# Patient Record
Sex: Female | Born: 1970 | Race: Black or African American | Hispanic: No | Marital: Married | Smoking: Never smoker
Health system: Southern US, Community
[De-identification: ages and names within clinical notes are randomized; demographics above are authoritative.]

## PROBLEM LIST (undated history)

## (undated) DIAGNOSIS — G35 Multiple sclerosis: Secondary | ICD-10-CM

## (undated) DIAGNOSIS — S83106A Unspecified dislocation of unspecified knee, initial encounter: Secondary | ICD-10-CM

## (undated) DIAGNOSIS — S82891A Other fracture of right lower leg, initial encounter for closed fracture: Secondary | ICD-10-CM

## (undated) DIAGNOSIS — Z90710 Acquired absence of both cervix and uterus: Secondary | ICD-10-CM

## (undated) DIAGNOSIS — G43909 Migraine, unspecified, not intractable, without status migrainosus: Secondary | ICD-10-CM

## (undated) HISTORY — DX: Acquired absence of both cervix and uterus: Z90.710

## (undated) HISTORY — DX: Unspecified dislocation of unspecified knee, initial encounter: S83.106A

## (undated) HISTORY — PX: MYOMECTOMY: SHX85

## (undated) HISTORY — DX: Multiple sclerosis: G35

## (undated) HISTORY — PX: ABDOMINAL HYSTERECTOMY: SHX81

## (undated) HISTORY — DX: Other fracture of right lower leg, initial encounter for closed fracture: S82.891A

## (undated) HISTORY — DX: Migraine, unspecified, not intractable, without status migrainosus: G43.909

---

## 2011-08-21 ENCOUNTER — Emergency Department (INDEPENDENT_AMBULATORY_CARE_PROVIDER_SITE_OTHER)
Admission: EM | Admit: 2011-08-21 | Discharge: 2011-08-21 | Disposition: A | Discharge status: Home / Self Care | Payer: Self-pay | Source: Home / Self Care | Attending: Emergency Medicine | Admitting: Emergency Medicine

## 2011-08-21 ENCOUNTER — Encounter (HOSPITAL_COMMUNITY): Payer: Self-pay | Admitting: *Deleted

## 2011-08-21 DIAGNOSIS — S139XXA Sprain of joints and ligaments of unspecified parts of neck, initial encounter: Secondary | ICD-10-CM

## 2011-08-21 DIAGNOSIS — S161XXA Strain of muscle, fascia and tendon at neck level, initial encounter: Secondary | ICD-10-CM

## 2011-08-21 MED ORDER — MELOXICAM 7.5 MG PO TABS: 7.5000 mg | ORAL_TABLET | Freq: Every day | ORAL | Status: AC

## 2011-08-21 MED ORDER — CYCLOBENZAPRINE HCL 10 MG PO TABS: 10.0000 mg | ORAL_TABLET | Freq: Two times a day (BID) | ORAL | Status: AC | PRN

## 2011-08-21 NOTE — Discharge Instructions (Signed)
If pain persists beyond 2 weeks followup with the orthopedic Dr. for guided physical therapy. Start with muscle relaxer as discussed B. precautions for potential side effects as explained   Cervical Sprain A cervical sprain is an injury in the neck in which the ligaments are stretched or torn. The ligaments are the tissues that hold the bones of the neck (vertebrae) in place.Cervical sprains can range from very mild to very severe. Most cervical sprains get better in 1 to 3 weeks, but it depends on the cause and extent of the injury. Severe cervical sprains can cause the neck vertebrae to be unstable. This can lead to damage of the spinal cord and can result in serious nervous system problems. Your caregiver will determine whether your cervical sprain is mild or severe. CAUSES  Severe cervical sprains may be caused by:  Contact sport injuries (football, rugby, wrestling, hockey, auto racing, gymnastics, diving, martial arts, boxing).   Motor vehicle collisions.   Whiplash injuries. This means the neck is forcefully whipped backward and forward.   Falls.  Mild cervical sprains may be caused by:   Awkward positions, such as cradling a telephone between your ear and shoulder.   Sitting in a chair that does not offer proper support.   Working at a poorly Marketing executive station.   Activities that require looking up or down for long periods of time.  SYMPTOMS   Pain, soreness, stiffness, or a burning sensation in the front, back, or sides of the neck. This discomfort may develop immediately after injury or it may develop slowly and not begin for 24 hours or more after an injury.   Pain or tenderness directly in the middle of the back of the neck.   Shoulder or upper back pain.   Limited ability to move the neck.   Headache.   Dizziness.   Weakness, numbness, or tingling in the hands or arms.   Muscle spasms.   Difficulty swallowing or chewing.   Tenderness and swelling of  the neck.  DIAGNOSIS  Most of the time, your caregiver can diagnose this problem by taking your history and doing a physical exam. Your caregiver will ask about any known problems, such as arthritis in the neck or a previous neck injury. X-rays may be taken to find out if there are any other problems, such as problems with the bones of the neck. However, an X-ray often does not reveal the full extent of a cervical sprain. Other tests such as a computed tomography (CT) scan or magnetic resonance imaging (MRI) may be needed. TREATMENT  Treatment depends on the severity of the cervical sprain. Mild sprains can be treated with rest, keeping the neck in place (immobilization), and pain medicines. Severe cervical sprains need immediate immobilization and an appointment with an orthopedist or neurosurgeon. Several treatment options are available to help with pain, muscle spasms, and other symptoms. Your caregiver may prescribe:  Medicines, such as pain relievers, numbing medicines, or muscle relaxants.   Physical therapy. This can include stretching exercises, strengthening exercises, and posture training. Exercises and improved posture can help stabilize the neck, strengthen muscles, and help stop symptoms from returning.   A neck collar to be worn for short periods of time. Often, these collars are worn for comfort. However, certain collars may be worn to protect the neck and prevent further worsening of a serious cervical sprain.  HOME CARE INSTRUCTIONS   Put ice on the injured area.   Put ice in a plastic  bag.   Place a towel between your skin and the bag.   Leave the ice on for 15 to 20 minutes, 3 to 4 times a day.   Only take over-the-counter or prescription medicines for pain, discomfort, or fever as directed by your caregiver.   Keep all follow-up appointments as directed by your caregiver.   Keep all physical therapy appointments as directed by your caregiver.   If a neck collar is  prescribed, wear it as directed by your caregiver.   Do not drive while wearing a neck collar.   Make any needed adjustments to your work station to promote good posture.   Avoid positions and activities that make your symptoms worse.   Warm up and stretch before being active to help prevent problems.  SEEK MEDICAL CARE IF:   Your pain is not controlled with medicine.   You are unable to decrease your pain medicine over time as planned.   Your activity level is not improving as expected.  SEEK IMMEDIATE MEDICAL CARE IF:   You develop any bleeding, stomach upset, or signs of an allergic reaction to your medicine.   Your symptoms get worse.   You develop new, unexplained symptoms.   You have numbness, tingling, weakness, or paralysis in any part of your body.  MAKE SURE YOU:   Understand these instructions.   Will watch your condition.   Will get help right away if you are not doing well or get worse.  Document Released: 01/27/2007 Document Revised: 03/21/2011 Document Reviewed: 01/02/2011 [facility] Patient Information 2012 [place], [place].Ligament Sprain Ligaments are tough, fibrous tissues that hold bones together at the joints. A sprain can occur when a ligament is stretched. This injury may take several weeks to heal. HOME CARE INSTRUCTIONS   Rest the injured area for as long as directed by your caregiver. Then slowly start using the joint as directed by your caregiver and as the pain allows.   Keep the affected joint raised if possible to lessen swelling.   Apply ice for 15 to 20 minutes to the injured area every couple hours for the first half day, then 3 to 4 times per day for the first 48 hours. Put the ice in a plastic bag and place a towel between the bag of ice and your skin.   Wear any splinting, casting, or elastic bandage applications as instructed.   Only take over-the-counter or prescription medicines for pain, discomfort, or fever as directed by your  caregiver. Do not use aspirin immediately after the injury unless instructed by your caregiver. Aspirin can cause increased bleeding and bruising of the tissues.   If you were given crutches, continue to use them as instructed and do not resume weight bearing on the affected extremity until instructed.  SEEK MEDICAL CARE IF:   Your bruising, swelling, or pain increases.   You have cold and numb fingers or toes if your arm or leg was injured.  SEEK IMMEDIATE MEDICAL CARE IF:   Your toes are numb or blue if your leg was injured.   Your fingers are numb or blue if your arm was injured.   Your pain is not responding to medicines and continues to stay the same or gets worse.  MAKE SURE YOU:   Understand these instructions.   Will watch your condition.   Will get help right away if you are not doing well or get worse.  Document Released: 03/29/2000 Document Revised: 03/21/2011 Document Reviewed: 01/26/2008 [employer] Patient Information  [address] [place].
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
## 2012-01-02 ENCOUNTER — Encounter: Payer: Self-pay | Admitting: Obstetrics & Gynecology

## 2012-01-02 ENCOUNTER — Ambulatory Visit (INDEPENDENT_AMBULATORY_CARE_PROVIDER_SITE_OTHER): Payer: Self-pay | Admitting: Obstetrics & Gynecology

## 2012-01-02 VITALS — BP 135/87 | HR 66 | Temp 98.0°F | Ht 66.0 in | Wt 190.5 lb

## 2012-01-02 DIAGNOSIS — N949 Unspecified condition associated with female genital organs and menstrual cycle: Secondary | ICD-10-CM

## 2012-01-02 DIAGNOSIS — R102 Pelvic and perineal pain: Secondary | ICD-10-CM

## 2012-01-02 NOTE — Progress Notes (Signed)
Patient ID: Dana Duke, female   DOB: 04-18-1970, 41 y.o.   MRN: [id number]  CC: pelvic pain  HPI:      Patient presents c/o left sided pelvic pain that has been occuring over the past 3 months.  States that she occasionally experiences a sharp pain in her left pelvic region.  Pain is intermittent, episodes occuring variably and lasting about 10 minutes in duration.  Patient states that the pain does not occur with anything in particular but sometimes she does experience dyspareunia.  Patient has history of hysterectomy as well as significant history of abdominal surgery.  Patient also has had a few UTIs over the past few months.  Currently she is experiencing increase in urinary frequency but no dysuria, or foul-smelling urine.  Has had one episode of hematuria about 1 month ago.  Nothing since.  No history of hematuria mentioned.  Denies any recent fever, illness, or unexplained change in weight.  No change to bowel habitus.  PMH:    History reviewed. No pertinent past medical history.  MEDS:   Current outpatient prescriptions:HYDROcodone-acetaminophen (NORCO) 7.5-325 MG per tablet, Take 1 tablet by mouth every 6 (six) hours as needed., Disp: , Rfl: ;  meloxicam (MOBIC) 7.5 MG tablet, Take 1 tablet (7.5 mg total) by mouth daily., Disp: 14 tablet, Rfl: 0  Allergies:  No Known Allergies  ROS: See HPI; all other ROS are negative   Physical Exam  BP 135/87  Pulse 66  Temp 98 F (36.7 C)  Ht 5\' 6"  (1.676 m)  Wt 86.41 kg (190 lb 8 oz)  BMI 30.75 kg/m2  General - WD, WN AA female in NAD sitting comfortably on examination table  Heart - RRR; S1/S2 distinct without murmur; no S3/S4; no rubs, clicks or gallops  Lungs - CTAB; no use of accessory muscles noted  Abdomen - BS +; S/NT/ND Pelvic - no masses noted with bimanual examination.  Moderate tenderness elicited with examination.  Labs:  -- Urinalysis  Assessment/Plan:  -- Pelvic Pain        -- no anomalies found on examination.   Pain could be due to repeat UTI or secondary to adhesions given surgical history and scarring.  Will follow-up with [facility] for UTI.  If pain continues or worsens, further workup may be needed.  -- Patient to return to clinic if symptoms change or worsen.   Attestation of Attending Supervision of Physician Assistant Student: Evaluation and management procedures were performed by the PA Student under my supervision.  I have seen and examined the patient, reviewed the the student's note and chart, and I agree with management and plan.  Jaynie Collins, MD, FACOG Attending Obstetrician & Gynecologist Faculty Practice, [facility]

## 2012-01-02 NOTE — Patient Instructions (Signed)
Return to clinic for any scheduled appointments or for any gynecologic concerns as needed.   

## 2012-01-03 ENCOUNTER — Other Ambulatory Visit: Payer: Self-pay | Admitting: Medical

## 2012-01-03 DIAGNOSIS — N390 Urinary tract infection, site not specified: Secondary | ICD-10-CM

## 2012-01-03 LAB — POCT URINALYSIS DIP (DEVICE)
Glucose, UA: NEGATIVE mg/dL
Nitrite: NEGATIVE
Specific Gravity, Urine: 1.01 (ref 1.005–1.030)
Urobilinogen, UA: 0.2 mg/dL (ref 0.0–1.0)

## 2012-01-03 MED ORDER — SULFAMETHOXAZOLE-TRIMETHOPRIM 800-160 MG PO TABS: 1.0000 | ORAL_TABLET | Freq: Two times a day (BID) | ORAL | Status: DC

## 2012-01-31 ENCOUNTER — Other Ambulatory Visit (HOSPITAL_COMMUNITY): Payer: Self-pay | Admitting: Internal Medicine

## 2012-01-31 DIAGNOSIS — Z1231 Encounter for screening mammogram for malignant neoplasm of breast: Secondary | ICD-10-CM

## 2012-02-17 ENCOUNTER — Ambulatory Visit (HOSPITAL_COMMUNITY)
Admission: RE | Admit: 2012-02-17 | Discharge: 2012-02-17 | Disposition: A | Discharge status: Home / Self Care | Payer: Self-pay | Source: Ambulatory Visit | Attending: Internal Medicine | Admitting: Internal Medicine

## 2012-02-17 DIAGNOSIS — Z1231 Encounter for screening mammogram for malignant neoplasm of breast: Secondary | ICD-10-CM

## 2013-02-19 ENCOUNTER — Encounter: Payer: Self-pay | Admitting: Neurology

## 2013-02-22 ENCOUNTER — Encounter (INDEPENDENT_AMBULATORY_CARE_PROVIDER_SITE_OTHER): Payer: Self-pay

## 2013-02-22 ENCOUNTER — Ambulatory Visit (INDEPENDENT_AMBULATORY_CARE_PROVIDER_SITE_OTHER): Payer: BC Managed Care – PPO | Admitting: Neurology

## 2013-02-22 ENCOUNTER — Encounter: Payer: Self-pay | Admitting: Neurology

## 2013-02-22 VITALS — BP 122/81 | HR 81 | Ht 66.0 in | Wt 177.0 lb

## 2013-02-22 DIAGNOSIS — R269 Unspecified abnormalities of gait and mobility: Secondary | ICD-10-CM
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
## 2013-05-05 ENCOUNTER — Ambulatory Visit: Payer: BC Managed Care – PPO | Admitting: Neurology

## 2013-06-16 ENCOUNTER — Ambulatory Visit: Payer: BC Managed Care – PPO | Admitting: Neurology

## 2013-08-18 ENCOUNTER — Other Ambulatory Visit: Payer: 59

## 2013-08-19 ENCOUNTER — Telehealth: Payer: Self-pay | Admitting: Neurology

## 2013-08-19 ENCOUNTER — Ambulatory Visit (INDEPENDENT_AMBULATORY_CARE_PROVIDER_SITE_OTHER): Payer: 59

## 2013-08-19 DIAGNOSIS — R269 Unspecified abnormalities of gait and mobility: Secondary | ICD-10-CM

## 2013-08-19 NOTE — Telephone Encounter (Signed)
Patient at check out today states that she would like to know the results of her MRI today before she goes out of town next Wednesday (she will be gone for about a month or more). Please call and advise patient.

## 2013-08-20 NOTE — Telephone Encounter (Signed)
Scheduled for f/u appt, informed patient that results were not ready as of yet, she verbalized understanding and confirmed appt

## 2013-08-23 ENCOUNTER — Ambulatory Visit (INDEPENDENT_AMBULATORY_CARE_PROVIDER_SITE_OTHER): Payer: 59

## 2013-08-23 ENCOUNTER — Encounter: Payer: Self-pay | Admitting: Neurology

## 2013-08-23 ENCOUNTER — Ambulatory Visit (INDEPENDENT_AMBULATORY_CARE_PROVIDER_SITE_OTHER): Payer: 59 | Admitting: Neurology

## 2013-08-23 ENCOUNTER — Telehealth: Payer: Self-pay | Admitting: Neurology

## 2013-08-23 VITALS — BP 188/73 | HR 68 | Ht 66.0 in | Wt 189.0 lb

## 2013-08-23 DIAGNOSIS — G35 Multiple sclerosis: Secondary | ICD-10-CM

## 2013-08-23 NOTE — Progress Notes (Signed)
[facility]  PATIENT: Dana Duke DOB: 11/19/1970  HISTORICAL (initial visit was in Nov 2014) Dana Duke is a 43 years old right-handed African American female, referred by Dr. Suann Larryevesahwar, Janalyn RouseShaili for evaluation of gait difficulties,  She had past medical history of hyperlipidemia, over the past 3 years, since 2011 she noticed gradual onset still worsening gait difficulty, she tends to drag her foot across the floor, stiff, has difficulty going upstairs, because of knee pain, she suffered a history of right ankle fracture in 2010, seems to dragging her right foot across the floor more,  She has bilateral knee pain, hip pain, but she denies significant low back pain, no neck pain, she has urinary urgency, no bowel incontinence  UPDATE May 11th 2015:  She was delayed in getting her MRI since her last visit in November 2014, she continued to complains of slow worsening gait difficulty, urinary urgency, bilateral blurry vision  We have reviewed the MRI together which was done at [facility] in May 7th 2015, without contrast  Abnormal MRI cervical spine (without) demonstrating at least 4 spinal cord T2 hyperintensities at cervico-medullary junction, C2, C3-4 and C6. In combination with MRI brain findings, these most likely represent demyelinating plaques  MRI brain (without) demonstrating multiple (> 20) round and ovoid, periventricular, callosal and peri-callosal, subcortical, juxtacortical, pontine and cerebellar T2 hyperintensities. Some of these are perpendicular to the lateral ventricles on axial and sagittal views. Some of these are hypointense on T1 views. Findings suspicious for demyelinating disease  The findings, and progressive worsening neurological complaints, are most consistent with multiple sclerosis, Most likely relapsing remitting   REVIEW OF SYSTEMS: Full 14 system review of systems performed and notable only for joint pain, headache, numbness,  weakness, restless leg decreased energy,  ALLERGIES: No Known Allergies  HOME MEDICATIONS: Outpatient Prescriptions Prior to Visit  Medication Sig Dispense Refill  . Cholecalciferol (VITAMIN D PO) Take by mouth.      . DICLOFENAC PO Take by mouth.      Marland Kitchen. HYDROcodone-acetaminophen (NORCO) 7.5-325 MG per tablet Take 1 tablet by mouth every 6 (six) hours as needed.      . sulfamethoxazole-trimethoprim (BACTRIM DS,SEPTRA DS) 800-160 MG per tablet Take 1 tablet by mouth 2 (two) times daily.  10 tablet  0  . Vitamin D, Ergocalciferol, (DRISDOL) 50000 UNITS CAPS capsule        No facility-administered medications prior to visit.    PAST MEDICAL HISTORY: Past Medical History  Diagnosis Date  . Migraines   . Ankle fracture, right   . Knee joint dislocation     bilateral  . History of hysterectomy     PAST SURGICAL HISTORY: Past Surgical History  Procedure Laterality Date  . Abdominal hysterectomy    . Myomectomy    . Cesarean section      FAMILY HISTORY: Family History  Problem Relation Age of Onset  . Diabetes Mother   . Osteoarthritis    . Heart disease    . Rheumatic fever    . Hypertension      SOCIAL HISTORY:  History   Social History  . Marital Status: Married    Spouse Name: Loraine LericheMark    Number of Children: 1  . Years of Education: college   Occupational History  .      Does not work   Social History Main Topics  . Smoking status: Never Smoker   . Smokeless tobacco: Never Used  . Alcohol Use: 0.6 oz/week  1 Glasses of wine per week     Comment: On Holidays  . Drug Use: No  . Sexual Activity: Yes    Birth Control/ Protection: Surgical   Other Topics Concern  . Not on file   Social History Narrative   Patient is married. [name]).    Patient is unemployed.   Education- College education   Right handed.   Caffeine- None     PHYSICAL EXAM   Filed Vitals:   08/23/13 1135  BP: 188/73  Pulse: 68  Height: 5\' 6"  (1.676 m)  Weight: 189 lb (85.73  kg)   Body mass index is 30.52 kg/(m^2).   Generalized: In no acute distress  Neck: Supple, no carotid bruits   Cardiac: Regular rate rhythm  Pulmonary: Clear to auscultation bilaterally  Musculoskeletal: No deformity  Neurological examination  Mentation: Alert oriented to time, place, history taking, and causual conversation  Cranial nerve II-XII: Pupils were equal round reactive to light extraocular movements were full, visual field were full on confrontational test. facial sensation and strength were normal. hearing was intact to finger rubbing bilaterally. Uvula tongue midline.  head turning and shoulder shrug and were normal and symmetric.Tongue protrusion into cheek strength was normal.  Motor: Moderate bilateral lower extremity spasticity, left hip flexion 4 knee flexion 4 plus, .  Sensory: Intact to fine touch, pinprick, preserved vibratory sensation, and proprioception at toes.  Coordination: Normal finger to nose, heel-to-shin bilaterally there was no truncal ataxia  Gait: Rising up from seated position without assistance, normal stance, mild wide based stiff gait, cautious, mild difficulty with tiptoe, heel and tandem walking Romberg signs: Negative  Deep tendon reflexes: Brachioradialis 2/2, biceps 2/2, triceps 2/2, patellar 3/3, Achilles 2/2, plantar responses were flexor bilaterally.   DIAGNOSTIC DATA (LABS, IMAGING, TESTING) - I reviewed patient records, labs, notes, testing and imaging myself where available.  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 Visual evoked  potential 5, return to clinic in one mont   Levert Feinstein, M.D. Ph.D.  [facility] [address] [place], [place] [address] [phone]

## 2013-08-23 NOTE — Procedures (Signed)
    History:   Denna HaggardDesrene Erichsen is a 43 year old patient with a progressive history of a gait disturbance. There is a history of an abnormal MRI the brain, and the patient being evaluated for possible demyelinating disease.  Description: The visual evoked response test was performed today using 32 x 32 check sizes. The absolute latencies for the N1 and the P100 wave forms were within normal limits on the left, with prolongation of the P100 latency on the right. The right N1 waveform is of normal latency. The amplitudes for the P100 wave forms were within normal limits bilaterally. The visual acuity was 20/30 OD and 20/30 OS corrected.  Impression:  The visual evoked response test above was within normal limits on the left, with prolongation of the P100 latency on the right. This study suggests conduction slowing in the anterior visual pathways on the right, and is consistent with an ischemic or demyelinating optic neuropathy. Clinical correlation is required.

## 2013-08-23 NOTE — Telephone Encounter (Signed)
Patient wants to know if she should still be seeing her Rheumatologist now that she has been diagnosed. Pt states she is leaving the country Wed. 5/13 and would like an answer before than.

## 2013-08-25 LAB — CBC WITH DIFFERENTIAL
Basophils Absolute: 0 10*3/uL (ref 0.0–0.2)
Basos: 0 %
EOS: 1 %
Eosinophils Absolute: 0.1 10*3/uL (ref 0.0–0.4)
HCT: 38.4 % (ref 34.0–46.6)
Hemoglobin: 12.3 g/dL (ref 11.1–15.9)
IMMATURE GRANS (ABS): 0 10*3/uL (ref 0.0–0.1)
IMMATURE GRANULOCYTES: 0 %
Lymphocytes Absolute: 2.2 10*3/uL (ref 0.7–3.1)
Lymphs: 31 %
MCH: 27.8 pg (ref 26.6–33.0)
MCHC: 32 g/dL (ref 31.5–35.7)
MCV: 87 fL (ref 79–97)
MONOCYTES: 5 %
Monocytes Absolute: 0.3 10*3/uL (ref 0.1–0.9)
NEUTROS PCT: 63 %
Neutrophils Absolute: 4.3 10*3/uL (ref 1.4–7.0)
PLATELETS: 275 10*3/uL (ref 150–379)
RBC: 4.42 x10E6/uL (ref 3.77–5.28)
RDW: 14.9 % (ref 12.3–15.4)
WBC: 6.9 10*3/uL (ref 3.4–10.8)

## 2013-08-25 LAB — COMPREHENSIVE METABOLIC PANEL
A/G RATIO: 1.4 (ref 1.1–2.5)
ALBUMIN: 4.3 g/dL (ref 3.5–5.5)
ALT: 10 IU/L (ref 0–32)
AST: 14 IU/L (ref 0–40)
Alkaline Phosphatase: 43 IU/L (ref 39–117)
BILIRUBIN TOTAL: 0.2 mg/dL (ref 0.0–1.2)
BUN/Creatinine Ratio: 16 (ref 9–23)
BUN: 11 mg/dL (ref 6–24)
CO2: 20 mmol/L (ref 18–29)
CREATININE: 0.68 mg/dL (ref 0.57–1.00)
Calcium: 9.2 mg/dL (ref 8.7–10.2)
Chloride: 100 mmol/L (ref 97–108)
GFR, EST AFRICAN AMERICAN: 123 mL/min/{1.73_m2} (ref >59)
GFR, EST NON AFRICAN AMERICAN: 107 mL/min/{1.73_m2} (ref >59)
GLOBULIN, TOTAL: 3 g/dL (ref 1.5–4.5)
GLUCOSE: 95 mg/dL (ref 65–99)
Potassium: 4.2 mmol/L (ref 3.5–5.2)
Sodium: 137 mmol/L (ref 134–144)
TOTAL PROTEIN: 7.3 g/dL (ref 6.0–8.5)

## 2013-08-25 LAB — IFE AND PE, SERUM
ALBUMIN SERPL ELPH-MCNC: 3.8 g/dL (ref 3.2–5.6)
ALPHA 1: 0.2 g/dL (ref 0.1–0.4)
ALPHA2 GLOB SERPL ELPH-MCNC: 0.6 g/dL (ref 0.4–1.2)
Albumin/Glob SerPl: 1.1 (ref 0.7–2.0)
B-Globulin SerPl Elph-Mcnc: 1 g/dL (ref 0.6–1.3)
GAMMA GLOB SERPL ELPH-MCNC: 1.7 g/dL — AB (ref 0.5–1.6)
Globulin, Total: 3.5 g/dL (ref 2.0–4.5)
IGG (IMMUNOGLOBIN G), SERUM: 1719 mg/dL — AB (ref 700–1600)
IgA/Immunoglobulin A, Serum: 203 mg/dL (ref 91–414)
IgM (Immunoglobulin M), Srm: 265 mg/dL — ABNORMAL HIGH (ref 40–230)

## 2013-08-25 LAB — SEDIMENTATION RATE: Sed Rate: 16 mm/hr (ref 0–32)

## 2013-08-25 LAB — FOLATE: Folate: 18.7 ng/mL (ref >3.0)

## 2013-08-25 LAB — THYROID PANEL WITH TSH
FREE THYROXINE INDEX: 1.8 (ref 1.2–4.9)
T3 UPTAKE RATIO: 32 % (ref 24–39)
T4 TOTAL: 5.6 ug/dL (ref 4.5–12.0)
TSH: 1.04 u[IU]/mL (ref 0.450–4.500)

## 2013-08-25 LAB — CK: Total CK: 259 U/L — ABNORMAL HIGH (ref 24–173)

## 2013-08-25 LAB — NMO IGG AUTOANTIBODIES: NMO-IgG: <1.6 U/mL (ref 0.0–3.0)

## 2013-08-25 LAB — LYME, TOTAL AB TEST/REFLEX: Lyme IgG/IgM Ab: <0.91 {ISR} (ref 0.00–0.90)

## 2013-08-25 LAB — VITAMIN B12: Vitamin B-12: 314 pg/mL (ref 211–946)

## 2013-08-25 LAB — HIV ANTIBODY (ROUTINE TESTING W REFLEX): HIV-1/HIV-2 Ab: NONREACTIVE

## 2013-08-25 LAB — C-REACTIVE PROTEIN: CRP: 0.8 mg/L (ref 0.0–4.9)

## 2013-08-25 LAB — RPR: RPR: NONREACTIVE

## 2013-08-25 LAB — COPPER, SERUM: COPPER: 95 ug/dL (ref 72–166)

## 2013-08-25 LAB — VARICELLA ZOSTER ANTIBODY, IGG: Varicella zoster IgG: 534 index (ref >165)

## 2013-08-25 LAB — ANA W/REFLEX IF POSITIVE: ANA: NEGATIVE

## 2013-08-27 NOTE — Telephone Encounter (Signed)
Dana:  Please call patient it is ok to postphone Rheumatology appt, but she needs to come back with me to review MRI together, within 2-4 weeks.

## 2013-08-31 NOTE — Telephone Encounter (Signed)
Called and spoke to patients daughter . Patient's daughter stated her mother is out of the country until the end of June. Patient daughter will deliver message. About Rheumatology appt. Marland Kitchen

## 2013-09-02 ENCOUNTER — Telehealth: Payer: Self-pay | Admitting: Neurology

## 2013-09-02 NOTE — Telephone Encounter (Signed)
Dana: Please make sure, that she has a followup appointment with us at MRI, and CSF study in one to 2 weeks

## 2013-09-03 NOTE — Telephone Encounter (Signed)
Dr.Yan Patient is out of the country until being ing of July. Spoke to patients husband and he will have her call to schedule follow up in July with you.

## 2013-09-29 ENCOUNTER — Telehealth: Payer: Self-pay | Admitting: *Deleted

## 2013-09-30 NOTE — Telephone Encounter (Signed)
Pt's information was given to Annabelle Harman, [place], Dr. Zannie Cove assistant.

## 2013-10-04 ENCOUNTER — Telehealth: Payer: Self-pay

## 2013-10-04 NOTE — Telephone Encounter (Signed)
Patient has been out of the Country when Dr.Yan wanted her to Have Lumbar Puncture. Patient is back and ready to schedule. Sent order's and notes to [facility].

## 2013-10-04 NOTE — Telephone Encounter (Signed)
Patient has been out of the country and she is ready to schedule LP.

## 2013-10-06 ENCOUNTER — Ambulatory Visit
Admission: RE | Admit: 2013-10-06 | Discharge: 2013-10-06 | Disposition: A | Discharge status: Home / Self Care | Payer: 59 | Source: Ambulatory Visit | Attending: Neurology | Admitting: Neurology

## 2013-10-06 VITALS — BP 102/50 | HR 55

## 2013-10-06 DIAGNOSIS — R269 Unspecified abnormalities of gait and mobility: Secondary | ICD-10-CM

## 2013-10-06 DIAGNOSIS — G35 Multiple sclerosis: Secondary | ICD-10-CM

## 2013-10-06 LAB — GRAM STAIN
GRAM STAIN: NONE SEEN
Gram Stain: NONE SEEN

## 2013-10-06 NOTE — Discharge Instructions (Signed)

## 2013-10-06 NOTE — Progress Notes (Addendum)
Blood drawn from left AC. Site is unremarkable and pt tolerated procedure well. Discharge instructions explained to pt.

## 2013-10-08 ENCOUNTER — Telehealth: Payer: Self-pay | Admitting: Neurology

## 2013-10-08 LAB — CSF PANEL 1
GLUCOSE CSF: 78 mg/dL — AB (ref 43–76)
RBC Count, CSF: 0 cu mm
Total Protein, CSF: 36 mg/dL (ref 15–45)
Tube #: 4
VDRL Quant, CSF: NONREACTIVE
WBC, CSF: 2 cu mm (ref 0–5)

## 2013-10-08 NOTE — Telephone Encounter (Signed)
Dana Duke, please give her a follow up visit in my next available, make sure appt is after all the workups that was planned during last visit.

## 2013-10-10 LAB — MULTIPLE SCLEROSIS PANEL 2
ALBUMIN MSPROF: 4130 mg/dL (ref 3700–5410)
Albumin CSF: 17 mg/dL (ref <35)
Albumin Index: 4.1 (ref <9.0)
CNS-IgG Synthesis Rate: 11 mg/24hr — ABNORMAL HIGH (ref <3.3)
IgA CSF: 0.17 mg/dL (ref 0.15–0.60)
IgA Total: 191 mg/dL (ref 81–463)
IgG Total CSF: 6.7 mg/dL — ABNORMAL HIGH (ref 0.5–6.1)
IgG Total: 1722 mg/dL — ABNORMAL HIGH (ref 694–1618)
IgG-Index: 0.95 — ABNORMAL HIGH (ref <0.70)
IgG: 2.01 mg/dL — ABNORMAL HIGH (ref <0.10)
IgM MSPROF: <0.001 mg/dL (ref <0.010)
IgM Total: 261 mg/dL (ref 48–271)
IgM-CSF: 0.15 mg/dL — ABNORMAL HIGH (ref <0.10)

## 2013-10-11 NOTE — Telephone Encounter (Signed)
Because of patients insurance she has to get from her PCP for MRI's Dana Duke is handling this with patient.

## 2013-10-11 NOTE — Telephone Encounter (Signed)
Dr.Yan she has had all her test done. But her MRI and because the way her insurance is saying her PCP has to be the one to order MRI Luster LandsbergRenee is aware this is going on. Do you want me to schedule follow up any way even though she has not had MRI yet ? Please advise.

## 2013-10-11 NOTE — Telephone Encounter (Signed)
Abnormal CSF. Dana Duke, please make sure that she has a follow up appt with me, will go over results at her follow up visit.

## 2013-10-12 NOTE — Telephone Encounter (Signed)
Yes, let her come back for follow up with out MRI done. In my next available.

## 2013-10-13 NOTE — Telephone Encounter (Signed)
Called and spoke to patient. Patient has a follow up appt. With Dr.Yan Next week.

## 2013-10-18 ENCOUNTER — Encounter: Payer: Self-pay | Admitting: Neurology

## 2013-10-18 ENCOUNTER — Ambulatory Visit (INDEPENDENT_AMBULATORY_CARE_PROVIDER_SITE_OTHER): Payer: 59 | Admitting: *Deleted

## 2013-10-18 ENCOUNTER — Ambulatory Visit (INDEPENDENT_AMBULATORY_CARE_PROVIDER_SITE_OTHER): Payer: 59 | Admitting: Neurology

## 2013-10-18 VITALS — BP 124/76 | HR 69 | Ht 65.0 in | Wt 190.0 lb

## 2013-10-18 DIAGNOSIS — G35 Multiple sclerosis: Secondary | ICD-10-CM

## 2013-10-18 DIAGNOSIS — R269 Unspecified abnormalities of gait and mobility: Secondary | ICD-10-CM

## 2013-10-18 NOTE — Patient Instructions (Signed)
Dana Duke

## 2013-10-18 NOTE — Progress Notes (Signed)
Pt taken to lab area.  Instructed that Dr. Terrace Arabia wanting JCV lab, (did not get last time she was in May/2015).  Under aseptic technique 23g butterfly inserted and blood drawn to appropriate tube.  Needle withdrawn, pressure applied and bandage applied.   Pt ok, and proceeded to check out.

## 2013-10-18 NOTE — Progress Notes (Addendum)
[facility]  PATIENT: Dana Duke DOB: 12/13/1970  HISTORICAL (initial visit was in Nov 2014) Dana Duke is a 43 years old right-handed African American female, referred by Dr. Suann Larry, Janalyn Rouse for evaluation of gait difficulties,  She had past medical history of hyperlipidemia, over the past 3 years, since 2011 she noticed gradual onset still worsening gait difficulty, she tends to drag her foot across the floor, stiff, has difficulty going upstairs, because of knee pain, she suffered a history of right ankle fracture in 2010, seems to dragging her right foot across the floor more,  She has bilateral knee pain, hip pain, but she denies significant low back pain, no neck pain, she has urinary urgency, no bowel incontinence  UPDATE May 11th 2015:  She was delayed in getting her MRI, since her last visit in November 2014, she continued to complains of slow worsening gait difficulty, urinary urgency, bilateral blurry vision  We have reviewed the MRI together which was done at [facility] in May 7th 2015, without contrast  Abnormal MRI cervical spine (without) demonstrating at least 4 spinal cord T2 hyperintensities at cervico-medullary junction, C2, C3-4 and C6. In combination with MRI brain findings, these most likely represent demyelinating plaques  MRI brain (without) demonstrating multiple (> 20) round and ovoid, periventricular, callosal and peri-callosal, subcortical, juxtacortical, pontine and cerebellar T2 hyperintensities. Some of these are perpendicular to the lateral ventricles on axial and sagittal views. Some of these are hypointense on T1 views. Findings suspicious for demyelinating disease  The findings, and progressive worsening neurological complaints, are most consistent with multiple sclerosis, Most likely relapsing remitting   UPDATE July 6th 2015; She is with her daughter at today's clinical visit, she continues to have gait difficulty.  She did  not had MRI of brain with contrast yet, but it has been scheduled, we have reviewed previous MRI of the brain, and cervical spine without contrast again, findings are consistent with relapsing remitting multiple sclerosis,  CSF showed 5 oligoclonal banding, elevated IgG index   Extensive laboratory evaluations showed normal or negative CBC, CMP, Lyme titer, ANA, NMO, TSH, B12, folic acid, RPR. Positive varicellar zoster antibody  The visual evoked response test above was within normal limits on the left, with prolongation of the P100 latency on the right.    REVIEW OF SYSTEMS: Full 14 system review of systems performed and notable only for loss of vision.  ALLERGIES: No Known Allergies  HOME MEDICATIONS: Outpatient Prescriptions Prior to Visit  Medication Sig Dispense Refill  . Cholecalciferol (VITAMIN D PO) Take by mouth.      . DICLOFENAC PO Take by mouth.      Marland Kitchen HYDROcodone-acetaminophen (NORCO) 7.5-325 MG per tablet Take 10-325 tablets by mouth every 6 (six) hours as needed.       . sulfamethoxazole-trimethoprim (BACTRIM DS,SEPTRA DS) 800-160 MG per tablet Take 1 tablet by mouth 2 (two) times daily.  10 tablet  0  . Vitamin D, Ergocalciferol, (DRISDOL) 50000 UNITS CAPS capsule        No facility-administered medications prior to visit.    PAST MEDICAL HISTORY: Past Medical History  Diagnosis Date  . Migraines   . Ankle fracture, right   . Knee joint dislocation     bilateral  . History of hysterectomy     PAST SURGICAL HISTORY: Past Surgical History  Procedure Laterality Date  . Abdominal hysterectomy    . Myomectomy    . Cesarean section      FAMILY HISTORY: Family  History  Problem Relation Age of Onset  . Diabetes Mother   . Osteoarthritis    . Heart disease    . Rheumatic fever    . Hypertension      SOCIAL HISTORY:  History   Social History  . Marital Status: Married    Spouse Name: Loraine LericheMark    Number of Children: 1  . Years of Education: college    Occupational History  .      Does not work   Social History Main Topics  . Smoking status: Never Smoker   . Smokeless tobacco: Never Used  . Alcohol Use: 0.6 oz/week    1 Glasses of wine per week     Comment: On Holidays  . Drug Use: No  . Sexual Activity: Yes    Birth Control/ Protection: Surgical   Other Topics Concern  . Not on file   Social History Narrative   Patient is married. [name](Mark).    Patient is unemployed.   Education- College education   Right handed.   Caffeine- None     PHYSICAL EXAM   Filed Vitals:   10/18/13 0848  BP: 124/76  Pulse: 69  Height: 5\' 5"  (1.651 m)  Weight: 190 lb (86.183 kg)   Body mass index is 31.62 kg/(m^2).   Generalized: In no acute distress  Neck: Supple, no carotid bruits   Cardiac: Regular rate rhythm  Pulmonary: Clear to auscultation bilaterally  Musculoskeletal: No deformity  Neurological examination  Mentation: Alert oriented to time, place, history taking, and causual conversation  Cranial nerve II-XII: Pupils were equal round reactive to light extraocular movements were full, visual field were full on confrontational test. facial sensation and strength were normal. hearing was intact to finger rubbing bilaterally. Uvula tongue midline.  head turning and shoulder shrug and were normal and symmetric.Tongue protrusion into cheek strength was normal.  Motor: Moderate bilateral lower extremity spasticity, left hip flexion 4 knee flexion 4 plus, .  Sensory: Intact to fine touch, pinprick, preserved vibratory sensation, and proprioception at toes.  Coordination: Normal finger to nose, heel-to-shin bilaterally there was no truncal ataxia  Gait: Rising up from seated position without assistance, normal stance, mild wide based stiff gait, cautious, mild difficulty with tiptoe, heel and tandem walking Romberg signs: Negative  Deep tendon reflexes: Brachioradialis 2/2, biceps 2/2, triceps 2/2, patellar 3/3, Achilles 2/2,  plantar responses were flexor bilaterally.   DIAGNOSTIC DATA (LABS, IMAGING, TESTING) - I reviewed patient records, labs, notes, testing and imaging myself where available.  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 JC virus antibody  2 she will proceed with MRI of the brain and cervical cord with contrast  3.  I have provided treatment options, including Tysbari, Gilenya, Tecfidera.  4. She will return in 1-2 weeks for treatment decision   Levert FeinsteinYijun Sherrice Creekmore, M.D. Ph.D.  [facility] [address], [address] [place], [place] [id number] [phone]  JC virus antibody was 0.15 negative in 10/18/2013

## 2013-10-20 ENCOUNTER — Ambulatory Visit (INDEPENDENT_AMBULATORY_CARE_PROVIDER_SITE_OTHER): Payer: 59

## 2013-10-20 DIAGNOSIS — G35 Multiple sclerosis: Secondary | ICD-10-CM

## 2013-10-20 MED ORDER — GADOPENTETATE DIMEGLUMINE 469.01 MG/ML IV SOLN: 19.0000 mL | Freq: Once | INTRAVENOUS | Status: AC | PRN

## 2013-10-28 ENCOUNTER — Encounter: Payer: Self-pay | Admitting: Neurology

## 2013-10-28 ENCOUNTER — Ambulatory Visit (INDEPENDENT_AMBULATORY_CARE_PROVIDER_SITE_OTHER): Payer: 59 | Admitting: Neurology

## 2013-10-28 VITALS — BP 117/79 | HR 74 | Ht 65.0 in

## 2013-10-28 DIAGNOSIS — R269 Unspecified abnormalities of gait and mobility: Secondary | ICD-10-CM

## 2013-10-28 DIAGNOSIS — R519 Headache, unspecified: Secondary | ICD-10-CM | POA: Insufficient documentation

## 2013-10-28 DIAGNOSIS — R51 Headache: Secondary | ICD-10-CM

## 2013-10-28 DIAGNOSIS — G35 Multiple sclerosis: Secondary | ICD-10-CM

## 2013-10-28 MED ORDER — RIZATRIPTAN BENZOATE 5 MG PO TBDP: 5.0000 mg | ORAL_TABLET | ORAL | Status: DC | PRN

## 2013-10-28 MED ORDER — NORTRIPTYLINE HCL 10 MG PO CAPS: ORAL_CAPSULE | ORAL | Status: DC

## 2013-10-28 NOTE — Progress Notes (Signed)
[facility]  PATIENT: Dana Duke DOB: 07/24/1970  HISTORICAL (initial visit was in Nov 2014) Dana Duke is a 43 years old right-handed African American female, referred by Dr. Suann Larryevesahwar, Janalyn RouseShaili for evaluation of gait difficulties,  Since 2011 she noticed gradual onset still worsening gait difficulty, she tends to drag her foot across the floor, stiff, has difficulty going upstairs, because of knee pain, she suffered a history of right ankle fracture in 2010, seems to dragging her right foot across the floor more,  She has bilateral knee pain, hip pain, but she denies significant low back pain, no neck pain, she has urinary urgency, no bowel incontinence   MRI at [facility] in May 7th 2015, without contrast  Abnormal MRI cervical spine (without) demonstrating at least 4 spinal cord T2 hyperintensities at cervico-medullary junction, C2, C3-4 and C6. In combination with MRI brain findings, these most likely represent demyelinating plaques  MRI brain (without) demonstrating multiple (> 20) round and ovoid, periventricular, callosal and peri-callosal, subcortical, juxtacortical, pontine and cerebellar T2 hyperintensities. Some of these are perpendicular to the lateral ventricles on axial and sagittal views. Some of these are hypointense on T1 views. Findings suspicious for demyelinating disease  The findings, and progressive worsening neurological complaints, are most consistent with multiple sclerosis, Most likely relapsing remitting multiple sclerosis   CSF showed 5 oligoclonal banding, elevated IgG index   Extensive laboratory evaluations showed normal or negative CBC, CMP, Lyme titer, ANA, NMO, TSH, B12, folic acid, RPR. Positive varicellar zoster antibody  The visual evoked response test above was within normal limits on the left, with prolongation of the P100 latency on the right.  JC virus antibody was 0.15 negative in 10/18/2013  UPDATE July 16th 2015:  MRI of  brain, and cervical spine showed no contrast enhancement, there was no lesions noticed MRI thoracic spine,  We have reviewed treatment options along with patient and her husband, pros and cons were discussed, we decided to proceed with Plegridy.  She also complains of few years history of constant bilateral temporal, frontal area headaches, pressure, there was no significant  light, noise sensitivity, 7 out of 10 on daily basis, she denies nauseous   REVIEW OF SYSTEMS: Full 14 system review of systems performed and notable only for blurry vision, gait difficulties  ALLERGIES: No Known Allergies  HOME MEDICATIONS: Outpatient Prescriptions Prior to Visit  Medication Sig Dispense Refill  . ACETAMINOPHEN-BUTALBITAL 50-325 MG TABS Take by mouth as directed.      Marland Kitchen. HYDROcodone-acetaminophen (NORCO) 10-325 MG per tablet Take 1 tablet by mouth every 6 (six) hours as needed.       No facility-administered medications prior to visit.    PAST MEDICAL HISTORY: Past Medical History  Diagnosis Date  . Migraines   . Ankle fracture, right   . Knee joint dislocation     bilateral  . History of hysterectomy     PAST SURGICAL HISTORY: Past Surgical History  Procedure Laterality Date  . Abdominal hysterectomy    . Myomectomy    . Cesarean section      FAMILY HISTORY: Family History  Problem Relation Age of Onset  . Diabetes Mother   . Osteoarthritis    . Heart disease    . Rheumatic fever    . Hypertension      SOCIAL HISTORY:  History   Social History  . Marital Status: Married    Spouse Name: Loraine LericheMark    Number of Children: 1  . Years of Education:  college   Occupational History  .      Does not work   Social History Main Topics  . Smoking status: Never Smoker   . Smokeless tobacco: Never Used  . Alcohol Use: 0.6 oz/week    1 Glasses of wine per week     Comment: On Holidays  . Drug Use: No  . Sexual Activity: Yes    Birth Control/ Protection: Surgical   Other  Topics Concern  . Not on file   Social History Narrative   Patient is married. [name]).    Patient is unemployed.   Education- College education   Right handed.   Caffeine- None     PHYSICAL EXAM   Filed Vitals:   10/28/13 1025  BP: 117/79  Pulse: 74  Height: 5\' 5"  (1.651 m)   There is no weight on file to calculate BMI.   Generalized: In no acute distress  Neck: Supple, no carotid bruits   Cardiac: Regular rate rhythm  Pulmonary: Clear to auscultation bilaterally  Musculoskeletal: No deformity  Neurological examination  Mentation: Alert oriented to time, place, history taking, and causual conversation  Cranial nerve II-XII: Pupils were equal round reactive to light extraocular movements were full, visual field were full on confrontational test. facial sensation and strength were normal. hearing was intact to finger rubbing bilaterally. Uvula tongue midline.  head turning and shoulder shrug and were normal and symmetric.Tongue protrusion into cheek strength was normal.  Motor: Moderate bilateral lower extremity spasticity, left hip flexion 4 knee flexion 4 plus, .  Sensory: Intact to fine touch, pinprick, preserved vibratory sensation, and proprioception at toes.  Coordination: Normal finger to nose, heel-to-shin bilaterally there was no truncal ataxia  Gait: Rising up from seated position without assistance, normal stance, mild wide based stiff gait, cautious, mild difficulty with tiptoe, heel and tandem walking Romberg signs: Negative  Deep tendon reflexes: Brachioradialis 2/2, biceps 2/2, triceps 2/2, patellar 3/3, Achilles 2/2, plantar responses were flexor bilaterally.   DIAGNOSTIC DATA (LABS, IMAGING, TESTING) - I reviewed patient records, labs, notes, testing and imaging myself where available.  ASSESSMENT AND PLAN   43 years old right-handed [place] American female, with 3 years history of unsteady gait, hyperreflexia of bilateral lower extremity on  examination,  marked abnormal MRI of the brain, and cervical spine, most consistent with relapsing remitting multiple sclerosis, also reported a history of constant headaches.  1. I have provided her with treatment options, after discussion with patient's, and her family, we decided to proceed with Plegridy, potential side effect explained. 2. Nortriptyline 10mg  titrating to 20mg  qhs as preventive medications. 3. maxalt as needed. 4. RTC in 3 months.    Levert Feinstein, M.D. Ph.D.  [facility] [address], [address] [place], [place] [address] [phone]

## 2013-11-02 LAB — FUNGUS CULTURE W SMEAR: Smear Result: NONE SEEN

## 2013-11-08 ENCOUNTER — Telehealth: Payer: Self-pay | Admitting: *Deleted

## 2013-11-08 NOTE — Telephone Encounter (Signed)
I called the patient back to clarify message.  She said she had gotten a call from [employer] (Psychologist, counselling), then a call from [facility] (they provide the medication at no cost for the patient through [employer]) and lastly she got a call from [facility], which would be her contracted pharmacy through [employer].  I explained the differences between all three entities.  Patient verbalized understanding.  Says she was told she has been approved for the free drug program through the manufacturer.  She will call us back if anything further is needed.

## 2013-11-09 NOTE — Telephone Encounter (Signed)
I spoke with Marylene LandAngela from [employer].  She verified the patient has been approved for the free drug program effective 11/02/2013-11/03/2014.  States the patient's starter dose was already shipped and they have scheduled a nurse visit for training.  Nothing further is needed at this time.  States [facility] will be handling Rx, and [facility] will not be providing meds for the patient.

## 2013-11-10 ENCOUNTER — Telehealth: Payer: Self-pay | Admitting: Neurology

## 2013-11-10 NOTE — Telephone Encounter (Signed)
I called back.  Spoke with patient.  Advised her to tell [facility] she does not wish to get the prescription from them, and ask that they cancel the order.  She verbalized understanding and will call back if anything further is needed.

## 2013-11-10 NOTE — Telephone Encounter (Signed)
Patient stated [facility] keeps calling her about Rx,  I explained to her [facility] would be handling her Rx shipment per telephone conversation 7/28.  She's questioning what should she tell [facility] due to they call her everyday.  She can be reached anytime today and can leave message.

## 2014-01-24 ENCOUNTER — Telehealth: Payer: Self-pay | Admitting: Neurology

## 2014-01-24 DIAGNOSIS — G450 Vertebro-basilar artery syndrome: Secondary | ICD-10-CM

## 2014-01-24 NOTE — Telephone Encounter (Signed)
Patient called back with 2nd number to call @ [phone].

## 2014-01-24 NOTE — Telephone Encounter (Signed)
Please respond Dr. Terrace ArabiaYan out of office:

## 2014-01-24 NOTE — Telephone Encounter (Signed)
I called patient. The patient indicates that on October 6, she had an event early in the morning trying to go to the bathroom, the patient weakness in the legs, fell to the floor, could not move, had dysarthria. The symptoms lasted about a day, and then resolve. The patient had just injected the Plegrity, and she was running a low-grade fever. The episode was unusual for an MS attack, given the sudden onset of deficits and slurred speech, may consider repeating the MRI the brain to look for evidence of a stroke event.

## 2014-01-24 NOTE — Telephone Encounter (Signed)
Patient stated she's been administering injections of Plegridy in 6 different locations on body.  The 3rd shot was injected at back of arm, after a couple day injection site became red and painful to the touch.  After the 4 th injections went to use the restroom at 2 am and immediately feel, couldn't move and spouse helped get back in bed.  He asked her to move over and she couldn't move, but could talk and see.  The 5 th injection which was last Tuesday on the right thigh, as of today still red and painful to the touch.  Questioning if she should proceed with next dose which will be tomorrow as scheduled.  Please call and advise.

## 2014-01-27 ENCOUNTER — Ambulatory Visit (INDEPENDENT_AMBULATORY_CARE_PROVIDER_SITE_OTHER): Payer: 59

## 2014-01-27 DIAGNOSIS — G450 Vertebro-basilar artery syndrome: Secondary | ICD-10-CM

## 2014-01-28 ENCOUNTER — Telehealth: Payer: Self-pay | Admitting: Neurology

## 2014-01-28 MED ORDER — PREDNISONE 10 MG PO TABS: ORAL_TABLET | ORAL | Status: DC

## 2014-01-28 NOTE — Telephone Encounter (Signed)
I called patient. The patient has had MRI evaluation that does not show evidence of stroke or any new MS changes from the prior study done in May 2015. The slurred speech has improved, but she is still having some problems with walking, she has fallen on one occasion. I will start her on a prednisone Dosepak, she will be seen in office next week.   MRI brain 01/28/2014  Impression   Abnormal MRI scan of the brain showing multiple discrete and  ovoid periventricular, subcortical, brainstem and cerebellar white matter  hyperintensities consistent with multiple sclerosis. No significant change  compared with previous MRI scan dated 08/19/2013

## 2014-02-01 ENCOUNTER — Ambulatory Visit (INDEPENDENT_AMBULATORY_CARE_PROVIDER_SITE_OTHER): Payer: 59 | Admitting: Neurology

## 2014-02-01 ENCOUNTER — Encounter: Payer: Self-pay | Admitting: Neurology

## 2014-02-01 VITALS — BP 132/80 | HR 92 | Ht 65.0 in | Wt 192.0 lb

## 2014-02-01 DIAGNOSIS — G35 Multiple sclerosis: Secondary | ICD-10-CM

## 2014-02-01 DIAGNOSIS — R269 Unspecified abnormalities of gait and mobility: Secondary | ICD-10-CM

## 2014-02-01 MED ORDER — BACLOFEN 10 MG PO TABS: 10.0000 mg | ORAL_TABLET | Freq: Three times a day (TID) | ORAL | Status: DC

## 2014-02-01 NOTE — Progress Notes (Signed)
[facility]  PATIENT: Dana Duke DOB: 10/11/1970  HISTORICAL (initial visit was in Nov 2014) Dana Duke is a [address] 43 years old right-handed African American female, referred by Dr. Suann Larryevesahwar, Janalyn RouseShaili for evaluation of gait difficulties,  Since 2011 she noticed gradual onset still worsening gait difficulty, she tends to drag her foot across the floor, stiff, has difficulty going upstairs, because of knee pain, she suffered a history of right ankle fracture in 2010, seems to dragging her right foot across the floor more,  She has bilateral knee pain, hip pain, but she denies significant low back pain, no neck pain, she has urinary urgency, no bowel incontinence   MRI at [facility] in May 7th 2015, without contrast  Abnormal MRI cervical spine (without) demonstrating at least 4 spinal cord T2 hyperintensities at cervico-medullary junction, C2, C3-4 and C6. In combination with MRI brain findings, these most likely represent demyelinating plaques  MRI brain (without) demonstrating multiple (> 20) round and ovoid, periventricular, callosal and peri-callosal, subcortical, juxtacortical, pontine and cerebellar T2 hyperintensities. Some of these are perpendicular to the lateral ventricles on axial and sagittal views. Some of these are hypointense on T1 views. Findings suspicious for demyelinating disease  The findings, and progressive worsening neurological complaints, are most consistent with multiple sclerosis, Most likely relapsing remitting multiple sclerosis   CSF showed 5 oligoclonal banding, elevated IgG index   Extensive laboratory evaluations showed normal or negative CBC, CMP, Lyme titer, ANA, NMO, TSH, B12, folic acid, RPR. Positive varicellar zoster antibody  The visual evoked response test above was within normal limits on the left, with prolongation of the P100 latency on the right.  JC virus antibody was 0.15 negative in 10/18/2013  UPDATE July 16th 2015:  MRI of  brain, and cervical spine showed no contrast enhancement, there was no lesions noticed MRI thoracic spine,  We have reviewed treatment options along with patient and her husband, pros and cons were discussed, we decided to proceed with Plegridy.  She also complains of few years history of constant bilateral temporal, frontal area headaches, pressure, there was no significant  light, noise sensitivity, 7 out of 10 on daily basis, she denies nauseous  UPDATE Oct 20th 2015:  She started Plegridy since July 2015,had  total of 6 injections, most recent one was Jan 24 2014, began full dose injection since Sept 15 2015.   She has fallen few times, more difficulty walking, when she fell few weeks ago, Sep 15th 2015, she has difficulty getting up, could not move from waist down for 2-3 hours, also noticed slurring of her speech. She has mild fever then  She called Dr. Anne HahnWillis, had a repeat MRI of the brain, we have reviewed MRI together, continued evidence of periventricular white matter disease consistent with her diagnosis of multiple sclerosis, there was no change compared to previous gain may 20/15  She was started on prednisone by mouth tapering since October 16, tolerating the medication well, seems to have mild improvement in her gait difficulty  She also complainsof injection site injection reactions, lasting 2-4 weeks, she only has mild flu like illness   REVIEW OF SYSTEMS: Full 14 system review of systems performed and notable only for blurry vision, gait difficulties  ALLERGIES: No Known Allergies  HOME MEDICATIONS: Outpatient Prescriptions Prior to Visit  Medication Sig Dispense Refill  . ACETAMINOPHEN-BUTALBITAL 50-325 MG TABS Take by mouth as directed.      Marland Kitchen. HYDROcodone-acetaminophen (NORCO) 10-325 MG per tablet Take 1 tablet by mouth  every 6 (six) hours as needed.      . nortriptyline (PAMELOR) 10 MG capsule One po qhs xone week, then 2 tabs po qhs  60 capsule  11  . predniSONE  (DELTASONE) 10 MG tablet Begin taking 6 tablets daily, taper by one tablet every other day until off the medication.  42 tablet  0  . rizatriptan (MAXALT-MLT) 5 MG disintegrating tablet Take 1 tablet (5 mg total) by mouth as needed for migraine. May repeat in 2 hours if needed  15 tablet  11   No facility-administered medications prior to visit.    PAST MEDICAL HISTORY: Past Medical History  Diagnosis Date  . Migraines   . Ankle fracture, right   . Knee joint dislocation     bilateral  . History of hysterectomy     PAST SURGICAL HISTORY: Past Surgical History  Procedure Laterality Date  . Abdominal hysterectomy    . Myomectomy    . Cesarean section      FAMILY HISTORY: Family History  Problem Relation Age of Onset  . Diabetes Mother   . Osteoarthritis    . Heart disease    . Rheumatic fever    . Hypertension      SOCIAL HISTORY:  History   Social History  . Marital Status: Married    Spouse Name: [name]    Number of Children: 1  . Years of Education: college   Occupational History  .      Does not work   Social History Main Topics  . Smoking status: Never Smoker   . Smokeless tobacco: Never Used  . Alcohol Use: 0.6 oz/week    1 Glasses of wine per week     Comment: On Holidays  . Drug Use: No  . Sexual Activity: Yes    Birth Control/ Protection: Surgical   Other Topics Concern  . Not on file   Social History Narrative   Patient is married. [name]).    Patient is unemployed.   Education- College education   Right handed.   Caffeine- None     PHYSICAL EXAM   There were no vitals filed for this visit. There is no weight on file to calculate BMI.   Generalized: In no acute distress  Neck: Supple, no carotid bruits   Cardiac: Regular rate rhythm  Pulmonary: Clear to auscultation bilaterally  Musculoskeletal: No deformity  Neurological examination  Mentation: Alert oriented to time, place, history taking, and causual  conversation  Cranial nerve II-XII: Pupils were equal round reactive to light extraocular movements were full, visual field were full on confrontational test. facial sensation and strength were normal. hearing was intact to finger rubbing bilaterally. Uvula tongue midline.  head turning and shoulder shrug and were normal and symmetric.Tongue protrusion into cheek strength was normal.  Motor: Moderate bilateral lower extremity spasticity, left hip flexion 4 knee flexion 4 plus, .  Sensory: Intact to fine touch, pinprick, preserved vibratory sensation, and proprioception at toes.  Coordination: Normal finger to nose, heel-to-shin bilaterally there was no truncal ataxia  Gait: Rising up from seated position without assistance, normal stance, mild wide based stiff gait, cautious, mild difficulty with tiptoe, heel and tandem walking Romberg signs: Negative  Deep tendon reflexes: Brachioradialis 2/2, biceps 2/2, triceps 2/2, patellar 3/3, Achilles 2/2, plantar responses were flexor bilaterally.   DIAGNOSTIC DATA (LABS, IMAGING, TESTING) - I reviewed patient records, labs, notes, testing and imaging myself where available.  ASSESSMENT AND PLAN   43 years old  right-handed [place] American female, with 3 years history of unsteady gait, hyperreflexia of bilateral lower extremity on examination,  marked abnormal MRI of the brain, and cervical spine, most consistent with relapsing remitting multiple sclerosis, also reported a history of constant headaches. Consistent with migraine, which has much improved with nortriptyline 10 mg every night, and Maxalt as needed  Her reported recent gradual worsening of her gait difficulty, without clear-cut onset, most likely represents deconditioning, worsening gait difficulty from her multiple sclerosis.  After extensive discussion with patient, in her family, we decided continual Plegridy, cold compression alternating with hot compression for injection site  reaction  Will repeat MRI of the cervical spine with and without contrast in December, followup visit afterwards,   Levert Feinstein, M.D. Ph.D.  [facility] [address] [place], [place] [address] [phone]

## 2014-02-04 ENCOUNTER — Telehealth: Payer: Self-pay | Admitting: Neurology

## 2014-02-04 NOTE — Telephone Encounter (Signed)
Dana Duke, please check on her physical therapy refer status, and let her know

## 2014-02-04 NOTE — Telephone Encounter (Signed)
patient inquiring about her physical therapy referral please advise patient

## 2014-02-07 NOTE — Telephone Encounter (Signed)
Called patient and left her a message stating her [facility] has been [facility] to [facility] for [facility] Therapy. I also called [facility] asking them to call patient she is ready to schedule.

## 2014-02-14 ENCOUNTER — Encounter: Payer: Self-pay | Admitting: Neurology

## 2014-02-28 ENCOUNTER — Ambulatory Visit: Payer: 59 | Attending: Neurology | Admitting: Physical Therapy

## 2014-02-28 ENCOUNTER — Encounter: Payer: Self-pay | Admitting: Physical Therapy

## 2014-02-28 DIAGNOSIS — Z5189 Encounter for other specified aftercare: Secondary | ICD-10-CM | POA: Diagnosis not present

## 2014-02-28 DIAGNOSIS — R296 Repeated falls: Secondary | ICD-10-CM

## 2014-02-28 DIAGNOSIS — M6281 Muscle weakness (generalized): Secondary | ICD-10-CM | POA: Insufficient documentation

## 2014-02-28 DIAGNOSIS — R269 Unspecified abnormalities of gait and mobility: Secondary | ICD-10-CM | POA: Insufficient documentation

## 2014-02-28 DIAGNOSIS — G35 Multiple sclerosis: Secondary | ICD-10-CM

## 2014-02-28 DIAGNOSIS — R531 Weakness: Secondary | ICD-10-CM

## 2014-02-28 NOTE — Therapy (Signed)
Physical Therapy Evaluation  Patient Details  Name: Dana Duke MRN: [id number] Date of Birth: [birth year]  Encounter Date: 02/28/2014      PT End of Session - 02/28/14 1054    Visit Number 1   Number of Visits 16   Date for PT Re-Evaluation 04/29/14   PT Start Time 1015   PT Stop Time 1054   PT Time Calculation (min) 39 min   Equipment Utilized During Treatment Gait belt   Activity Tolerance Patient tolerated treatment well;Patient limited by fatigue   Behavior During Therapy WFL for tasks assessed/performed      Past Medical History  Diagnosis Date  . Migraines   . Ankle fracture, right   . Knee joint dislocation     bilateral  . History of hysterectomy     Past Surgical History  Procedure Laterality Date  . Abdominal hysterectomy    . Myomectomy    . Cesarean section      There were no vitals taken for this visit.  Visit Diagnosis:  Multiple sclerosis - Plan: PT PLAN OF CARE CERT/RE-CERT  Abnormality of gait - Plan: PT PLAN OF CARE CERT/RE-CERT  Weakness generalized - Plan: PT PLAN OF CARE CERT/RE-CERT  Falls frequently - Plan: PT PLAN OF CARE CERT/RE-CERT      Subjective Assessment - 02/28/14 1017    Symptoms Pt is a 43 y/o female who presents to OPPT for difficulty walking and balance deficits.  Pt. reports tingling in hands and muscle spasms in feet.  Pt. diagnosed with relapsing remitting MS in May 2015.   Limitations Standing;Walking;House hold activities   How long can you stand comfortably? 5 min   How long can you walk comfortably? 5 min   Patient Stated Goals improve mobility; walk, standing and peform household chores   Currently in Pain? Yes   Pain Score 10-Worst pain ever  "I just bear it all day."   Pain Location Knee  and hip; L>R   Pain Orientation Right;Left   Pain Descriptors / Indicators Burning;Throbbing   Pain Type Chronic pain   Pain Onset More than a month ago   Pain Frequency Constant   Aggravating Factors  unknown   Pain  Relieving Factors "I don't know."   Multiple Pain Sites No          [facility] PT Assessment - 02/28/14 1024    Assessment   Medical Diagnosis MS   Onset Date 08/23/13   Next MD Visit 04/03/14   Prior Therapy n/a   Precautions   Precautions Fall   Restrictions   Weight Bearing Restrictions No   Balance Screen   Has the patient fallen in the past 6 months Yes   How many times? 10   Has the patient had a decrease in activity level because of a fear of falling?  Yes   Is the patient reluctant to leave their home because of a fear of falling?  Yes   Home Environment   Living Enviornment Private residence   Living Arrangements Spouse/significant other;Children  daughter in college   Available Help at Discharge Family;Available PRN/intermittently   Type of Home House   Home Access Stairs to enter   Entrance Stairs-Number of Steps 2   Entrance Stairs-Rails None  holds onto wall   Home Layout Two level;Able to live on main level with bedroom/bathroom;Full bath on main level   Home Equipment Cane - single point   Prior Function   Level of Independence Needs assistance with ADLs;Independent  with gait;Independent with transfers   Level of Independence - Bath Supervision/set-up   Vocation Unemployed   Observation/Other Assessments   Focus on Therapeutic Outcomes (FOTO)  40  predicts 19 point improvement   Strength   Right Hip Flexion 3/5   Left Hip Flexion 3-/5   Right Knee Flexion 3/5   Right Knee Extension 3+/5   Left Knee Flexion 3/5   Left Knee Extension 3/5   Right Ankle Dorsiflexion 3/5   Left Ankle Dorsiflexion 3/5   Ambulation/Gait   Ambulation/Gait Yes   Ambulation/Gait Assistance 4: Min assist   Ambulation Distance (Feet) 150 Feet   Assistive device None   Gait Pattern Decreased dorsiflexion - right;Decreased dorsiflexion - left;Lateral hip instability;Poor foot clearance - left;Poor foot clearance - right;Narrow base of support;Scissoring;Left genu recurvatum   Gait  velocity 3.08 ft/sec   Standardized Balance Assessment   Standardized Balance Assessment Berg Balance Test;Timed Up and Go Test   Berg Balance Test   Sit to Stand Able to stand  independently using hands   Standing Unsupported Able to stand 30 seconds unsupported   Sitting with Back Unsupported but Feet Supported on Floor or Stool Able to sit safely and securely 2 minutes   Stand to Sit Controls descent by using hands   Transfers Able to transfer with verbal cueing and /or supervision   Standing Unsupported with Eyes Closed Able to stand 10 seconds with supervision   Standing Ubsupported with Feet Together Able to place feet together independently but unable to hold for 30 seconds   From Standing, Reach Forward with Outstretched Arm Reaches forward but needs supervision   From Standing Position, Pick up Object from Floor Unable to pick up and needs supervision   From Standing Position, Turn to Look Behind Over each Shoulder Needs supervision when turning   Turn 360 Degrees Needs close supervision or verbal cueing   Standing Unsupported, Alternately Place Feet on Step/Stool Needs assistance to keep from falling or unable to try   Standing Unsupported, One Foot in Front Able to take small step independently and hold 30 seconds   Standing on One Leg Tries to lift leg/unable to hold 3 seconds but remains standing independently  total 26/56   Timed Up and Go Test   Normal TUG (seconds) 13.93            PT Education - 02/28/14 1055    Education provided No          PT Short Term Goals - 02/28/14 1057    PT SHORT TERM GOAL #1   Title independent with HEP (03/28/14)   Time 4   Period Weeks   Status New   PT SHORT TERM GOAL #2   Title improve BERG balance score to > 30/56 for improved balance (03/28/14)   Time 4   Period Weeks   Status New   PT SHORT TERM GOAL #3   Title ambulate > 250' with LRAD modified independent for improved functional mobility (03/28/14)   Time 4    Period Weeks   Status New   PT SHORT TERM GOAL #4   Title assess for need for lower extremity orthotic (R, L, bil) and determine appropriate device if indicated (03/28/14)   Time 4   Period Weeks   Status New          PT Long Term Goals - 02/28/14 1058    PT LONG TERM GOAL #1   Title verbalize understanding of fall prevention strategies and  energy conservation (04/25/14)   Time 8   Period Weeks   Status New   PT LONG TERM GOAL #2   Title improve BERG balance score to > 36/56 for decreased fall risk and improved function (04/25/14)   Time 8   Period Weeks   Status New   PT LONG TERM GOAL #3   Title improve timed up and go to < 13 sec for improved mobility (04/25/14)   Time 8   Period Weeks   Status New   PT LONG TERM GOAL #4   Title ambulate > 500' on paved outdoor/various indoor surfaces with LRAD modified independent for improved functional mobility. (04/25/14)   Time 8   Period Weeks   Status New          Plan - 02/28/14 1055    Clinical Impression Statement Pt. presents to OPPT with balance and gait deficits affecting mobility and ADLs.  Will benefit from PT to maximize functional mobility and decrease fall risk.   Pt will benefit from skilled therapeutic intervention in order to improve on the following deficits Abnormal gait;Decreased activity tolerance;Decreased balance;Decreased knowledge of use of DME;Decreased strength;Impaired perceived functional ability;Pain;Decreased knowledge of precautions;Decreased endurance;Difficulty walking   Rehab Potential Good   PT Frequency 2x / week   PT Duration 8 weeks   PT Treatment/Interventions ADLs/Self Care Home Management;Gait training;Neuromuscular re-education;Functional mobility training;Patient/family education;Therapeutic activities;Electrical Stimulation;Therapeutic exercise;Manual techniques;Energy conservation;Balance training;Moist Heat;Cryotherapy   PT Next Visit Plan assess for appropriate AD, trial AFOs; balance and  strengthening HEP   Recommended Other Services OT-referral requested   Consulted and Agree with Plan of Care Patient        Problem List Patient Active Problem List   Diagnosis Date Noted  . Headache 10/28/2013  . Multiple sclerosis 08/23/2013  . Gait difficulty 02/22/2013                                     Clarita Crane, PT, DPT 02/28/2014 12:14 PM

## 2014-03-01 ENCOUNTER — Telehealth: Payer: Self-pay | Admitting: Physical Therapy

## 2014-03-01 DIAGNOSIS — R269 Unspecified abnormalities of gait and mobility: Secondary | ICD-10-CM

## 2014-03-01 DIAGNOSIS — G35 Multiple sclerosis: Secondary | ICD-10-CM

## 2014-03-07 ENCOUNTER — Ambulatory Visit: Payer: 59 | Admitting: Physical Therapy

## 2014-03-07 NOTE — Telephone Encounter (Signed)
-----   Message from Clarita Crane, PT sent at 03/01/2014  3:28 PM EST ----- Regarding: OT referral request Feel pt would benefit from an OT referral to address UE deficits and difficulty with ADLs.  If you agree will you please send referral in EPIC?  Thanks- Clarita Crane, PT, DPT 03/01/2014 3:30 PM [facility] [phone]

## 2014-03-07 NOTE — Telephone Encounter (Signed)
OT order placed per recommendation

## 2014-03-08 ENCOUNTER — Encounter: Payer: Self-pay | Admitting: Physical Therapy

## 2014-03-08 ENCOUNTER — Ambulatory Visit: Payer: 59 | Admitting: Physical Therapy

## 2014-03-08 DIAGNOSIS — R269 Unspecified abnormalities of gait and mobility: Secondary | ICD-10-CM

## 2014-03-08 DIAGNOSIS — R531 Weakness: Secondary | ICD-10-CM

## 2014-03-08 DIAGNOSIS — Z5189 Encounter for other specified aftercare: Secondary | ICD-10-CM | POA: Diagnosis not present

## 2014-03-08 DIAGNOSIS — R296 Repeated falls: Secondary | ICD-10-CM

## 2014-03-08 NOTE — Patient Instructions (Signed)
Toe / Heel Raise   Hold onto stable object: Gently rock back on heels and raise toes. Then rock forward on toes and raise heels. Repeat sequence _10___ times per session. Do __1-2__ sessions per week.  Copyright  VHI. All rights reserved.  Knee Flexion: Resisted (Sitting)   Sit with band around ankles and prop/support one foot on a stool or stack of books. Pull unsupported leg back and then slowly return. Repeat __10__ times per leg.  Do _1-2___ sessions per day.  http://orth.exer.us/695   Copyright  VHI. All rights reserved.  Strengthening: Hip Abductor - Resisted   Seated at edge of chair/bed/sofa with band looped around both legs above knees, push thighs apart. Hold for 5 seconds. Repeat __10__ times per set. Do _1-2___ sessions per day.  http://orth.exer.us/689   Copyright  VHI. All rights reserved.  FLEXION: Sitting (Active)   Sit, both feet flat with band around legs above knees. Lift right knee toward ceiling. Lower. Repeat with other leg. Complete _1__ sets of _10__ repetitions each leg. Perform __1-2_ sessions per day.   Functional Quadriceps: Sit to Stand   Sit on edge of chair, feet flat on floor. Stand upright, extending knees fully. Repeat __10__ times per set. Do __1-2__ sessions per day.  http://orth.exer.us/735   Copyright  VHI. All rights reserved.  Knee Extension: Resisted (Sitting)   Seated with band looped around ankles. Keep other leg bent to increase resistance and straighten other leg out. Hold for 5 seconds. Repeat _10___ times each leg. Do ___1-2_ sessions per day.  http://orth.exer.us/691   Copyright  VHI. All rights reserved.

## 2014-03-09 ENCOUNTER — Ambulatory Visit: Payer: 59 | Admitting: Physical Therapy

## 2014-03-09 ENCOUNTER — Encounter: Payer: Self-pay | Admitting: Physical Therapy

## 2014-03-09 DIAGNOSIS — R531 Weakness: Secondary | ICD-10-CM

## 2014-03-09 DIAGNOSIS — R269 Unspecified abnormalities of gait and mobility: Secondary | ICD-10-CM

## 2014-03-09 DIAGNOSIS — R296 Repeated falls: Secondary | ICD-10-CM

## 2014-03-09 DIAGNOSIS — Z5189 Encounter for other specified aftercare: Secondary | ICD-10-CM | POA: Diagnosis not present

## 2014-03-09 NOTE — Therapy (Addendum)
Physical Therapy Treatment  Patient Details  Name: Dana Duke MRN: [id number] Date of Birth: 15-Sep-1970  Encounter Date: 03/08/2014      PT End of Session - 03/08/14 1145    Visit Number 2   Number of Visits 16   Date for PT Re-Evaluation 04/29/14   PT Start Time 1102   PT Stop Time 1145   PT Time Calculation (min) 43 min   Equipment Utilized During Treatment Gait belt   Activity Tolerance Patient limited by fatigue;Patient tolerated treatment well   Behavior During Therapy [facility] for tasks assessed/performed      Past Medical History  Diagnosis Date  . Migraines   . Ankle fracture, right   . Knee joint dislocation     bilateral  . History of hysterectomy     Past Surgical History  Procedure Laterality Date  . Abdominal hysterectomy    . Myomectomy    . Cesarean section      There were no vitals taken for this visit.  Visit Diagnosis:  Gait difficulty  Abnormality of gait  Weakness generalized  Falls frequently      Subjective Assessment - 03/08/14 1104    Symptoms No new complaints. Reports no falls. No changes since eval.   Currently in Pain? Yes   Pain Score 7    Pain Location Knee   Pain Orientation Left   Pain Descriptors / Indicators Sore;Burning   Pain Type Chronic pain   Pain Onset More than a month ago   Pain Frequency Constant   Aggravating Factors  unknown   Pain Relieving Factors rest sometimes            [facility] Adult PT Treatment/Exercise - 03/08/14 1106    Transfers   Transfers Sit to Stand;Stand to Sit   Sit to Stand 4: Min guard   Stand to Sit 4: Min guard   Ambulation/Gait   Ambulation/Gait Yes   Ambulation/Gait Assistance 4: Min guard;4: Min assist   Ambulation/Gait Assistance Details cues on posture and step length/step placement. up to min assist for balance due to bil foot drop and LE weakness.                Ambulation Distance (Feet) 115 Feet  x3 laps total with rest x2 to change out AD's   Assistive device Small  based quad cane;Large base quad cane  tripod cane   Gait Pattern Step-through pattern;Decreased stride length;Left genu recurvatum;Decreased dorsiflexion - right;Decreased dorsiflexion - left;Narrow base of support;Poor foot clearance - left;Poor foot clearance - right;Lateral hip instability;Scissoring;Decreased trunk rotation   Gait velocity decreased     There-ex: Seated heel/toe raises x10 reps; with yellow theraband: hamstring curls, long arc quads, clamshell and alternating marches x10 each to bil legs. Issued as written HEP today with yellow theraband.       PT Short Term Goals - 03/08/14 1145    PT SHORT TERM GOAL #1   Title independent with HEP (03/28/14)   Status On-going   PT SHORT TERM GOAL #2   Title improve BERG balance score to > 30/56 for improved balance (03/28/14)   Status On-going   PT SHORT TERM GOAL #3   Title ambulate > 250' with LRAD modified independent for improved functional mobility (03/28/14)   Status On-going   PT SHORT TERM GOAL #4   Title assess for need for lower extremity orthotic (R, L, bil) and determine appropriate device if indicated (03/28/14)   Status On-going  PT Long Term Goals - 03/08/14 0959    PT LONG TERM GOAL #1   Title verbalize understanding of fall prevention strategies and energy conservation (04/25/14)   Status On-going   PT LONG TERM GOAL #2   Title improve BERG balance score to > 36/56 for decreased fall risk and improved function (04/25/14)   Status On-going   PT LONG TERM GOAL #3   Title improve timed up and go to < 13 sec for improved mobility (04/25/14)   Status On-going   PT LONG TERM GOAL #4   Title ambulate > 500' on paved outdoor/various indoor surfaces with LRAD modified independent for improved functional mobility. (04/25/14)   Status On-going          Plan - 03/08/14 1145    Clinical Impression Statement Focued on issuing HEP for LE strengthening today. Worked on gait with various assistive devices as  well. Unable to trial braces for bil foot drop/knee recurvatum due to style of shoes pt wore today. Pt reported feeling okay after exercises and gait today.                Pt will benefit from skilled therapeutic intervention in order to improve on the following deficits Abnormal gait;Decreased activity tolerance;Decreased balance;Decreased knowledge of use of DME;Decreased strength;Impaired perceived functional ability;Pain;Decreased knowledge of precautions;Decreased endurance;Difficulty walking   Rehab Potential Good   PT Frequency 2x / week   PT Duration 8 weeks   PT Treatment/Interventions ADLs/Self Care Home Management;Gait training;Neuromuscular re-education;Functional mobility training;Patient/family education;Therapeutic activities;Electrical Stimulation;Therapeutic exercise;Manual techniques;Energy conservation;Balance training;Moist Heat;Cryotherapy   PT Next Visit Plan Continue to work on gait with most appropriate AD and brace for foot drop/knee recurvatum; continue with strengthening and balance, issue additional HEP as appropriate.   Consulted and Agree with Plan of Care Patient;Family member/caregiver   Family Member Consulted pt's spouse        Problem List Patient Active Problem List   Diagnosis Date Noted  . Headache 10/28/2013  . Multiple sclerosis 08/23/2013  . Gait difficulty 02/22/2013      Sallyanne KusterBury, Keviana Guida 03/09/2014, 10:01 AM   Sallyanne KusterKathy Demarius Archila, PTA, [facility] [address], [address] [place], [place] [address] [phone] 03/09/2014, 10:03 AM

## 2014-03-09 NOTE — Therapy (Signed)
Physical Therapy Treatment  Patient Details  Name: Dana Duke MRN: [id number] Date of Birth: 27-Sep-1970  Encounter Date: 03/09/2014      PT End of Session - 03/09/14 1302    Visit Number 3   Number of Visits 16   Date for PT Re-Evaluation 04/29/14   PT Start Time 0805   PT Stop Time 0845   PT Time Calculation (min) 40 min   Equipment Utilized During Treatment Gait belt   Activity Tolerance Patient limited by fatigue;Patient limited by pain   Behavior During Therapy [facility] for tasks assessed/performed      Past Medical History  Diagnosis Date  . Migraines   . Ankle fracture, right   . Knee joint dislocation     bilateral  . History of hysterectomy     Past Surgical History  Procedure Laterality Date  . Abdominal hysterectomy    . Myomectomy    . Cesarean section      There were no vitals taken for this visit.  Visit Diagnosis:  Gait difficulty  Abnormality of gait  Weakness generalized  Falls frequently      Subjective Assessment - 03/09/14 1254    Symptoms Really sore today, did do HEP again last night after performing it in therapy yesterday.    Currently in Pain? Yes   Pain Score 9    Pain Location Knee   Pain Orientation Right;Left  left > right   Pain Descriptors / Indicators Sore;Aching;Tender   Pain Type Chronic pain   Pain Onset More than a month ago   Pain Frequency Constant   Aggravating Factors  unknown   Pain Relieving Factors rest at times   Effect of Pain on Daily Activities limits walking and standing ability     Orthotic/AD trials:  1 lap with ott[place]ock to bil legs with small based quad cane (sbqc). Needed up to min-mod assist for balance and still with bil foot drop and knee hyperextension. Attempted bil toe off braces and pt reported they were "digging in" at her ankle into the bones after about 5 steps, so removed them. 1 lap with blue rocker to bil legs with sbqc, min to mod assist at times for balance. Continues foot drag and  left knee hyperextension. 1 lap with blue rocker's and RW, min assist with improved balance. Continued knee hyperextension with improved bil foot clearance. 1 lap with blue rocker's, RW and added left heel wedge. Pt demo'd decreased force and degrees of hyperextension of left knee, however recurvatum did continue despite addition of wedge. 1 lap with blue rockers, left heel wedge and rollator. Min guard assist for balance, good foot clearance and continued left knee recurvatum.      [facility] Adult PT Treatment/Exercise - 03/09/14 1258    Ambulation/Gait   Ambulation/Gait Yes   Ambulation/Gait Assistance 4: Min guard;4: Min assist   Ambulation/Gait Assistance Details trialed multiple orthotics and AD's today with gait. Pt needed cues with each device on posture, increased BOS, step placement and to decrease step lenght at times for improved step control.                Ambulation Distance (Feet) 115 Feet  x5 laps   Assistive device Small based quad cane;Rolling walker;Rollator  RW with 2 laps and rollator with last lap   Gait Pattern Step-through pattern;Decreased stride length;Ataxic;Scissoring;Poor foot clearance - left;Poor foot clearance - right;Lateral hip instability;Decreased trunk rotation;Narrow base of support;Left genu recurvatum   Gait velocity decreased  PT Short Term Goals - 03/09/14 1308    PT SHORT TERM GOAL #1   Title independent with HEP (03/28/14)   Status On-going   PT SHORT TERM GOAL #2   Title improve BERG balance score to > 30/56 for improved balance (03/28/14)   Status On-going   PT SHORT TERM GOAL #3   Title ambulate > 250' with LRAD modified independent for improved functional mobility (03/28/14)   Status On-going   PT SHORT TERM GOAL #4   Title assess for need for lower extremity orthotic (R, L, bil) and determine appropriate device if indicated (03/28/14)   Status On-going          PT Long Term Goals - 03/09/14 1308    PT LONG TERM GOAL #1    Title verbalize understanding of fall prevention strategies and energy conservation (04/25/14)   Status On-going   PT LONG TERM GOAL #2   Title improve BERG balance score to > 36/56 for decreased fall risk and improved function (04/25/14)   Status On-going   PT LONG TERM GOAL #3   Title improve timed up and go to < 13 sec for improved mobility (04/25/14)   Status On-going   PT LONG TERM GOAL #4   Title ambulate > 500' on paved outdoor/various indoor surfaces with LRAD modified independent for improved functional mobility. (04/25/14)   Status On-going          Plan - 03/09/14 1303    Clinical Impression Statement Looked at multiple bracing options for bil legs today to improve gait quality and safety, as well as a couple of AD option's. Pt felt most comfortable with blue rocker brace on bil legs and using rollator for gait. Still with left knee hyperextension with blue rocker with heel wedge so may need knee component for control. Pt shown a picture of this as we do not have examples of one in clinic. Reported no increase in pain at end of session.                 Pt will benefit from skilled therapeutic intervention in order to improve on the following deficits Abnormal gait;Decreased activity tolerance;Decreased balance;Decreased knowledge of use of DME;Decreased strength;Impaired perceived functional ability;Pain;Decreased knowledge of precautions;Decreased endurance;Difficulty walking   Rehab Potential Good   PT Frequency 2x / week   PT Duration 8 weeks   PT Treatment/Interventions ADLs/Self Care Home Management;Gait training;Neuromuscular re-education;Functional mobility training;Patient/family education;Therapeutic activities;Electrical Stimulation;Therapeutic exercise;Manual techniques;Energy conservation;Balance training;Moist Heat;Cryotherapy   PT Next Visit Plan Continue to work on gait with most appropriate AD and brace for foot drop/knee recurvatum; continue with strengthening and  balance, issue additional HEP as appropriate. Request MD order for braces and walker.   PT Home Exercise Plan decrease to only 1x a day as directed on HEP and not on days she has therapy or lots of activity to prevent overdoing it/increasing pain.   Consulted and Agree with Plan of Care Patient;Family member/caregiver   Family Member Consulted spouse        Problem List Patient Active Problem List   Diagnosis Date Noted  . Headache 10/28/2013  . Multiple sclerosis 08/23/2013  . Gait difficulty 02/22/2013        Sallyanne KusterBury, Keaden Gunnoe 03/09/2014, 1:10 PM  Sallyanne KusterKathy Trivia Heffelfinger, PTA, [facility] [address], [address] [place], [place] [address] [phone] 03/09/2014, 1:22 PM

## 2014-03-14 ENCOUNTER — Encounter: Payer: Self-pay | Admitting: Occupational Therapy

## 2014-03-14 ENCOUNTER — Ambulatory Visit: Payer: 59 | Admitting: Occupational Therapy

## 2014-03-14 ENCOUNTER — Ambulatory Visit: Payer: 59

## 2014-03-14 DIAGNOSIS — G35 Multiple sclerosis: Secondary | ICD-10-CM

## 2014-03-14 DIAGNOSIS — R296 Repeated falls: Secondary | ICD-10-CM

## 2014-03-14 DIAGNOSIS — R279 Unspecified lack of coordination: Secondary | ICD-10-CM

## 2014-03-14 DIAGNOSIS — R531 Weakness: Secondary | ICD-10-CM

## 2014-03-14 DIAGNOSIS — R278 Other lack of coordination: Secondary | ICD-10-CM

## 2014-03-14 DIAGNOSIS — Z5189 Encounter for other specified aftercare: Secondary | ICD-10-CM | POA: Diagnosis not present

## 2014-03-14 DIAGNOSIS — R269 Unspecified abnormalities of gait and mobility: Secondary | ICD-10-CM

## 2014-03-14 NOTE — Therapy (Signed)
Occupational Therapy Evaluation  Patient Details  Name: Dana Duke MRN: [id number] Date of Birth: 1970-11-02  Encounter Date: 03/14/2014      OT End of Session - 03/14/14 1038    Visit Number 1   Number of Visits 16   Date for OT Re-Evaluation 05/02/14   OT Start Time 0845   OT Stop Time 0930   OT Time Calculation (min) 45 min   Behavior During Therapy [facility] for tasks assessed/performed      Past Medical History  Diagnosis Date  . Migraines   . Ankle fracture, right   . Knee joint dislocation     bilateral  . History of hysterectomy     Past Surgical History  Procedure Laterality Date  . Abdominal hysterectomy    . Myomectomy    . Cesarean section      There were no vitals taken for this visit.  Visit Diagnosis:  Multiple sclerosis - Plan: Ot plan of care cert/re-cert  Decreased coordination - Plan: Ot plan of care cert/re-cert      Subjective Assessment - 03/14/14 0851    Symptoms Pt is a 43 y/o RHD female, She has dx relapsing MS, dx in May 2015. Pt reports decreased balance, impaired gait, constant headaches, pain (hip, knees greater on left side), decreased coordination and tingling bilateral hands, drops items and this affects ADL's.   Currently in Pain? Yes   Pain Score 9    Pain Location Other (Comment)  Hip and knee pain   Pain Orientation Right;Left   Pain Descriptors / Indicators Aching   Pain Type Chronic pain  Pt takes alieve   Pain Onset More than a month ago   Pain Frequency Constant   Aggravating Factors  Nothing   Pain Relieving Factors Nothing   Effect of Pain on Daily Activities Limits walking, standing and overall functional mobility   Multiple Pain Sites Yes  Hip and knees          [facility] OT Assessment - 03/14/14 0845    Assessment   Diagnosis --  MS   Onset Date --  May 2015   Prior Therapy --  Pt reports that she began PT 02/28/14, today OT eval   Precautions   Precautions Fall   Required Braces or Orthoses --  No,    Balance Screen   Has the patient fallen in the past 6 months Yes   How many times? --  Pt reports that she has falen in last 3 weeks, 1 time   Has the patient had a decrease in activity level because of a fear of falling?  Yes   Is the patient reluctant to leave their home because of a fear of falling?  Yes   Home  Environment   Family/patient expects to be discharged to: Private residence   Living Arrangements Spouse/significant other   Available Help at Discharge Family   Type of Home House   Home Access Stairs   Home Layout Two level  Stays on first floor, does not go upstairs   Bathroom Forensic psychologist Accessibility --  Has SPC but doesn't use often b/c it doesn't help   Pension scheme manager   Lives With Spouse;Daughter  46 y/o   Prior Function   Level of Independence Needs assistance with ADLs  Set-up assistance for bathing and ADL's   Level of Independence - Bath Supervision/set-up  Vocation --  Is not curently working, pt reports that she stopped before    Comments --  Performed Administrative 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 May 2015    ADL   Eating/Feeding Minimal assistance   Grooming Supervision/safety  supervision-min A depending on symptoms   Upper Body Bathing Minimal assistance   Lower Body Bathing Minimal assistance   Upper Body Dressing Minimal assistance  Difficulty buttoning buttons, donning adult diapers, zippers   Lower Body Dressing Minimal assistance  Increased difficulty LB ADL's vs UB ADL's   Toilet Tranfer Minimal assistance  Pt reports HHA, or reaching for objects due to balance   Tub/Shower Transfer Minimal assistance  decreased balance, using shower chair and hand held assist i   IADL   Light Housekeeping All laundry must be done by others  Able to perform light tasks depending on symptoms   Prior Level of Function Community Mobility --  Mod I    Prior Level of Function Meal Prep --  Husband assists PRN for meal prep 2* decreaesd coordination   Prior Level of Function Financial Management --  Husband shares financial management    Financial Management Has difficulty remembering to take medication  Sets alarm to assist with remembering meds   Mobility   Mobility Status History of falls  Decreased balance, refer to PT assessment   Mobility Status Comments --  Has SPC, does not always use as "doesn't help"   Written Expression   Dominant Hand Right   Handwriting --  Doesn't do a lot handwriting, can sign name   Vision - History   Baseline Vision Wears glasses all the time   Visual History Other (comment)  Migraines affect vision (blurry, difficulty seeing)   Patient Visual Report Eye fatigue/eye pain/headache   Vision Assessment   Comment Pt reports h/o eye pain 2* migraines   Activity Tolerance   Activity Tolerance Tolerates < 10 min activity with changes in vital signs   Activity Tolerance Comments --  Impaired activity tolerance   Cognition   Overall Cognitive Status --  Pt reports difficulty STM, sets alarms to remember meds etc.   Sensation   Light Touch Impaired by gross assessment  See notes below in additional comments   Hot/Cold Appears Intact   Additional Comments --  Pt intact to LT, but reports this fluctuates.   Coordination   Gross Motor Movements are Fluid and Coordinated No  Impaired   Fine Motor Movements are Fluid and Coordinated No   Coordination and Movement Description --  Impaired   9 Hole Peg Test (p) --  R=1 min 2 sec L= 56.97 sec   Box and Blocks --  R= 24 L= 20   Coordination --  Impaired bilateral UE's, fatigues easily   Hand Function   Left Hand Gross Grasp Impaired  Impaired grip/pinch as pt fatigues easily; WFL initially.   Comments --  Gross grasp 3/5 bilat UE's initially, fatigues easily     Verbally reviewed OT POC and goals, pt wishes to focus on ADL retraining and  coordination to assist with increasing independence for self care and home making tasks. Pt. presents to OPOT with balance and gait deficits affecting mobility and ADLs, slef care tasks. She will benefit from OT to assist in maximizing ADL independence, a/e instruction, pt/family education, functional mobility and decrease fall risk.Pt verbally agreed with this.      OT Education - 03/14/14 1037    Education provided Yes   Education Details Role  of OT, POC   Person(s) Educated Patient;Parent(s)   Methods Explanation   Comprehension Verbalized understanding          OT Short Term Goals - 03/14/14 1040    OT SHORT TERM GOAL #1   Title Pt will be I home program for coordination bilateral UE's   Time 4   Period Weeks   Status New   OT SHORT TERM GOAL #2   Title Pt will I'ly state 2-3 energy conservation techniques that she can implement during ADL's   Time 4   Period Weeks   Status New   OT SHORT TERM GOAL #3   Title Pt will be Min A LB dressing using a/e PRN    Time 4   Period Weeks   Status New          OT Long Term Goals - 03/14/14 1042    OT LONG TERM GOAL #1   Title Pt will be Mod I upated HEP   Time 8   Period Weeks   Status New   OT LONG TERM GOAL #2   Title Pt will be Mod I LB dressing using a/e PRN seated and with sit to stand.   Time 8   Period Weeks   Status New   OT LONG TERM GOAL #3   Title Pt will demonstrate Mod I basic ADL's and homemaking tasks using a/e PRN   Time 8   Period Weeks   Status New   OT LONG TERM GOAL #4   Title Pt will be Mod I simple snack/meal prep while implementing energy conservation techniques   Time 8   Period Weeks   Status New          Plan - 03/14/14 1039    Clinical Impression Statement Verbally reviewed OT POC and goals, pt wishes to focus on ADL retraining and coordination to assist with increasing independence for self care and home making tasks. Pt. presents to OPOT with balance and gait deficits affecting  mobility and ADLs, slef care tasks. She will benefit from OT to assist in maximizing ADL independence, a/e instruction, pt/family education, functional mobility and decrease fall risk.Pt verbally agreed with this.   Rehab Potential Good   OT Frequency 2x / week   OT Duration 8 weeks   OT Treatment/Interventions Self-care/ADL training;Therapeutic exercise;Neuromuscular education;Energy conservation;DME and/or AE instruction;Therapeutic activities;Patient/family education;Balance training   Consulted and Agree with Plan of Care Patient;Family member/caregiver   Family Member Consulted Patient and her mother        Problem List Patient Active Problem List   Diagnosis Date Noted  . Headache 10/28/2013  . Multiple sclerosis 08/23/2013  . Gait difficulty 02/22/2013                                                Alm Bustard, OT  03/14/2014, 10:54 AM

## 2014-03-14 NOTE — Therapy (Signed)
Physical Therapy Treatment  Patient Details  Name: Dana Duke MRN: [id number] Date of Birth: 30-Jan-1971  Encounter Date: 03/14/2014      PT End of Session - 03/14/14 1025    Visit Number 4   Number of Visits 16   Date for PT Re-Evaluation 04/29/14   PT Start Time 0813   PT Stop Time 0845   PT Time Calculation (min) 32 min   Equipment Utilized During Treatment Gait belt   Activity Tolerance Patient limited by fatigue;Patient limited by pain   Behavior During Therapy [facility] for tasks assessed/performed      Past Medical History  Diagnosis Date  . Migraines   . Ankle fracture, right   . Knee joint dislocation     bilateral  . History of hysterectomy     Past Surgical History  Procedure Laterality Date  . Abdominal hysterectomy    . Myomectomy    . Cesarean section      There were no vitals taken for this visit.  Visit Diagnosis:  Gait difficulty  Abnormality of gait  Weakness generalized  Falls frequently  Multiple sclerosis      Subjective Assessment - 03/14/14 0815    Symptoms Pt arrived 12 minutes late. Pt reported she feels about the same and denied any changes or falls.   Currently in Pain? Yes   Pain Score 7   7/10 at rest and 9/10 during ambulation   Pain Location Knee  knee and hip   Pain Orientation Right;Left   Pain Descriptors / Indicators Aching   Pain Type Chronic pain   Pain Onset More than a month ago   Pain Frequency Constant   Aggravating Factors  nothing   Pain Relieving Factors nothing   Effect of Pain on Daily Activities limits walking and standing ability            [facility] Adult PT Treatment/Exercise - 03/14/14 0813    Transfers   Transfers Sit to Stand;Stand to Sit   Sit to Stand 5: Supervision   Sit to Stand Details (indicate cue type and reason) VC's for sequencing with rollator.   Stand to Sit 5: Supervision   Stand to Sit Details VC's for sequencing and to improve eccentric control.   Ambulation/Gait    Ambulation/Gait Yes   Ambulation/Gait Assistance 4: Min guard;5: Supervision   Ambulation/Gait Assistance Details Pt ambulated with B blue rocker AFO and then [place] on L LE and blue rock AFO donned on R LE. Pt noted to experience excessive B genu recurvatum, with decr. L genu recurvatum with [place] donned but decr. dorsiflex with [place]. B blue rocker improve foot clearance. VC's for upright posture and to decr. narrow BOS.   Ambulation Distance (Feet) --  64' x2, 115'x2   Assistive device Rollator   Exercises   Exercises Knee/Hip   Knee/Hip Exercises: Standing   Knee Flexion Strengthening;1 set;Other (comment)  standing 1 set of 8 reps B LE with UE on counter.   Knee Flexion Limitations Performed with supervision. VCs, demonstration, and tactile cues for technique and to decr. hip flex.   Wall Squat 10 reps;Other (comment)  mini wall squats   Wall Squat Limitations VCs and demonstration for technique. Pt experienced incr. R knee pain during squat, decreased knee flex and pain decr.          PT Education - 03/14/14 1025    Education provided No          PT Short Term Goals - 03/14/14  1028    PT SHORT TERM GOAL #1   Title independent with HEP (03/28/14)   Status On-going   PT SHORT TERM GOAL #2   Title improve BERG balance score to > 30/56 for improved balance (03/28/14)   Status On-going   PT SHORT TERM GOAL #3   Title ambulate > 250' with LRAD modified independent for improved functional mobility (03/28/14)   Status On-going   PT SHORT TERM GOAL #4   Title assess for need for lower extremity orthotic (R, L, bil) and determine appropriate device if indicated (03/28/14)   Status On-going          PT Long Term Goals - 03/14/14 1028    PT LONG TERM GOAL #1   Title verbalize understanding of fall prevention strategies and energy conservation (04/25/14)   Status On-going   PT LONG TERM GOAL #2   Title improve BERG balance score to > 36/56 for decreased fall risk and  improved function (04/25/14)   Status On-going   PT LONG TERM GOAL #3   Title improve timed up and go to < 13 sec for improved mobility (04/25/14)   Status On-going   PT LONG TERM GOAL #4   Title ambulate > 500' on paved outdoor/various indoor surfaces with LRAD modified independent for improved functional mobility. (04/25/14)   Status On-going          Plan - 03/14/14 1025    Clinical Impression Statement Pt continues to experience B genu recurvatum during ambulation with B blue rocker AFO donned, but recurvatum decr. with L [place] AFO and pt reported decr. L knee pain from 9/10 to 6-7/10 during ambulation. PT will fax orthotist and MD to set up AFO fitting and request for rollator. Pt would continue to benefit from skilled therapy to improve safety during functional mobility.   Pt will benefit from skilled therapeutic intervention in order to improve on the following deficits Abnormal gait;Decreased activity tolerance;Decreased balance;Decreased knowledge of use of DME;Decreased strength;Impaired perceived functional ability;Pain;Decreased knowledge of precautions;Decreased endurance;Difficulty walking   PT Frequency 2x / week   PT Duration 8 weeks   PT Treatment/Interventions ADLs/Self Care Home Management;Gait training;Neuromuscular re-education;Functional mobility training;Patient/family education;Therapeutic activities;Electrical Stimulation;Therapeutic exercise;Manual techniques;Energy conservation;Balance training;Moist Heat;Cryotherapy   PT Next Visit Plan Gait training and balance training.   Consulted and Agree with Plan of Care Patient        Problem List Patient Active Problem List   Diagnosis Date Noted  . Headache 10/28/2013  . Multiple sclerosis 08/23/2013  . Gait difficulty 02/22/2013                                              Koray Soter L 03/14/2014, 10:31 AM     Zerita BoersJennifer Liylah Najarro, PT,DPT 03/14/2014 10:31  AM Phone: [phone] Fax: [phone]

## 2014-03-14 NOTE — Patient Instructions (Signed)
Verbally reviewed OT POC and goals, pt wishes to focus on ADL retraining and coordination to assist with increasing independence for self care and home making tasks. Pt. presents to [facility] with balance and gait deficits affecting mobility and ADLs, slef care tasks. She will benefit from OT to assist in maximizing ADL independence, a/e instruction, pt/family education, functional mobility and decrease fall risk.Pt verbally agreed with this.

## 2014-03-16 ENCOUNTER — Encounter: Payer: Self-pay | Admitting: Occupational Therapy

## 2014-03-16 ENCOUNTER — Ambulatory Visit: Payer: 59 | Attending: Neurology | Admitting: Occupational Therapy

## 2014-03-16 ENCOUNTER — Ambulatory Visit (INDEPENDENT_AMBULATORY_CARE_PROVIDER_SITE_OTHER): Payer: 59

## 2014-03-16 DIAGNOSIS — M6281 Muscle weakness (generalized): Secondary | ICD-10-CM | POA: Insufficient documentation

## 2014-03-16 DIAGNOSIS — R269 Unspecified abnormalities of gait and mobility: Secondary | ICD-10-CM | POA: Diagnosis not present

## 2014-03-16 DIAGNOSIS — G35 Multiple sclerosis: Secondary | ICD-10-CM

## 2014-03-16 DIAGNOSIS — Z5189 Encounter for other specified aftercare: Secondary | ICD-10-CM | POA: Insufficient documentation

## 2014-03-16 DIAGNOSIS — R279 Unspecified lack of coordination: Secondary | ICD-10-CM

## 2014-03-16 DIAGNOSIS — R278 Other lack of coordination: Secondary | ICD-10-CM

## 2014-03-16 MED ORDER — GADOPENTETATE DIMEGLUMINE 469.01 MG/ML IV SOLN: 19.0000 mL | Freq: Once | INTRAVENOUS | Status: AC | PRN

## 2014-03-16 NOTE — Therapy (Signed)
[facility] [address] [place], [place], [address] Phone: [phone]   Fax:  [phone]  Occupational Therapy Treatment  Patient Details  Name: Goldye Lehenbauer MRN: [id number] Date of Birth: 07-02-1970  Encounter Date: 03/16/2014      OT End of Session - 03/16/14 0931    Visit Number 2   Number of Visits 16   Date for OT Re-Evaluation 05/02/14   OT Start Time 0847   OT Stop Time 0929   OT Time Calculation (min) 42 min   Activity Tolerance Patient limited by fatigue   Behavior During Therapy [facility] for tasks assessed/performed      Past Medical History  Diagnosis Date  . Migraines   . Ankle fracture, right   . Knee joint dislocation     bilateral  . History of hysterectomy     Past Surgical History  Procedure Laterality Date  . Abdominal hysterectomy    . Myomectomy    . Cesarean section      There were no vitals taken for this visit.  Visit Diagnosis:  MS (multiple sclerosis)  Decreased coordination      Subjective Assessment - 03/16/14 0856    Pain Location Teeth  Hip and knee pain            OT Treatments/Exercises (OP) - 03/16/14 0001    ADLs   Grooming Discussed sitting for grooming to conserve energy   LB Dressing --  Don/doff pants, shoes using A/E seated in chair   ADL Comments Recommend sitting for ADL's and energy conservation techniques          OT Education - 03/16/14 0929    Education provided Yes   Education Details Energy conservation techniques, LH A/E for LB ADL's   Person(s) Educated Patient;Parent(s)   Methods Explanation;Demonstration;Verbal cues;Handout   Comprehension Verbalized understanding     Discussed energy conservation techniques with pt and long handled A/E for LB dressing to assist in conserving energy. Performed LB dressing using LH A/E w/ vc's to not bend as much at waist but to let A/E do the work for her. Also recommended sitting in chair with arm rests for  increased support during ADL's. Handouts issued for Energy conservation techniques. Reviewed verbally with pt in clinic and she verbalized understanding.        Plan - 03/16/14 0932    Clinical Impression Statement Pt was educated in energy conservation techniques today and ADL 's for LB dressing w/ LH A/E. She will benefit from further a/e instruction and pt/family education to assist in maximizing independence with ADL's and coordination.   Plan ADL's and a/e instruction, coordination techniques.   Consulted and Agree with Plan of Care Patient;Family member/caregiver   Family Member Consulted Patient and her mother                               Problem List Patient Active Problem List   Diagnosis Date Noted  . Headache 10/28/2013  . Multiple sclerosis 08/23/2013  . Gait difficulty 02/22/2013    Alm Bustard, OT  03/16/2014, 9:36 AM

## 2014-03-16 NOTE — Patient Instructions (Signed)
Discussed energy conservation techniques with pt and long handled A/E for LB dressing to assist in conserving energy. Performed LB dressing using LH A/E w/ vc's to not bend as much at waist but to let A/E do the work for her. Also recommended sitting in chair with arm rests for increased support during ADL's. Handouts issued for Energy conservation techniques. Reviewed verbally with pt in clinic and she verbalized understanding.

## 2014-03-17 ENCOUNTER — Ambulatory Visit: Payer: 59

## 2014-03-17 DIAGNOSIS — R269 Unspecified abnormalities of gait and mobility: Secondary | ICD-10-CM

## 2014-03-17 DIAGNOSIS — R296 Repeated falls: Secondary | ICD-10-CM

## 2014-03-17 DIAGNOSIS — Z5189 Encounter for other specified aftercare: Secondary | ICD-10-CM | POA: Diagnosis not present

## 2014-03-17 DIAGNOSIS — R531 Weakness: Secondary | ICD-10-CM

## 2014-03-17 NOTE — Patient Instructions (Signed)
Perform all balance exercises in corner with a chair in front of you for safety.  Feet Apart, Head Motion - Eyes Open   With eyes open, feet apart, move head slowly: up and down and side to side for 30 seconds. Repeat __3__ times per session. Do __1-2__ sessions per day.  Copyright  VHI. All rights reserved.  Feet Apart, Arm Motion - Eyes Closed   With eyes closed and feet shoulder width apart, keep arms at your side and hold for 30 seconds. Repeat __3__ times per session. Do __1-2__ sessions per day.  Copyright  VHI. All rights reserved.  Feet Together, Varied Arm Positions - Eyes Open   With eyes open, feet together, arms at your side, look straight ahead at a stationary object. Hold _30___ seconds. Repeat _3___ times per session. Do _1-2___ sessions per day.  Copyright  VHI. All rights reserved.  Feet Partial Heel-Toe, Varied Arm Positions - Eyes Open   With eyes open, right foot partially in front of the other, arms at your side, look straight ahead at a stationary object. Hold _10-30___ seconds. Repeat with left foot partially in front of the other. Repeat _3___ times per session. Do _1-2___ sessions per day.  Copyright  VHI. All rights reserved.

## 2014-03-17 NOTE — Therapy (Signed)
[facility] [address] [place], [place], [address] Phone: [phone]   Fax:  [phone]  Physical Therapy Treatment  Patient Details  Name: Katerina Zurn MRN: [id number] Date of Birth: 1970/09/26  Encounter Date: 03/17/2014      PT End of Session - 03/17/14 1507    Visit Number 5   Number of Visits 16   Date for PT Re-Evaluation 04/29/14   PT Start Time 0805   PT Stop Time 0845   PT Time Calculation (min) 40 min   Equipment Utilized During Treatment Gait belt   Activity Tolerance Patient limited by fatigue;Patient limited by pain   Behavior During Therapy [facility] for tasks assessed/performed      Past Medical History  Diagnosis Date  . Migraines   . Ankle fracture, right   . Knee joint dislocation     bilateral  . History of hysterectomy     Past Surgical History  Procedure Laterality Date  . Abdominal hysterectomy    . Myomectomy    . Cesarean section      There were no vitals taken for this visit.  Visit Diagnosis:  Gait difficulty  Abnormality of gait  Weakness generalized  Falls frequently      Subjective Assessment - 03/17/14 0809    Symptoms Pt arrived 5 minutes late. Pt denied any changes or falls since last visit.   Limitations Standing;Walking;House hold activities   How long can you stand comfortably? 5 min   How long can you walk comfortably? 5 min   Patient Stated Goals improve mobility; walk, standing and peform household chores   Currently in Pain? Yes   Pain Score 7    Pain Location Leg   Pain Orientation Right;Left   Pain Descriptors / Indicators Aching;Burning   Pain Type Chronic pain   Pain Onset More than a month ago   Pain Frequency Constant   Aggravating Factors  Weight bearing for more than 5 minutes   Pain Relieving Factors none   Effect of Pain on Daily Activities Limits walking, standing and mobililty   Multiple Pain Sites Yes   Pain Score 8   Pain Type Acute pain  pt  reported medication decr. headache pain   Pain Location Head   Pain Descriptors / Indicators Aching   Pain Frequency Rarely            [facility] Adult PT Treatment/Exercise - 03/17/14 0805    Ambulation/Gait   Ambulation/Gait Yes   Ambulation/Gait Assistance 4: Min guard;5: Supervision   Ambulation/Gait Assistance Details Pt ambulated without AFOs donned, while performing head turns. VC's to decr. scissor gait during turns and to improve B heel strike. Pt required rest breaks after ambulation due to fatigue.   Ambulation Distance (Feet) --  460'x2, 117'   Assistive device Rollator   Gait Pattern Step-through pattern;Decreased stride length;Ataxic;Scissoring;Poor foot clearance - left;Poor foot clearance - right;Lateral hip instability;Decreased trunk rotation;Narrow base of support;Left genu recurvatum   Gait velocity decreased     Neuro re-ed:  Performed in a corner with a chair in front of patient and CGA for safety. All performed 3 sets with 10-30 second holds. One seated rest break required due to fatigue. -Feet apart with and without eyes closed, feet apart with head turns, feet together arms at side, modified tandem (B LE). VC's for technique and to improve weight shifting ant/post when pt experienced incr. Postural sway.      PT Education - 03/17/14 1506  Education provided Yes   Education Details Balance HEP   Person(s) Educated Patient;Spouse   Methods Explanation   Comprehension Verbalized understanding;Returned demonstration          PT Short Term Goals - 03/17/14 1512    PT SHORT TERM GOAL #1   Title independent with HEP (03/28/14)   Status On-going   PT SHORT TERM GOAL #2   Title improve BERG balance score to > 30/56 for improved balance (03/28/14)   Status On-going   PT SHORT TERM GOAL #3   Title ambulate > 250' with LRAD modified independent for improved functional mobility (03/28/14)   Status On-going   PT SHORT TERM GOAL #4   Title assess for need for  lower extremity orthotic (R, L, bil) and determine appropriate device if indicated (03/28/14)   Status On-going          PT Long Term Goals - 03/17/14 1513    PT LONG TERM GOAL #1   Title verbalize understanding of fall prevention strategies and energy conservation (04/25/14)   Status On-going   PT LONG TERM GOAL #2   Title improve BERG balance score to > 36/56 for decreased fall risk and improved function (04/25/14)   Status On-going   PT LONG TERM GOAL #3   Title improve timed up and go to < 13 sec for improved mobility (04/25/14)   Status On-going   PT LONG TERM GOAL #4   Title ambulate > 500' on paved outdoor/various indoor surfaces with LRAD modified independent for improved functional mobility. (04/25/14)   Status On-going          Plan - 03/17/14 1507    Clinical Impression Statement Pt demonstrated progress as she was able to tolerate balance HEP and ambulated longer distances. Pt continues to be limited by fatigue. Pt would continue to benefit from skilled therapy to improve safety during functional mobility.   Pt will benefit from skilled therapeutic intervention in order to improve on the following deficits Abnormal gait;Decreased activity tolerance;Decreased balance;Decreased knowledge of use of DME;Decreased strength;Impaired perceived functional ability;Pain;Decreased knowledge of precautions;Decreased endurance;Difficulty walking   Rehab Potential Good   PT Frequency 2x / week   PT Duration 8 weeks   PT Treatment/Interventions ADLs/Self Care Home Management;Gait training;Neuromuscular re-education;Functional mobility training;Patient/family education;Therapeutic activities;Electrical Stimulation;Therapeutic exercise;Manual techniques;Energy conservation;Balance training;Moist Heat;Cryotherapy   PT Next Visit Plan Dynamic gait training, weight shifting balance activiites.   PT Home Exercise Plan decrease to only 1x a day as directed on HEP and not on days she has therapy or  lots of activity to prevent overdoing it/increasing pain.   Consulted and Agree with Plan of Care Patient   Family Member Consulted spouse                               Problem List Patient Active Problem List   Diagnosis Date Noted  . Headache 10/28/2013  . Multiple sclerosis 08/23/2013  . Gait difficulty 02/22/2013    Maiya Kates L 03/17/2014, 3:15 PM     Zerita BoersJennifer Leahann Lempke, PT,DPT 03/17/2014 3:15 PM Phone: [phone] Fax: [phone]

## 2014-03-21 ENCOUNTER — Ambulatory Visit: Payer: 59 | Admitting: Occupational Therapy

## 2014-03-21 ENCOUNTER — Ambulatory Visit: Payer: 59

## 2014-03-21 ENCOUNTER — Encounter: Payer: Self-pay | Admitting: Occupational Therapy

## 2014-03-21 DIAGNOSIS — R531 Weakness: Secondary | ICD-10-CM

## 2014-03-21 DIAGNOSIS — R269 Unspecified abnormalities of gait and mobility: Secondary | ICD-10-CM

## 2014-03-21 DIAGNOSIS — Z5189 Encounter for other specified aftercare: Secondary | ICD-10-CM | POA: Diagnosis not present

## 2014-03-21 DIAGNOSIS — R279 Unspecified lack of coordination: Secondary | ICD-10-CM

## 2014-03-21 DIAGNOSIS — R296 Repeated falls: Secondary | ICD-10-CM

## 2014-03-21 DIAGNOSIS — R278 Other lack of coordination: Secondary | ICD-10-CM

## 2014-03-21 NOTE — Therapy (Signed)
[facility] [address] [place], [place], [address] Phone: [phone]   Fax:  [phone]  Physical Therapy Treatment  Patient Details  Name: Dana HaggardDesrene Paulos MRN: [id number] Date of Birth: 10/22/1970  Encounter Date: 03/21/2014      PT End of Session - 03/21/14 1243    Visit Number 6   Number of Visits 16   Date for PT Re-Evaluation 04/29/14   PT Start Time 1146   PT Stop Time 1228   PT Time Calculation (min) 42 min   Equipment Utilized During Treatment Gait belt   Activity Tolerance Patient limited by fatigue;Patient tolerated treatment well   Behavior [facility] for tasks assessed/performed      Past Medical History  Diagnosis Date  . Migraines   . Ankle fracture, right   . Knee joint dislocation     bilateral  . History of hysterectomy     Past Surgical History  Procedure Laterality Date  . Abdominal hysterectomy    . Myomectomy    . Cesarean section      There were no vitals taken for this visit.  Visit Diagnosis:  Gait difficulty  Abnormality of gait  Weakness generalized  Falls frequently      Subjective Assessment - 03/21/14 1150    Symptoms Pt denied falls or changes since last visit. Pt reported she was more tired than usual after shopping this weekend, she had to sit due to impaired balance. Pt reported she has been performing HEP as prescribed, and not on the days when she has therapy to conserve energy.   Limitations Standing;Walking;House hold activities   How long can you stand comfortably? 5 min   How long can you walk comfortably? 5 min   Patient Stated Goals improve mobility; walk, standing and peform household chores   Currently in Pain? Yes   Pain Score --  LLE: 8/10, RLE: 6/10   Pain Location Leg   Pain Orientation Right;Left   Pain Descriptors / Indicators Aching;Burning   Pain Type Chronic pain   Pain Onset More than a month ago   Aggravating Factors  weight bearing for  more than 5 minutes.   Pain Relieving Factors none.            [facility] Adult PT Treatment/Exercise - 03/21/14 1200    Ambulation/Gait   Ambulation/Gait Yes   Ambulation/Gait Assistance 4: Min guard   Ambulation/Gait Assistance Details Pt ambulated without AFOs donned, while performing head turns and 180 degree turns. VC's to improve stride length and upright posture. Pt noted to not experience scissoring gait pattern during turns today. Pt required one, two minute seaeted rest break, and one-one minute seated rest break due to fatigue.   Ambulation Distance (Feet) --  460', 230', 10455' all with rollator; 50',75' without rollator   Assistive device Rollator   Gait Pattern Step-through pattern;Decreased stride length;Ataxic;Poor foot clearance - left;Poor foot clearance - right;Lateral hip instability;Decreased trunk rotation;Narrow base of support;Left genu recurvatum   Gait velocity decreased   Balance   Balance Assessed Yes   Static Standing Balance   Static Standing - Comment/# of Minutes --   Dynamic Standing Balance   Dynamic Standing - Balance Support No upper extremity supported;Right upper extremity supported   Dynamic Standing - Level of Assistance 4: Min assist;Other (comment)  min guard   Dynamic Standing - Balance Activities Alternating  foot traps  cone taps and 2" step taps   Dynamic Standing - Comments B LE toe/heel  taps: on dots 2x10, 2" step 2x10 all without UE support. In parallel bars with R UE support:cone taps with B LE 2x10. Pt required min A during 7 LOB episodes, otherwise min guard for safety. VC's to improve lateral weight shifting and L hip flexion. Pt required seated rest breaks after each set due to fatigue.          PT Education - 03/21/14 1242    Education provided Yes   Education Details PT discussed the importance of performing HEP once per day and not on PT days to conserve energy. PT reviewed HEP with pt and provided pt with new yellow theraband, as  pt reported her band was losing elasticity.   Person(s) Educated Patient   Methods Explanation   Comprehension Verbalized understanding          PT Short Term Goals - 03/21/14 1246    PT SHORT TERM GOAL #1   Title independent with HEP (03/28/14)   Status On-going   PT SHORT TERM GOAL #2   Title improve BERG balance score to > 30/56 for improved balance (03/28/14)   Status On-going   PT SHORT TERM GOAL #3   Title ambulate > 250' with LRAD modified independent for improved functional mobility (03/28/14)   Status On-going   PT SHORT TERM GOAL #4   Title assess for need for lower extremity orthotic (R, L, bil) and determine appropriate device if indicated (03/28/14)   Status On-going          PT Long Term Goals - 03/21/14 1247    PT LONG TERM GOAL #1   Title verbalize understanding of fall prevention strategies and energy conservation (04/25/14)   Status On-going   PT LONG TERM GOAL #2   Title improve BERG balance score to > 36/56 for decreased fall risk and improved function (04/25/14)   Status On-going   PT LONG TERM GOAL #3   Title improve timed up and go to < 13 sec for improved mobility (04/25/14)   Status On-going   PT LONG TERM GOAL #4   Title ambulate > 500' on paved outdoor/various indoor surfaces with LRAD modified independent for improved functional mobility. (04/25/14)   Status On-going          Plan - 03/21/14 1244    Clinical Impression Statement Pt demonstrated progress, as pt required less VC's to improve weight shifting and stride length during last 200' of ambulation. Pt continues to be limited by fatigue and impaired balance. Pt would continue to benefit from PT to improve safety during functional mobilitly.   Pt will benefit from skilled therapeutic intervention in order to improve on the following deficits Abnormal gait;Decreased activity tolerance;Decreased balance;Decreased knowledge of use of DME;Decreased strength;Impaired perceived functional  ability;Pain;Decreased knowledge of precautions;Decreased endurance;Difficulty walking   PT Frequency 2x / week   PT Duration 8 weeks   PT Treatment/Interventions ADLs/Self Care Home Management;Gait training;Neuromuscular re-education;Functional mobility training;Patient/family education;Therapeutic activities;Electrical Stimulation;Therapeutic exercise;Manual techniques;Energy conservation;Balance training;Moist Heat;Cryotherapy   PT Next Visit Plan Dynamic gait training, weight shifting balance activites-try step ups.   PT Home Exercise Plan Reiterated the importance of performing HEP only once per day to conserve energy.   Consulted and Agree with Plan of Care Patient                               Problem List Patient Active Problem List   Diagnosis Date Noted  . Headache 10/28/2013  .  Multiple sclerosis 08/23/2013  . Gait difficulty 02/22/2013    Lequan Dobratz L 03/21/2014, 12:50 PM     Zerita Boers, PT,DPT 03/21/2014 12:50 PM Phone: [phone] Fax: [phone]

## 2014-03-21 NOTE — Patient Instructions (Signed)
Reinforced with patient, with mother present, the importance of energy conservation in all activities of daily living. Reviewed the importance of balancing work / activity and rest. Discussed the benefits of being able to sustain and build on current activity level - by preserving level of energy expenditure, and preventing exhaustion. Also reviewed the importance of slowing down speed of movement to increase safety / decrease falls and potential for falls.  Patient is beginnig to make accommodations to her activities to include increased periods of seated rest, increased periods of seated activity, and even occasionally accepting assistance from family.   Discussed how important these skills were for sustaining activity level with progressive disease process.

## 2014-03-21 NOTE — Therapy (Signed)
[facility] [address] [place], [place], [address] Phone: [phone]   Fax:  [phone]  Occupational Therapy Treatment  Patient Details  Name: Dana Duke MRN: [id number] Date of Birth: 05-Feb-1971  Encounter Date: 03/21/2014      OT End of Session - 03/21/14 1216    Visit Number 3   Number of Visits 16   Date for OT Re-Evaluation 05/02/14   OT Start Time 1100   OT Stop Time 1145   OT Time Calculation (min) 45 min   Activity Tolerance Patient tolerated treatment well  First session, PT to follow OT today   Behavior During Therapy [facility] for tasks assessed/performed      Past Medical History  Diagnosis Date  . Migraines   . Ankle fracture, right   . Knee joint dislocation     bilateral  . History of hysterectomy     Past Surgical History  Procedure Laterality Date  . Abdominal hysterectomy    . Myomectomy    . Cesarean section      There were no vitals taken for this visit.  Visit Diagnosis:  Weakness generalized  Decreased coordination      Subjective Assessment - 03/21/14 1158    Symptoms Patiet presents to OT session today with her mother present.  Patient continues to report fatigue and poor balance / coordination which are impeding daily performance of ADL/IADL.    Repetition Increases Symptoms   Patient Stated Goals Patient eager for increased independence with lower body dressing   Currently in Pain? Yes   Pain Location Leg   Pain Orientation Right;Left   Pain Descriptors / Indicators Aching   Pain Type Chronic pain   Pain Onset More than a month ago   Pain Frequency Constant            OT Treatments/Exercises (OP) - 03/21/14 0001    ADLs   LB Dressing Practiced donning brief, socks, shoes from seated position.  Encouraged use of reacher to avoid multiple forward bends toward floor.  Encouraged alternating rest and activity even within task of dressing herself.     Bathing Reviewed safest  process to enter tub as sitting and putting legs in versus standing and stepping over tub.  Encouraged increased periods of time seated for shower.  Patient has tub transfer bench    Functional Mobility Increased forward and backward as wella s side to side sway with walking.  Patient with tendency to stabilize on furniture, walls, counters for increased support.     Cooking Able to obtain a cup full of water and carry across a room with min assist for balance   Exercises   Exercises --  static standing balance exercises    Fine Motor Coordination   Fine Motor Coordination Small Pegboard  Patient with poor proximal stability,poor fine motor left          OT Education - 03/21/14 1215    Education provided Yes   Education Details Educated patient on energy conservation as related to dressing and bathing activities   Person(s) Educated Patient;Parent(s)   Methods Explanation;Demonstration;Verbal cues   Comprehension Verbalized understanding          OT Short Term Goals - 03/21/14 1221    OT SHORT TERM GOAL #1   Title Pt will be I home program for coordination bilateral UE's   Baseline 04/04/14   OT SHORT TERM GOAL #2   Title Pt will I'ly state 2-3 energy conservation techniques that  she can implement during ADL's   Baseline 04/04/14   OT SHORT TERM GOAL #3   Title Pt will be Min A LB dressing using a/e PRN    Baseline 04/04/14          OT Long Term Goals - 03/21/14 1222    OT LONG TERM GOAL #1   Title Pt will be Mod I upated HEP   Baseline 05/02/14   OT LONG TERM GOAL #2   Title Pt will be Mod I LB dressing using a/e PRN seated and with sit to stand.   Baseline 05/02/14   OT LONG TERM GOAL #3   Title Pt will demonstrate Mod I basic ADL's and homemaking tasks using a/e PRN   Baseline 05/02/14   OT LONG TERM GOAL #4   Title Pt will be Mod I simple snack/meal prep while implementing energy conservation techniques   Baseline 05/02/14          Plan - 03/21/14 1217     Clinical Impression Statement Patient will benefit from continued skilled OT services to enhance her independence with ADL/IADL, improve her knowledge and use of energy conservation, and improve her overall strength, balance, and coordination.   Rehab Potential Good   Clinical Impairments Affecting Rehab Potential Limited endurance for back to back sessions   OT Frequency 2x / week   OT Duration 8 weeks   OT Treatment/Interventions Self-care/ADL training;Therapeutic exercise;Neuromuscular education;Energy conservation;DME and/or AE instruction;Therapeutic activities;Patient/family education;Balance training;Functional Development worker, communityMobility Training;Therapeutic exercises   Plan Patient unable to use reacher from hardware store due to its weight.  Allowed patient to borrow lightweight reacher from clinic - able to effectively use when seated in chair.  Needs to work on postural control, core strengthening, static to dynamic stand balance, and energy conservation   Consulted and Agree with Plan of Care Patient;Family member/caregiver   Family Member Consulted Patient and her mother                               Problem List Patient Active Problem List   Diagnosis Date Noted  . Headache 10/28/2013  . Multiple sclerosis 08/23/2013  . Gait difficulty 02/22/2013    Collier SalinaGellert, Kristin M 03/21/2014, 12:31 PM

## 2014-03-22 ENCOUNTER — Telehealth: Payer: Self-pay | Admitting: Neurology

## 2014-03-22 NOTE — Telephone Encounter (Signed)
Dana Duke, she should have a follow up visit.

## 2014-03-22 NOTE — Progress Notes (Signed)
Quick Note:  Release result to my chart, will discuss detail at follow up visit ______

## 2014-03-23 NOTE — Progress Notes (Signed)
Quick Note:  Spoke to patient relayed Dr.Yan will go over results at her follow up. Patient understood. ______

## 2014-03-23 NOTE — Progress Notes (Signed)
Quick Note:  Spoke to patient she will will keep her appt. For Dr.Yan to go over MRI results. ______

## 2014-03-23 NOTE — Telephone Encounter (Signed)
Spoke to patient and she wants to keep her follow for 21 st with Dr.Yan.

## 2014-03-24 ENCOUNTER — Encounter: Payer: Self-pay | Admitting: Physical Therapy

## 2014-03-24 ENCOUNTER — Encounter: Payer: Self-pay | Admitting: Occupational Therapy

## 2014-03-24 ENCOUNTER — Ambulatory Visit: Payer: 59 | Admitting: Physical Therapy

## 2014-03-24 ENCOUNTER — Ambulatory Visit: Payer: 59 | Admitting: Occupational Therapy

## 2014-03-24 DIAGNOSIS — R279 Unspecified lack of coordination: Secondary | ICD-10-CM

## 2014-03-24 DIAGNOSIS — R269 Unspecified abnormalities of gait and mobility: Secondary | ICD-10-CM

## 2014-03-24 DIAGNOSIS — G35 Multiple sclerosis: Secondary | ICD-10-CM

## 2014-03-24 DIAGNOSIS — R531 Weakness: Secondary | ICD-10-CM

## 2014-03-24 DIAGNOSIS — R278 Other lack of coordination: Secondary | ICD-10-CM

## 2014-03-24 DIAGNOSIS — Z7409 Other reduced mobility: Secondary | ICD-10-CM

## 2014-03-24 DIAGNOSIS — Z5189 Encounter for other specified aftercare: Secondary | ICD-10-CM | POA: Diagnosis not present

## 2014-03-24 DIAGNOSIS — R296 Repeated falls: Secondary | ICD-10-CM

## 2014-03-24 NOTE — Therapy (Signed)
[facility] [address] [place], [place], [address] Phone: [phone]   Fax:  [phone]  Physical Therapy Treatment  Patient Details  Name: Leonna Caird MRN: [id number] Date of Birth: 02-13-1971  Encounter Date: 03/24/2014      PT End of Session - 03/24/14 1244    Visit Number 7   Number of Visits 16   Date for PT Re-Evaluation 04/29/14   PT Start Time 0930   PT Stop Time 1013   PT Time Calculation (min) 43 min   Equipment Utilized During Treatment Gait belt   Activity Tolerance Patient limited by fatigue   Behavior During Therapy [facility] for tasks assessed/performed      Past Medical History  Diagnosis Date  . Migraines   . Ankle fracture, right   . Knee joint dislocation     bilateral  . History of hysterectomy     Past Surgical History  Procedure Laterality Date  . Abdominal hysterectomy    . Myomectomy    . Cesarean section      There were no vitals taken for this visit.  Visit Diagnosis:  Weakness generalized  Decreased coordination  Gait difficulty  Abnormality of gait  Falls frequently  MS (multiple sclerosis)      Subjective Assessment - 03/24/14 0933    Symptoms Pt reports feeling fatiqued about 24 hours after her injections and last for 2-3 days.  Gets injections every 14 days.  Denies falls but does stumble.   Currently in Pain? Yes   Pain Score 8    Pain Location Leg   Pain Orientation Left   Pain Descriptors / Indicators Burning   Pain Type Chronic pain   Pain Onset More than a month ago   Aggravating Factors  weight bearing   Multiple Pain Sites Yes   Pain Score 4   Pain Type Chronic pain   Pain Location Leg   Pain Orientation Right   Pain Frequency Constant            [facility] Adult PT Treatment/Exercise - 03/24/14 1231    Transfers   Transfers Sit to Stand;Stand to Sit   Sit to Stand 5: Supervision;From elevated surface;Without upper extremity assist;Other/comment   worked on sit-stand from various surface heights   Sit to Stand Details (indicate cue type and reason) vc's for sequence and to control balance once standing   Stand to Sit 5: Supervision;Without upper extremity assist;Other (comment)  to various surface heights   Stand to Sit Details repeated multiple times to control descent   Ambulation/Gait   Ambulation/Gait Yes   Ambulation/Gait Assistance 4: Min guard   Ambulation/Gait Assistance Details cues on posture, step length and heel strike   Ambulation Distance (Feet) 220 Feet   Assistive device Rollator   Gait Pattern Step-through pattern;Decreased stride length;Ataxic;Poor foot clearance - left;Poor foot clearance - right;Lateral hip instability;Decreased trunk rotation;Narrow base of support;Left genu recurvatum   Gait velocity decreased   Knee/Hip Exercises: Supine   Bridges AROM;Both;2 sets;10 reps   Other Supine Knee Exercises bridging x 10 with bil marching and min assist   Other Supine Knee Exercises bil hip abd with red theraband x 10          PT Education - 03/24/14 1243    Education provided Yes   Education Details Controlling descent with stand to sit   Person(s) Educated Patient   Methods Explanation;Demonstration   Comprehension Verbalized understanding;Returned demonstration  Plan - 03/24/14 1244    Clinical Impression Statement Pt continues to demonstrate progress and appears motivated to improve strength and mobility.  Limited by fatigue during session and needs frequent short rest breaks durng session.   Rehab Potential Good   PT Frequency 2x / week   PT Duration 8 weeks   PT Treatment/Interventions ADLs/Self Care Home Management;Gait training;Neuromuscular re-education;Functional mobility training;Patient/family education;Therapeutic activities;Electrical Stimulation;Therapeutic exercise;Manual techniques;Energy conservation;Balance training;Moist Heat;Cryotherapy   PT Next Visit Plan Advanced  Prosthetics to come on 12/14 visit to assess for AFO's.   Consulted and Agree with Plan of Care Patient           Balance Exercises - 03/24/14 1239    Balance Exercises: Seated   Other Seated Exercises Seated on blue therapy ball for bouncing, rocking forward/backward, rocking side/side, hip flexion and LAQ.  Needed min assist to balance.      Problem List Patient Active Problem List   Diagnosis Date Noted  . Headache 10/28/2013  . Multiple sclerosis 08/23/2013  . Gait difficulty 02/22/2013    Newell CoralRobertson, Traevon Meiring Terry 03/24/2014, 12:54 PM     Newell Coralenise Terry Armelia Penton, [place] [facility] 03/24/2014 12:54 PM Phone: [phone] Fax: [phone]

## 2014-03-24 NOTE — Patient Instructions (Signed)
Pt instructed on LH shoe horn and button hook.  Loaned equipment until next visit to allow pt to determine if she wishes to purchase.  Pt to return equipment next visit.

## 2014-03-24 NOTE — Therapy (Signed)
[facility] [address] [place], [place], [address] Phone: [phone]   Fax:  [phone]  Occupational Therapy Treatment  Patient Details  Name: Dana HaggardDesrene Haberland MRN: [id number] Date of Birth: 07/15/1970  Encounter Date: 03/24/2014      OT End of Session - 03/24/14 0946    Visit Number 4   Number of Visits 16   Date for OT Re-Evaluation 05/02/14   OT Start Time 0848   OT Stop Time 0931   OT Time Calculation (min) 43 min      Past Medical History  Diagnosis Date  . Migraines   . Ankle fracture, right   . Knee joint dislocation     bilateral  . History of hysterectomy     Past Surgical History  Procedure Laterality Date  . Abdominal hysterectomy    . Myomectomy    . Cesarean section      There were no vitals taken for this visit.  Visit Diagnosis:  Weakness generalized  Decreased coordination  Impaired functional mobility, balance, and endurance      Subjective Assessment - 03/24/14 0931    Symptoms "I get tired very easily"   Pertinent History see epic snapshot   Currently in Pain? Yes  LE pain - see PT note            OT Treatments/Exercises (OP) - 03/24/14 0001    ADLs   LB Dressing Practiced use of LH shoe horn and button hook. Loaned equipment to pt until next visit so that she can try it and determine if she wants it.  Pt stated strategies shown to her in last visit were very helpful - [facility] that she [facility] last visit.   Neurological Re-education Exercises   Other Exercises 1 Neuro re ed to address dynamic standing balance with transitional movements both left and right with varying height surfaces to work on endurance, dynamic standing balance without UE support, transitional movements and habituation to decrease dizziness during functional tasks with head turns.          OT Education - 03/24/14 0946    Education provided Yes   Education Details ADL AE   Person(s)  Educated Patient   Methods Explanation;Demonstration;Verbal cues   Comprehension Verbalized understanding;Returned demonstration          OT Short Term Goals - 03/24/14 0951    OT SHORT TERM GOAL #1   Title Pt will be I home program for coordination bilateral UE's - 04/04/2014   Baseline 04/04/14   Status On-going   OT SHORT TERM GOAL #2   Title Pt will I'ly state 2-3 energy conservation techniques that she can implement during ADL's   Baseline 04/04/14   Status On-going   OT SHORT TERM GOAL #3   Title Pt will be Min A LB dressing using a/e PRN    Baseline 04/04/14   Status On-going          OT Long Term Goals - 03/24/14 0951    OT LONG TERM GOAL #1   Title Pt will be Mod I upated HEP - 05/02/2014   Baseline 05/02/14   Status On-going   OT LONG TERM GOAL #2   Title Pt will be Mod I LB dressing using a/e PRN seated and with sit to stand.   Baseline 05/02/14   Status On-going   OT LONG TERM GOAL #3   Title Pt will demonstrate Mod I basic ADL's and homemaking tasks using a/e  PRN   Baseline 05/02/14   Status On-going   OT LONG TERM GOAL #4   Title Pt will be Mod I simple snack/meal prep while implementing energy conservation techniques   Baseline 05/02/14   Status On-going          Plan - 03/24/14 0947    Clinical Impression Statement Pt very motivated to address balance and endurance during session today. Pt states equipment recommendations have been helpfult   Rehab Potential Good   Clinical Impairments Affecting Rehab Potential Limited endurance for back to back sessions   OT Frequency 2x / week   OT Duration 8 weeks   OT Treatment/Interventions Self-care/ADL training;Therapeutic exercise;Neuromuscular education;Energy conservation;DME and/or AE instruction;Therapeutic activities;Patient/family education;Balance training;Functional Development worker, community;Therapeutic exercises   Plan check AE loaned to pt (LH shoe horn and button hook); address balance, endurance,  dizziness.  Keep fatigue at or below 7.                               Problem List Patient Active Problem List   Diagnosis Date Noted  . Headache 10/28/2013  . Multiple sclerosis 08/23/2013  . Gait difficulty 02/22/2013   Mackie Pai, MS, OTR/L 03/24/2014 9:54 AM Phone: [phone] Fax: [phone]   Norton Pastel 03/24/2014, 9:53 AM

## 2014-03-25 ENCOUNTER — Ambulatory Visit: Payer: 59 | Admitting: Neurology

## 2014-03-28 ENCOUNTER — Ambulatory Visit: Payer: 59 | Admitting: Occupational Therapy

## 2014-03-28 ENCOUNTER — Telehealth: Payer: Self-pay

## 2014-03-28 ENCOUNTER — Ambulatory Visit: Payer: 59

## 2014-03-28 ENCOUNTER — Encounter: Payer: Self-pay | Admitting: Occupational Therapy

## 2014-03-28 DIAGNOSIS — Z5189 Encounter for other specified aftercare: Secondary | ICD-10-CM | POA: Diagnosis not present

## 2014-03-28 DIAGNOSIS — R278 Other lack of coordination: Secondary | ICD-10-CM

## 2014-03-28 DIAGNOSIS — R531 Weakness: Secondary | ICD-10-CM

## 2014-03-28 DIAGNOSIS — R279 Unspecified lack of coordination: Secondary | ICD-10-CM

## 2014-03-28 DIAGNOSIS — Z7409 Other reduced mobility: Secondary | ICD-10-CM

## 2014-03-28 DIAGNOSIS — R269 Unspecified abnormalities of gait and mobility: Secondary | ICD-10-CM

## 2014-03-28 DIAGNOSIS — R296 Repeated falls: Secondary | ICD-10-CM

## 2014-03-28 NOTE — Patient Instructions (Addendum)
  1. Grip Strengthening (Resistive Putty)   Squeeze putty using thumb and all fingers. Repeat _10-15___ times. Do __1-2_ sessions per day.   2. Roll putty into tube on table and pinch between each finger and thumb x 10 reps each. (can do ring and small finger together)     Copyright  VHI. All rights reserved.  Reviewed ADL's and A/E use today in clinic. Pt appears to benefit from [facility] as well as button hook for increased independence with ADL/dressing. Pt was instructed in HEP for gentle grip/coordination activity w/ yellow putty x10 reps each for grip and pinch bilateral hands. Pt was also instructed in home program for WB/proximal stabilization ex's bilateral UE's while seated edge of mat, chair push ups or sitting on edge of bed/couch at home to assist with increased proximal control. Pt performed al in clinic today and returned demonstration in clinic.

## 2014-03-28 NOTE — Therapy (Signed)
[facility] [address] [place], [place], [address] Phone: [phone]   Fax:  [phone]  Occupational Therapy Treatment  Patient Details  Name: Dana Duke MRN: [id number] Date of Birth: 1971/01/28  Encounter Date: 03/28/2014      OT End of Session - 03/28/14 1209    Visit Number 5   Number of Visits 16   Date for OT Re-Evaluation 05/02/14   OT Start Time 1105   OT Stop Time 1145   OT Time Calculation (min) 40 min   Activity Tolerance Patient tolerated treatment well  First session for OT, PT to follow today.   Behavior During Therapy [facility] for tasks assessed/performed      Past Medical History  Diagnosis Date  . Migraines   . Ankle fracture, right   . Knee joint dislocation     bilateral  . History of hysterectomy     Past Surgical History  Procedure Laterality Date  . Abdominal hysterectomy    . Myomectomy    . Cesarean section      There were no vitals taken for this visit.  Visit Diagnosis:  Generalized weakness  Decreased coordination  Impaired functional mobility, balance, and endurance      Subjective Assessment - 03/28/14 1108    Symptoms "I fell last Thursday" at home, first fall in about last 2 months.    Pertinent History see epic snapshot   Repetition Increases Symptoms   Patient Stated Goals Patient eager for increased independence with lower body dressing   Currently in Pain? Yes   Pain Score 7    Pain Location Hip   Pain Orientation Left   Pain Descriptors / Indicators Burning;Constant   Pain Type Chronic pain   Pain Radiating Towards Hip to knees   Pain Onset More than a month ago   Pain Frequency Constant   Aggravating Factors  Weight bearing   Pain Relieving Factors None   Effect of Pain on Daily Activities Limits walking, standing and mobility   Multiple Pain Sites Yes            OT Treatments/Exercises (OP) - 03/28/14 0001    ADLs   Overall ADLs Discussed use of LH  A/E for LB ADL's today  "It's better" Re: LB ADL's but fatigue and coord limit   LB Dressing Pt reports that use of LH shoehorn and button hook were beneficial and increased independence with ADL's  Pt reports overall increased Independence with A/E at home   Writing Pt performed handwriting tasks w/ and w/o built up handles. All writing was legible and she felt as though she would not benefit from increased grip surface area for writing.  Pt daughter states that writing is slow & not changed/baseli   Exercises   Exercises Shoulder  Seated on mat, WB/proximal stabilization ex's B UE's   Shoulder Exercises: ROM/Strengthening   Other ROM/Strengthening Exercises Chair push ups/WB ex's for proximal stabilization bilateral UE's   Other ROM/Strengthening Exercises Gentle grip and pinch strengthening w/ yellow putty (performed in clinic x10 reps each) handout issued as well  Yellow putty for home use 1-2 x/ day per tolerance   Fine Motor Coordination   Fine Motor Coordination Small Pegboard  Copying pattern focus on prox stability and coord R & L   Small Pegboard Pt with increased difficulty copying pattern w/ Left vs right,     Reviewed ADL's and A/E use today in clinic. Pt appears to benefit from [facility] A/E as well  as button hook for increased independence with ADL/dressing.  Pt was instructed in HEP for gentle grip/coordination activity w/ yellow putty x10 reps each for grip and pinch bilateral hands.  Pt was also instructed in home program for WB/proximal stabilization ex's bilateral UE's while seated edge of mat, chair push ups or sitting on edge of bed/couch at home to assist with increased proximal control. Pt performed al in clinic today and returned demonstration in clinic.       OT Education - 03/28/14 1204    Education provided Yes   Education Details Reviewed ADL's, A/E and WB through UE's/proximal stabilization ex's, putty for grip/pinch   Person(s) Educated Patient;Child(ren)    Methods Explanation;Demonstration;Verbal cues;Handout   Comprehension Verbalized understanding;Returned demonstration          OT Short Term Goals - 03/28/14 1214    OT SHORT TERM GOAL #1   Title Pt will be I home program for coordination bilateral UE's - 04/04/2014   Baseline 04/04/14   Time 4   Period Weeks   Status On-going   OT SHORT TERM GOAL #2   Title Pt will I'ly state 2-3 energy conservation techniques that she can implement during ADL's   Baseline 04/04/14   Time 4   Period Weeks   Status On-going   OT SHORT TERM GOAL #3   Title Pt will be Min A LB dressing using a/e PRN    Baseline 04/04/14   Time 4   Period Weeks   Status On-going          OT Long Term Goals - 03/28/14 1216    OT LONG TERM GOAL #1   Title Pt will be Mod I upated HEP - 05/02/2014   Baseline 05/02/14   Time 8   Period Weeks   Status On-going   OT LONG TERM GOAL #2   Title Pt will be Mod I LB dressing using a/e PRN seated and with sit to stand.   Baseline 05/02/14   Time 8   Period Weeks   Status On-going   OT LONG TERM GOAL #3   Title Pt will demonstrate Mod I basic ADL's and homemaking tasks using a/e PRN   Baseline 05/02/14   Time 8   Period Weeks   Status On-going   OT LONG TERM GOAL #4   Title Pt will be Mod I simple snack/meal prep while implementing energy conservation techniques   Baseline 05/02/14   Time 8   Period Weeks   Status On-going          Plan - 03/28/14 1215    Plan Review WB & proximal stabilization ex's bilateral UE's, address functional balance, endurance, begin assessing STG's.                               Problem List Patient Active Problem List   Diagnosis Date Noted  . Headache 10/28/2013  . Multiple sclerosis 08/23/2013  . Gait difficulty 02/22/2013    Alm BustardBarnhill, Prabhjot Piscitello Beth Dixon, OT 03/28/2014, 12:18 PM

## 2014-03-28 NOTE — Telephone Encounter (Signed)
Rx is done, Annabelle Harman, please fax over.

## 2014-03-28 NOTE — Therapy (Signed)
[facility] [address] [place], [place], [address] Phone: [phone]   Fax:  [phone]  Physical Therapy Treatment  Patient Details  Name: Dana Duke MRN: [id number] Date of Birth: [birth year]  Encounter Date: 03/28/2014      PT End of Session - 03/28/14 1256    Visit Number 8   Number of Visits 16   Date for PT Re-Evaluation 04/29/14   PT Start Time 1145   PT Stop Time 1226   PT Time Calculation (min) 41 min   Equipment Utilized During Treatment Gait belt   Activity Tolerance Patient tolerated treatment well   Behavior During Therapy [facility] for tasks assessed/performed      Past Medical History  Diagnosis Date  . Migraines   . Ankle fracture, right   . Knee joint dislocation     bilateral  . History of hysterectomy     Past Surgical History  Procedure Laterality Date  . Abdominal hysterectomy    . Myomectomy    . Cesarean section      There were no vitals taken for this visit.  Visit Diagnosis:  Gait difficulty  Abnormality of gait  Weakness generalized  Falls frequently      Subjective Assessment - 03/28/14 1148    Symptoms Pt reported she fell last Thursday (03/24/14), while ambulating. Pt denied hitting head and stated "I was able to get up right after the fall".    Currently in Pain? Yes   Pain Score 7    Pain Location Hip  hip/knee   Pain Orientation Left   Pain Descriptors / Indicators Burning   Pain Type Chronic pain   Pain Onset More than a month ago   Pain Frequency Constant   Aggravating Factors  weight bearing   Pain Relieving Factors none            [facility] Adult PT Treatment/Exercise - 03/28/14 1155    Ambulation/Gait   Ambulation/Gait Yes   Ambulation/Gait Assistance 4: Min guard   Ambulation/Gait Assistance Details Thayer Ohm, orthotist from [facility] present during session to trial AFOs. Pt ambulated over even terrain with B blue rocker AFOs and then L blue rocker (with 1/2"  heel wedge) and R [place]ock AFOs with rollator. VC's to decr. L genu recurvatum after ambulating >125'.  Pt required seated rest breaks after each bout of ambulation due to fatigue. Pt noted to ambulate with improve foot clearance with L blue rocker AFO (and 1/2" heel wedge) and R posterior [place]ock AFO.    Ambulation Distance (Feet) --  35' and 230' with AFOs, 300' without AFOs   Assistive device Rollator   Gait Pattern Step-through pattern;Decreased stride length;Ataxic;Poor foot clearance - left;Poor foot clearance - right;Lateral hip instability;Decreased trunk rotation;Narrow base of support;Left genu recurvatum   Gait velocity decreased          PT Education - 03/28/14 1256    Education provided Yes   Education Details Reiterated the importance of energy conservation technique, in order to reduce risk for falls and fatigue.   Person(s) Educated Patient;Child(ren)  pt's daughter.   Methods Explanation   Comprehension Verbalized understanding          PT Short Term Goals - 03/28/14 1259    PT SHORT TERM GOAL #1   Title independent with HEP (03/28/14)   Status On-going   PT SHORT TERM GOAL #2   Title improve BERG balance score to > 30/56 for improved balance (03/28/14)   Status On-going  PT SHORT TERM GOAL #3   Title ambulate > 250' with LRAD modified independent for improved functional mobility (03/28/14)   Status On-going   PT SHORT TERM GOAL #4   Title assess for need for lower extremity orthotic (R, L, bil) and determine appropriate device if indicated (03/28/14)   Baseline 03/28/14.   Status Achieved          PT Long Term Goals - 03/28/14 1259    PT LONG TERM GOAL #1   Title verbalize understanding of fall prevention strategies and energy conservation (04/25/14)   Status On-going   PT LONG TERM GOAL #2   Title improve BERG balance score to > 36/56 for decreased fall risk and improved function (04/25/14)   Status On-going   PT LONG TERM GOAL #3   Title improve  timed up and go to < 13 sec for improved mobility (04/25/14)   Status On-going   PT LONG TERM GOAL #4   Title ambulate > 500' on paved outdoor/various indoor surfaces with LRAD modified independent for improved functional mobility. (04/25/14)   Status On-going          Plan - 03/28/14 1257    Clinical Impression Statement Pt demonstrated progress as she experienced improved foot clearance with L blue rocker AFO and R post. Ottobock AFO donned. Pt continues to experience L genu recurvatum during mid stance while ambulating longer distances, even with 1/2" heel wedge and blue rocker donned. Therefore, pt would benfit from  L blue rocker with knee component (KAFO). PT sent note to MD requesting prescription for AFO, KAFO, and rollator. Pt would continue to benefit from skilled PT to improve safety during funcitonal mboilty.   Pt will benefit from skilled therapeutic intervention in order to improve on the following deficits Abnormal gait;Decreased activity tolerance;Decreased balance;Decreased knowledge of use of DME;Decreased strength;Impaired perceived functional ability;Pain;Decreased knowledge of precautions;Decreased endurance;Difficulty walking   Rehab Potential Good   PT Frequency 2x / week   PT Duration 8 weeks   PT Treatment/Interventions ADLs/Self Care Home Management;Gait training;Neuromuscular re-education;Functional mobility training;Patient/family education;Therapeutic activities;Electrical Stimulation;Therapeutic exercise;Manual techniques;Energy conservation;Balance training;Moist Heat;Cryotherapy   PT Next Visit Plan Assess STGs.   Consulted and Agree with Plan of Care Patient                               Problem List Patient Active Problem List   Diagnosis Date Noted  . Headache 10/28/2013  . Multiple sclerosis 08/23/2013  . Gait difficulty 02/22/2013    Miller,Jennifer L 03/28/2014, 1:00 PM     Zerita BoersJennifer Miller, PT,DPT 03/28/2014 1:00  PM Phone: [phone] Fax: [phone]

## 2014-03-28 NOTE — Telephone Encounter (Signed)
Hello Dr. Terrace ArabiaYan~  I am currently seeing your patient, Dana Duke, for MS (difficulty with walking). We set up an appointment with an orthotist to trial several AFOs and rollator. In order to improve B foot clearance and decrease L genu recurvatum, I feel that Dana Duke would benefit from a L KAFO (blue rocker AFO with knee control component), and a R posterior leaf spring AFO. She would also benefit from a rollator to improve safety during ambulation and to conserve energy. Please sent us a prescription for the L KAFO, R AFO, and rollator if you agree.  Thank you,  Dana Duke, PT, DPT

## 2014-03-29 NOTE — Telephone Encounter (Signed)
I will walk RX over to [facility].

## 2014-03-30 ENCOUNTER — Ambulatory Visit: Payer: 59 | Admitting: Occupational Therapy

## 2014-03-30 ENCOUNTER — Encounter: Payer: Self-pay | Admitting: Occupational Therapy

## 2014-03-30 ENCOUNTER — Ambulatory Visit: Payer: 59

## 2014-03-30 DIAGNOSIS — R296 Repeated falls: Secondary | ICD-10-CM

## 2014-03-30 DIAGNOSIS — R269 Unspecified abnormalities of gait and mobility: Secondary | ICD-10-CM

## 2014-03-30 DIAGNOSIS — R531 Weakness: Secondary | ICD-10-CM

## 2014-03-30 DIAGNOSIS — Z7409 Other reduced mobility: Secondary | ICD-10-CM

## 2014-03-30 DIAGNOSIS — R278 Other lack of coordination: Secondary | ICD-10-CM

## 2014-03-30 DIAGNOSIS — Z5189 Encounter for other specified aftercare: Secondary | ICD-10-CM | POA: Diagnosis not present

## 2014-03-30 DIAGNOSIS — G35 Multiple sclerosis: Secondary | ICD-10-CM

## 2014-03-30 DIAGNOSIS — R279 Unspecified lack of coordination: Secondary | ICD-10-CM

## 2014-03-30 NOTE — Patient Instructions (Addendum)
  Coordination Activities  Perform the following activities for 15 minutes 1-2 times per day with both hand(s).    Rotate ball in fingertips (clockwise and counter-clockwise). Toss ball in air and catch with the same hand. Flip cards 1 at a time as fast as you can. Deal cards with your thumb (Hold deck in hand and push card off top with thumb). Rotate card in hand (clockwise and counter-clockwise). Pick up coins one at a time until you get 5-10 in your hand, then move coins from palm to fingertips to stack one at a time. Practice writing and/or typing.

## 2014-03-30 NOTE — Therapy (Addendum)
[facility] [address] [place], [place], [address] Phone: [phone]   Fax:  [phone]  Physical Therapy Treatment  Patient Details  Name: Dana Duke MRN: [id number] Date of Birth: October 09, 1970  Encounter Date: 03/30/2014      PT End of Session - 03/30/14 1030    Visit Number 9   Number of Visits 16   Date for PT Re-Evaluation 04/29/14   PT Start Time 0932   PT Stop Time 1014   PT Time Calculation (min) 42 min   Equipment Utilized During Treatment Gait belt   Activity Tolerance Patient tolerated treatment well   Behavior During Therapy [facility] for tasks assessed/performed      Past Medical History  Diagnosis Date  . Migraines   . Ankle fracture, right   . Knee joint dislocation     bilateral  . History of hysterectomy     Past Surgical History  Procedure Laterality Date  . Abdominal hysterectomy    . Myomectomy    . Cesarean section      There were no vitals taken for this visit.  Visit Diagnosis:  Gait difficulty  Abnormality of gait  Weakness generalized  Falls frequently      Subjective Assessment - 03/30/14 0935    Symptoms Pt reported she feels about the same and denied falling since last visit.    Limitations Standing;Walking;House hold activities   Patient Stated Goals improve mobility; walk, standing and peform household chores   Currently in Pain? Yes   Pain Score 7    Pain Location Knee   Pain Orientation Left   Pain Descriptors / Indicators Aching   Pain Type Chronic pain   Pain Radiating Towards Hip to knees   Pain Onset More than a month ago   Pain Frequency Constant   Aggravating Factors  weight bearing   Pain Relieving Factors none   Effect of Pain on Daily Activities limits walking, standing and mobility.            [facility] Adult PT Treatment/Exercise - 03/30/14 0936    Ambulation/Gait   Ambulation/Gait Yes   Ambulation/Gait Assistance 6: Modified independent  (Device/Increase time)   Ambulation/Gait Assistance Details Pt ambulated over even terrain without AFOs donned, while performing turns and head turns. No LOB episodes, pt demonstrated improved heel strike, stride length and posture. Pt required seated rest break after ambulation due to fatigue.   Ambulation Distance (Feet) 350 Feet   Assistive device Rollator   Gait Pattern Decreased dorsiflexion - right;Decreased dorsiflexion - left;Lateral hip instability;Left genu recurvatum   Balance   Balance Assessed Yes   Static Standing Balance   Static Standing - Balance Support No upper extremity supported   Static Standing - Level of Assistance 5: Stand by assistance   Static Standing - Comment/# of Minutes B LE tandem stance 2x30 second holds/LE. VC's to improve upright posture and technique.   Standardized Balance Assessment   Standardized Balance Assessment Berg Balance Test   Berg Balance Test   Sit to Stand Able to stand without using hands and stabilize independently   Standing Unsupported Able to stand safely 2 minutes   Sitting with Back Unsupported but Feet Supported on Floor or Stool Able to sit safely and securely 2 minutes   Stand to Sit Sits safely with minimal use of hands   Transfers Able to transfer safely, minor use of hands   Standing Unsupported with Eyes Closed Able to stand 10 seconds safely  Standing Ubsupported with Feet Together Able to place feet together independently and stand for 1 minute with supervision   From Standing, Reach Forward with Outstretched Arm Can reach confidently >25 cm (10")  11"   From Standing Position, Pick up Object from [place] to pick up shoe, needs supervision   From Standing Position, Turn to Look Behind Over each Shoulder Looks behind one side only/other side shows less weight shift   Turn 360 Degrees Able to turn 360 degrees safely but slowly   Standing Unsupported, Alternately Place Feet on Step/Stool Able to complete 4 steps without aid  or supervision   Standing Unsupported, One Foot in Front Able to plae foot ahead of the other independently and hold 30 seconds  pt able to hold tandem stance for 20 seconds   Standing on One Leg Tries to lift leg/unable to hold 3 seconds but remains standing independently  pt required frequent seated rest breaks 2/2 fatigue.   Total Score 45          PT Education - 03/30/14 1029    Education provided Yes   Education Details Reviewed HEP and progressed balance HEP. PT gave pt the MD prescription for rollator and educated pt on purchasing rollator. PT tech gave pt information from our DME catalog if pt wishes to purchase rollator from our catalog.   Person(s) Educated Patient   Methods Explanation;Demonstration;Handout   Comprehension Verbalized understanding;Returned demonstration          PT Short Term Goals - 03/30/14 1034    PT SHORT TERM GOAL #1   Title independent with HEP (03/28/14)   Status Achieved   PT SHORT TERM GOAL #2   Title improve BERG balance score to > 30/56 for improved balance (03/28/14)   Baseline 45/56 on 03/30/14.   Status Achieved   PT SHORT TERM GOAL #3   Title ambulate > 250' with LRAD modified independent for improved functional mobility (03/28/14)   Status Achieved   PT SHORT TERM GOAL #4   Title assess for need for lower extremity orthotic (R, L, bil) and determine appropriate device if indicated (03/28/14)   Baseline 03/28/14.   Status Achieved          PT Long Term Goals - 03/30/14 1035    PT LONG TERM GOAL #1   Title verbalize understanding of fall prevention strategies and energy conservation (04/25/14)   Status On-going   PT LONG TERM GOAL #2   Title improve BERG balance score to >/= 49/56 for decreased fall risk and improved function (04/25/14)   Baseline Revised on 03/31/15 from >36 to >/=49, as pt scored 45/56 on 03/31/15.   Status Revised   PT LONG TERM GOAL #3   Title improve timed up and go to < 13 sec for improved mobility  (04/25/14)   Status On-going   PT LONG TERM GOAL #4   Title ambulate > 500' on paved outdoor/various indoor surfaces with LRAD modified independent for improved functional mobility. (04/25/14)   Status On-going          Plan - 03/30/14 1030    Clinical Impression Statement Pt demonstrated progress as she met 4/4 STGs and BERG LTG (#2). Pt is showing great progress; PT and pt discussed reducing frequency from 2x/week to 1x/week for the last 4 weeks. Pt agreeable. Pt would continue to benefit from skilled therapy to improve safety during functional mobility.   Pt will benefit from skilled therapeutic intervention in order to improve on the  following deficits Abnormal gait;Decreased activity tolerance;Decreased balance;Decreased knowledge of use of DME;Decreased strength;Impaired perceived functional ability;Pain;Decreased knowledge of precautions;Decreased endurance;Difficulty walking   Rehab Potential Good   PT Frequency Other (comment)  reduced to 1x/week for the last 4 weeks.   PT Duration Other (comment)  reduced to 1x/week for the last 4 weeks.   PT Treatment/Interventions ADLs/Self Care Home Management;Gait training;Neuromuscular re-education;Functional mobility training;Patient/family education;Therapeutic activities;Electrical Stimulation;Therapeutic exercise;Manual techniques;Energy conservation;Balance training;Moist Heat;Cryotherapy                               Problem List Patient Active Problem List   Diagnosis Date Noted  . Headache 10/28/2013  . Multiple sclerosis 08/23/2013  . Gait difficulty 02/22/2013    Pedram Goodchild L 03/30/2014, 10:42 AM     Geoffry Paradise, PT,DPT 03/30/2014 10:42 AM Phone: [phone] Fax: [phone]

## 2014-03-30 NOTE — Therapy (Signed)
[facility] [address] [place], [place], [address] Phone: [phone]   Fax:  [phone]  Occupational Therapy Treatment  Patient Details  Name: Dana HaggardDesrene Schwinn MRN: [id number] Date of Birth: 01/08/1971  Encounter Date: 03/30/2014      OT End of Session - 03/30/14 0943    Visit Number 6   Number of Visits 16   Date for OT Re-Evaluation 05/02/14   OT Start Time 0850   OT Stop Time 0930   OT Time Calculation (min) 40 min   Activity Tolerance Patient limited by fatigue      Past Medical History  Diagnosis Date  . Migraines   . Ankle fracture, right   . Knee joint dislocation     bilateral  . History of hysterectomy     Past Surgical History  Procedure Laterality Date  . Abdominal hysterectomy    . Myomectomy    . Cesarean section      There were no vitals taken for this visit.  Visit Diagnosis:  Generalized weakness  Decreased coordination  Impaired functional mobility, balance, and endurance  MS (multiple sclerosis)      Subjective Assessment - 03/30/14 0854    Symptoms "My endurance is poor and I don't remember things well"   Pertinent History see epic snapshot   Repetition Increases Symptoms   Currently in Pain? Yes   Pain Score 7    Pain Location Knee   Pain Orientation Left   Pain Descriptors / Indicators --  O.T. not addressing            OT Treatments/Exercises (OP) - 03/30/14 0940    ADLs   ADL Comments Reviewed A/E needs for dressing. Pt provided handouts for button hook and LH shoe horn to increase ease/efficiency w/ dressing. Pt also issued energy conservation techniques handout and reviewed. Daughter also present for all education. Pt/daugther had further questions re: advanced directives and therapist briefly reviewed w/ them   Fine Motor Coordination   Fine Motor Coordination In hand manipuation training   In Hand Manipulation Training Pt issued coordination activities (HEP) and pt  return demo of each.           OT Education - 03/30/14 [phone]    Education provided Yes   Education Details coordination HEP, Energy conservation techniques   Person(s) Educated Patient;Child(ren)   Methods Explanation;Demonstration;Handout   Comprehension Verbalized understanding;Returned demonstration          OT Short Term Goals - 03/30/14 0944    OT SHORT TERM GOAL #1   Title Pt will be I home program for coordination bilateral UE's - 04/04/2014   Baseline 04/04/14   Time 4   Period Weeks   Status On-going   OT SHORT TERM GOAL #2   Title Pt will I'ly state 2-3 energy conservation techniques that she can implement during ADL's   Baseline 04/04/14   Time 4   Period Weeks   Status On-going   OT SHORT TERM GOAL #3   Title Pt will be Min A LB dressing using a/e PRN    Baseline 04/04/14   Time 4   Period Weeks   Status On-going          OT Long Term Goals - 03/30/14 0944    OT LONG TERM GOAL #1   Title Pt will be Mod I upated HEP - 05/02/2014   Baseline 05/02/14   Time 8   Period Weeks   Status On-going   OT  LONG TERM GOAL #2   Title Pt will be Mod I LB dressing using a/e PRN seated and with sit to stand.   Baseline 05/02/14   Time 8   Period Weeks   Status On-going   OT LONG TERM GOAL #3   Title Pt will demonstrate Mod I basic ADL's and homemaking tasks using a/e PRN   Baseline 05/02/14   Time 8   Period Weeks   Status On-going   OT LONG TERM GOAL #4   Title Pt will be Mod I simple snack/meal prep while implementing energy conservation techniques   Baseline 05/02/14   Time 8   Period Weeks   Status On-going          Plan - 03/30/14 0944    Clinical Impression Statement Pt is progressing towards all STG's.    Plan Review WB & proximal stabilization ex's, review coordination HEP, *assess STG's!!                               Problem List Patient Active Problem List   Diagnosis Date Noted  . Headache 10/28/2013  . Multiple  sclerosis 08/23/2013  . Gait difficulty 02/22/2013    Kelli Churn 03/30/2014, 9:47 AM  Jene Every, OTR/L 03/30/2014 9:49 AM Phone [phone] FAX [phone]

## 2014-03-30 NOTE — Patient Instructions (Signed)
Perform in the corner with a chair in front of you for safety. Stop the modified heel-toe exercise and perform the exercise listed below.  Feet Heel-Toe "Tandem", Varied Arm Positions - Eyes Open   With eyes open, right foot directly in front of the other, arms at your side, look straight ahead at a stationary object. Hold _30___ seconds. Repeat with left leg in front of the other. Repeat __3__ times per session. Do __1__ sessions per day.  Copyright  VHI. All rights reserved.

## 2014-04-04 ENCOUNTER — Ambulatory Visit (INDEPENDENT_AMBULATORY_CARE_PROVIDER_SITE_OTHER): Payer: Self-pay | Admitting: Neurology

## 2014-04-04 ENCOUNTER — Ambulatory Visit: Payer: 59 | Admitting: Occupational Therapy

## 2014-04-04 ENCOUNTER — Encounter: Payer: Self-pay | Admitting: Occupational Therapy

## 2014-04-04 ENCOUNTER — Ambulatory Visit: Payer: 59 | Admitting: Neurology

## 2014-04-04 DIAGNOSIS — Z5189 Encounter for other specified aftercare: Secondary | ICD-10-CM | POA: Diagnosis not present

## 2014-04-04 DIAGNOSIS — G35 Multiple sclerosis: Secondary | ICD-10-CM

## 2014-04-04 DIAGNOSIS — R531 Weakness: Secondary | ICD-10-CM

## 2014-04-04 DIAGNOSIS — R278 Other lack of coordination: Secondary | ICD-10-CM

## 2014-04-04 DIAGNOSIS — R279 Unspecified lack of coordination: Secondary | ICD-10-CM

## 2014-04-04 DIAGNOSIS — Z7409 Other reduced mobility: Secondary | ICD-10-CM

## 2014-04-04 NOTE — Progress Notes (Signed)
[facility]  PATIENT: Dana Duke DOB: 1970-06-05  HISTORICAL (initial visit was in Nov 2014) Dana Duke is a 43 years old right-handed African American female, referred by Dr. Suann Larry, Janalyn Rouse for evaluation of gait difficulties,  Since 2011 she noticed gradual onset still worsening gait difficulty, she tends to drag her foot across the floor, stiff, has difficulty going upstairs, because of knee pain, she suffered a history of right ankle fracture in 2010, seems to dragging her right foot across the floor more,  She has bilateral knee pain, hip pain, but she denies significant low back pain, no neck pain, she has urinary urgency, no bowel incontinence   MRI at [facility] in May 7th 2015, without contrast  Abnormal MRI cervical spine (without) demonstrating at least 4 spinal cord T2 hyperintensities at cervico-medullary junction, C2, C3-4 and C6. In combination with MRI brain findings, these most likely represent demyelinating plaques  MRI brain (without) demonstrating multiple (> 20) round and ovoid, periventricular, callosal and peri-callosal, subcortical, juxtacortical, pontine and cerebellar T2 hyperintensities. Some of these are perpendicular to the lateral ventricles on axial and sagittal views. Some of these are hypointense on T1 views. Findings suspicious for demyelinating disease  The findings, and progressive worsening neurological complaints, are most consistent with multiple sclerosis, Most likely relapsing remitting multiple sclerosis   CSF showed 5 oligoclonal banding, elevated IgG index   Extensive laboratory evaluations showed normal or negative CBC, CMP, Lyme titer, ANA, NMO, TSH, B12, folic acid, RPR. Positive varicellar zoster antibody  The visual evoked response test above was within normal limits on the left, with prolongation of the P100 latency on the right.  JC virus antibody was 0.15 negative in 10/18/2013  UPDATE July 16th 2015:  MRI of  brain, and cervical spine showed no contrast enhancement, there was no lesions noticed in MRI thoracic spine,  We have reviewed treatment options along with patient and her husband, pros and cons were discussed, we decided to proceed with Plegridy.  She also complains of few years history of constant bilateral temporal, frontal area headaches, pressure, there was no significant  light, noise sensitivity, 7 out of 10 on daily basis, she denies nauseous  UPDATE Oct 20th 2015:  She started Plegridy since July 2015,had  total of 6 injections, most recent one was Jan 24 2014, began full dose injection since Sept 15 2015.   She has fallen few times, more difficulty walking, when she fell few weeks ago, Sep 15th 2015, she has difficulty getting up, could not move from waist down for 2-3 hours, also noticed slurring of her speech. She has mild fever then  She called Dr. Anne Hahn, had a repeat MRI of the brain, we have reviewed MRI together, continued evidence of periventricular white matter disease consistent with her diagnosis of multiple sclerosis, there was no change compared to previous image in  may 2015  She was started on prednisone by mouth tapering since October 16, tolerating the medication well, seems to have mild improvement in her gait difficulty  She also complainsof injection site injection reactions, lasting 2-4 weeks, she only has mild flu like illness   REVIEW OF SYSTEMS: Full 14 system review of systems performed and notable only for blurry vision, gait difficulties  ALLERGIES: Allergies  Allergen Reactions  . Sulfa Antibiotics Nausea And Vomiting    HOME MEDICATIONS: Outpatient Prescriptions Prior to Visit  Medication Sig Dispense Refill  . baclofen (ED BACLOFEN) 10 MG tablet Take 1 tablet (10 mg total) by  mouth 3 (three) times daily. 90 each 11  . nortriptyline (PAMELOR) 10 MG capsule One po qhs xone week, then 2 tabs po qhs 60 capsule 11  . rizatriptan (MAXALT-MLT) 5 MG  disintegrating tablet Take 1 tablet (5 mg total) by mouth as needed for migraine. May repeat in 2 hours if needed 15 tablet 11     PAST MEDICAL HISTORY: Past Medical History  Diagnosis Date  . Migraines   . Ankle fracture, right   . Knee joint dislocation     bilateral  . History of hysterectomy     PAST SURGICAL HISTORY: Past Surgical History  Procedure Laterality Date  . Abdominal hysterectomy    . Myomectomy    . Cesarean section      FAMILY HISTORY: Family History  Problem Relation Age of Onset  . Diabetes Mother   . Osteoarthritis    . Heart disease    . Rheumatic fever    . Hypertension      SOCIAL HISTORY:  History   Social History  . Marital Status: Married    Spouse Name: [name]    Number of Children: 1  . Years of Education: college   Occupational History  .      Does not work   Social History Main Topics  . Smoking status: Never Smoker   . Smokeless tobacco: Never Used  . Alcohol Use: 0.6 oz/week    1 Glasses of wine per week     Comment: On Holidays  . Drug Use: No  . Sexual Activity: Yes    Birth Control/ Protection: Surgical   Other Topics Concern  . Not on file   Social History Narrative   Patient is married. [name]).    Patient is unemployed.   Education- College education   Right handed.   Caffeine- None     PHYSICAL EXAM   There were no vitals filed for this visit. There is no weight on file to calculate BMI.   Generalized: In no acute distress  Neck: Supple, no carotid bruits   Cardiac: Regular rate rhythm  Pulmonary: Clear to auscultation bilaterally  Musculoskeletal: No deformity  Neurological examination  Mentation: Alert oriented to time, place, history taking, and causual conversation  Cranial nerve II-XII: Pupils were equal round reactive to light extraocular movements were full, visual field were full on confrontational test. facial sensation and strength were normal. hearing was intact to finger rubbing  bilaterally. Uvula tongue midline.  head turning and shoulder shrug and were normal and symmetric.Tongue protrusion into cheek strength was normal.  Motor: Moderate bilateral lower extremity spasticity, left hip flexion 4 knee flexion 4 plus, .  Sensory: Intact to fine touch, pinprick, preserved vibratory sensation, and proprioception at toes.  Coordination: Normal finger to nose, heel-to-shin bilaterally there was no truncal ataxia  Gait: Rising up from seated position without assistance, normal stance, mild wide based stiff gait, cautious, mild difficulty with tiptoe, heel and tandem walking Romberg signs: Negative  Deep tendon reflexes: Brachioradialis 2/2, biceps 2/2, triceps 2/2, patellar 3/3, Achilles 2/2, plantar responses were flexor bilaterally.   DIAGNOSTIC DATA (LABS, IMAGING, TESTING) - I reviewed patient records, labs, notes, testing and imaging myself where available.  ASSESSMENT AND PLAN   43 years old right-handed [place] American female, with 3 years history of unsteady gait, hyperreflexia of bilateral lower extremity on examination,  marked abnormal MRI of the brain, and cervical spine, most consistent with relapsing remitting multiple sclerosis, also reported a history of constant  headaches. Consistent with migraine, which has much improved with nortriptyline 10 mg every night, and Maxalt as needed  She is here for [facility]4 screening visit.   Levert FeinsteinYijun Shari Natt, M.D. Ph.D.  [facility] [address], [address] [place], [place] [address] [phone]

## 2014-04-04 NOTE — Therapy (Signed)
[facility] [address] [place], [place], [address] Phone: [phone]   Fax:  [phone]  Occupational Therapy Treatment  Patient Details  Name: Dana Duke MRN: [id number] Date of Birth: 05/31/1970  Encounter Date: 04/04/2014      OT End of Session - 04/04/14 1021    Visit Number 7   Number of Visits 16   Date for OT Re-Evaluation 05/02/14   OT Start Time 0945   OT Stop Time 1015   OT Time Calculation (min) 30 min   Activity Tolerance Patient tolerated treatment well      Past Medical History  Diagnosis Date  . Migraines   . Ankle fracture, right   . Knee joint dislocation     bilateral  . History of hysterectomy     Past Surgical History  Procedure Laterality Date  . Abdominal hysterectomy    . Myomectomy    . Cesarean section      There were no vitals taken for this visit.  Visit Diagnosis:  Generalized weakness  Decreased coordination  Impaired functional mobility, balance, and endurance  MS (multiple sclerosis)      Subjective Assessment - 04/04/14 0945    Symptoms I'm just exhausted, my endurance is poor   Currently in Pain? Yes  O.T. not addressing secondary to outside scope of practice (knee and hip)                 OT Treatments/Exercises (OP) - 04/04/14 0953    Neurological Re-education Exercises   Other Exercises 1 Trunk control on physioball: marches for hip flexion and lateral trunk flexion activation w/ decreased balance, bilateral trunk rotation, A-P pelvic tilts and circumduction ex's w/o LOB.    Fine Motor Coordination   Small Pegboard Pt placing small pegs in pegboard vertical surface Rt/Lt UE's (alternating) at 100-120* shoulder flexion while seated on physioball for trunk control w/ min difficulty and drops. Pt required questioning cues/min cues to correct design.                   OT Short Term Goals - 04/04/14 1004    OT SHORT TERM GOAL #1    Title Pt will be I home program for coordination bilateral UE's - 04/04/2014   Status Achieved   OT SHORT TERM GOAL #2   Title Pt will I'ly state 2-3 energy conservation techniques that she can implement during ADL's   Status Achieved   OT SHORT TERM GOAL #3   Title Pt will be Min A LB dressing using a/e PRN    Status Achieved           OT Long Term Goals - 03/30/14 0944    OT LONG TERM GOAL #1   Title Pt will be Mod I upated HEP - 05/02/2014   Baseline 05/02/14   Time 8   Period Weeks   Status On-going   OT LONG TERM GOAL #2   Title Pt will be Mod I LB dressing using a/e PRN seated and with sit to stand.   Baseline 05/02/14   Time 8   Period Weeks   Status On-going   OT LONG TERM GOAL #3   Title Pt will demonstrate Mod I basic ADL's and homemaking tasks using a/e PRN   Baseline 05/02/14   Time 8   Period Weeks   Status On-going   OT LONG TERM GOAL #4   Title Pt will be Mod I simple snack/meal prep while   implementing energy conservation techniques   Baseline 05/02/14   Time 8   Period Weeks   Status On-going               Plan - 04/04/14 1021    Clinical Impression Statement Pt met all STG's. Pt progressing w/ ADLS and coordination.     Plan Review coordination HEP prn, continue coordination, core strengthening, and standing balance for IADLs        Problem List Patient Active Problem List   Diagnosis Date Noted  . Headache 10/28/2013  . Multiple sclerosis 08/23/2013  . Gait difficulty 02/22/2013    Carey Bullocks, OTR/L 04/04/2014, 10:24 AM  [facility] [address] [place], [place], [address] Phone: [phone]   Fax:  [phone]

## 2014-04-05 ENCOUNTER — Ambulatory Visit: Payer: 59 | Admitting: Occupational Therapy

## 2014-04-05 ENCOUNTER — Ambulatory Visit: Payer: 59

## 2014-04-05 DIAGNOSIS — G35 Multiple sclerosis: Secondary | ICD-10-CM

## 2014-04-05 DIAGNOSIS — R279 Unspecified lack of coordination: Secondary | ICD-10-CM

## 2014-04-05 DIAGNOSIS — R531 Weakness: Secondary | ICD-10-CM

## 2014-04-05 DIAGNOSIS — R278 Other lack of coordination: Secondary | ICD-10-CM

## 2014-04-05 DIAGNOSIS — R296 Repeated falls: Secondary | ICD-10-CM

## 2014-04-05 DIAGNOSIS — Z7409 Other reduced mobility: Secondary | ICD-10-CM

## 2014-04-05 DIAGNOSIS — R269 Unspecified abnormalities of gait and mobility: Secondary | ICD-10-CM

## 2014-04-05 DIAGNOSIS — Z5189 Encounter for other specified aftercare: Secondary | ICD-10-CM | POA: Diagnosis not present

## 2014-04-05 NOTE — Therapy (Signed)
[facility] [address] [place], [place], [address] Phone: [phone]   Fax:  [phone]  Occupational Therapy Treatment  Patient Details  Name: Dana Duke MRN: [id number] Date of Birth: 09/14/1970  Encounter Date: 04/05/2014      OT End of Session - 04/05/14 1007    Visit Number 8   Number of Visits 16   Date for OT Re-Evaluation 05/02/14   Authorization Type [employer]   OT Start Time 0930   OT Stop Time 1015   OT Time Calculation (min) 45 min   Activity Tolerance Patient tolerated treatment well      Past Medical History  Diagnosis Date  . Migraines   . Ankle fracture, right   . Knee joint dislocation     bilateral  . History of hysterectomy     Past Surgical History  Procedure Laterality Date  . Abdominal hysterectomy    . Myomectomy    . Cesarean section      There were no vitals taken for this visit.  Visit Diagnosis:  Generalized weakness  Decreased coordination  Impaired functional mobility, balance, and endurance  MS (multiple sclerosis)      Subjective Assessment - 04/05/14 0930    Symptoms "I'm feeling ok, everythings the same. My writing is good!"   Pertinent History see epic snapshot   Repetition Increases Symptoms   Currently in Pain? Yes  hip and knee - O.T. not addressing                 OT Treatments/Exercises (OP) - 04/05/14 0944    ADLs   Functional Mobility Pt ambulating w/ laundry basket full of clothes then placing from floor to table bilateral sides. Pt simulated making bed (placing sheet on mat) then refolded sheet w/o LOB.    ADL Comments Pt is getting rollator for community/longer distances   Fine Motor Coordination   Fine Motor Coordination --  Reviewed coordination HEP - Pt returned demo of each    Small Pegboard Pt placing small pegs in pegboard manipulating 5 pegs at a time to place in pegboard Rt and Lt hand w/ min difficulty and drops. Pt  required min cues to copy design correctly and to correct mistakes.                   OT Short Term Goals - 04/04/14 1004    OT SHORT TERM GOAL #1   Title Pt will be I home program for coordination bilateral UE's - 04/04/2014   Status Achieved   OT SHORT TERM GOAL #2   Title Pt will I'ly state 2-3 energy conservation techniques that she can implement during ADL's   Status Achieved   OT SHORT TERM GOAL #3   Title Pt will be Min A LB dressing using a/e PRN    Status Achieved           OT Long Term Goals - 03/30/14 0944    OT LONG TERM GOAL #1   Title Pt will be Mod I upated HEP - 05/02/2014   Baseline 05/02/14   Time 8   Period Weeks   Status On-going   OT LONG TERM GOAL #2   Title Pt will be Mod I LB dressing using a/e PRN seated and with sit to stand.   Baseline 05/02/14   Time 8   Period Weeks   Status On-going   OT LONG TERM GOAL #3   Title Pt will demonstrate Mod  I basic ADL's and homemaking tasks using a/e PRN   Baseline 05/02/14   Time 8   Period Weeks   Status On-going   OT LONG TERM GOAL #4   Title Pt will be Mod I simple snack/meal prep while implementing energy conservation techniques   Baseline 05/02/14   Time 8   Period Weeks   Status On-going               Plan - 04/05/14 1007    Clinical Impression Statement Pt progressing w/ IADLS per pt report.    OT Frequency 1x / week   Plan Reduce frequency to 1x/wk per pt request (for O.T. and P.T.), endurance training, UBE, core strengthening. Following session: memory compensatory strategies        Problem List Patient Active Problem List   Diagnosis Date Noted  . Headache 10/28/2013  . Multiple sclerosis 08/23/2013  . Gait difficulty 02/22/2013    Kelli Churn, OTR/L 04/05/2014, 10:18 AM  [facility] [address] [place], [place], [address] Phone: [phone]   Fax:  [phone]

## 2014-04-05 NOTE — Therapy (Signed)
[facility] [address] [place], [place], [address] Phone: [phone]   Fax:  [phone]  Physical Therapy Treatment  Patient Details  Name: Dana Duke MRN: [id number] Date of Birth: September 20, 1970  Encounter Date: 04/05/2014      PT End of Session - 04/05/14 1334    Visit Number 10   Number of Visits 16   Date for PT Re-Evaluation 04/29/14   PT Start Time 0846   PT Stop Time 0927   PT Time Calculation (min) 41 min   Equipment Utilized During Treatment Gait belt   Activity Tolerance Patient tolerated treatment well   Behavior During Therapy [facility] for tasks assessed/performed      Past Medical History  Diagnosis Date  . Migraines   . Ankle fracture, right   . Knee joint dislocation     bilateral  . History of hysterectomy     Past Surgical History  Procedure Laterality Date  . Abdominal hysterectomy    . Myomectomy    . Cesarean section      There were no vitals taken for this visit.  Visit Diagnosis:  Gait difficulty  Abnormality of gait  Weakness generalized  Falls frequently      Subjective Assessment - 04/05/14 0850    Symptoms Pt denied falls or changes since last visit.    Limitations Standing;Walking;House hold activities   Patient Stated Goals improve mobility; walk, standing and peform household chores   Currently in Pain? Yes   Pain Score --  4/10 R LE, 7/10 L LE   Pain Location Hip   Pain Orientation Right;Left   Pain Descriptors / Indicators Aching   Pain Type Chronic pain   Pain Radiating Towards knee   Pain Onset More than a month ago   Pain Frequency Constant                    [facility] Adult PT Treatment/Exercise - 04/05/14 0852    Ambulation/Gait   Ambulation/Gait No   Balance   Balance Assessed Yes   Dynamic Standing Balance   Dynamic Standing - Balance Support No upper extremity supported   Dynamic Standing - Level of Assistance 4: Min assist;Other  (comment)  min guard   Dynamic Standing - Balance Activities Lateral lean/weight shifting;Alternating  foot traps;Other (comment)   Dynamic Standing - Comments B LE toe taps on cone (single and double, 2x5 cones/LE), on compliant and non-compliant surface. Pt required min A during 5 LOB episodes. Pt also knocked over cones and righted cones with B LE, and picked up cones from non-compliant surface and walked cones over to compliant surfaces and placed cones on mat. VC's to improve weight shifting. Pt required seated rest breaks due to fatigue.     Therex: performed with min guard to min A (during 2 LOB episodes) -TrA activation performed while seated on exercise ball: 5x5 second holds. VC's for technique. -B hip marches with TrA activation x10/LE. VC's to improve eccentric control. -B LAQ's with TrA activation x10/LE. VC's to improve upright posture and eccentric control. -Reaching outside BOS with B UE 2x10/UE.             PT Short Term Goals - 03/30/14 1034    PT SHORT TERM GOAL #1   Title independent with HEP (03/28/14)   Status Achieved   PT SHORT TERM GOAL #2   Title improve BERG balance score to > 30/56 for improved balance (03/28/14)   Baseline 45/56 on  03/30/14.   Status Achieved   PT SHORT TERM GOAL #3   Title ambulate > 250' with LRAD modified independent for improved functional mobility (03/28/14)   Status Achieved   PT SHORT TERM GOAL #4   Title assess for need for lower extremity orthotic (R, L, bil) and determine appropriate device if indicated (03/28/14)   Baseline 03/28/14.   Status Achieved           PT Long Term Goals - 03/30/14 1035    PT LONG TERM GOAL #1   Title verbalize understanding of fall prevention strategies and energy conservation (04/25/14)   Status On-going   PT LONG TERM GOAL #2   Title improve BERG balance score to >/= 49/56 for decreased fall risk and improved function (04/25/14)   Baseline Revised on 03/31/15 from >36 to >/=49, as pt  scored 45/56 on 03/31/15.   Status Revised   PT LONG TERM GOAL #3   Title improve timed up and go to < 13 sec for improved mobility (04/25/14)   Status On-going   PT LONG TERM GOAL #4   Title ambulate > 500' on paved outdoor/various indoor surfaces with LRAD modified independent for improved functional mobility. (04/25/14)   Status On-going               Plan - 04/05/14 1335    Clinical Impression Statement Pt demonstrated progress as she was able to perform dynamic balance activities on compliant surfaces with less LOB. Continue with POC.   Pt will benefit from skilled therapeutic intervention in order to improve on the following deficits Abnormal gait;Decreased activity tolerance;Decreased balance;Decreased knowledge of use of DME;Decreased strength;Impaired perceived functional ability;Pain;Decreased knowledge of precautions;Decreased endurance;Difficulty walking   Rehab Potential Good   PT Frequency Other (comment)  reduced to 1x/week during last 4 weeks   PT Duration Other (comment)   PT Treatment/Interventions ADLs/Self Care Home Management;Gait training;Neuromuscular re-education;Functional mobility training;Patient/family education;Therapeutic activities;Electrical Stimulation;Therapeutic exercise;Manual techniques;Energy conservation;Balance training;Moist Heat;Cryotherapy   PT Next Visit Plan Dynamic gait over uneven terrain and dynamic balance on compliant surfaces.   Consulted and Agree with Plan of Care Patient        Problem List Patient Active Problem List   Diagnosis Date Noted  . Headache 10/28/2013  . Multiple sclerosis 08/23/2013  . Gait difficulty 02/22/2013    Miller,Jennifer L 04/05/2014, 1:38 PM  [facility] [address] [place], [place], [address] Phone: [phone]   Fax:  [phone]     Zerita BoersJennifer Miller, PT,DPT 04/05/2014 1:38 PM Phone: [phone] Fax: [phone]

## 2014-04-11 ENCOUNTER — Encounter: Payer: 59 | Admitting: Occupational Therapy

## 2014-04-12 ENCOUNTER — Telehealth: Payer: Self-pay | Admitting: Neurology

## 2014-04-12 NOTE — Telephone Encounter (Signed)
I spoke to the patient about her scheduled Visit 1 (Week 1) for the [facility] [id number] appointment. I asked the patient to come to the Research Department once the patient is through with her OT appointment.

## 2014-04-13 ENCOUNTER — Telehealth: Payer: Self-pay | Admitting: Neurology

## 2014-04-13 ENCOUNTER — Ambulatory Visit (INDEPENDENT_AMBULATORY_CARE_PROVIDER_SITE_OTHER): Payer: Self-pay | Admitting: Neurology

## 2014-04-13 ENCOUNTER — Ambulatory Visit: Payer: 59

## 2014-04-13 ENCOUNTER — Encounter: Payer: Self-pay | Admitting: Neurology

## 2014-04-13 ENCOUNTER — Ambulatory Visit: Payer: 59 | Admitting: Occupational Therapy

## 2014-04-13 VITALS — BP 117/81 | HR 76 | Temp 97.1°F | Resp 16

## 2014-04-13 DIAGNOSIS — R278 Other lack of coordination: Secondary | ICD-10-CM

## 2014-04-13 DIAGNOSIS — R269 Unspecified abnormalities of gait and mobility: Secondary | ICD-10-CM

## 2014-04-13 DIAGNOSIS — Z7409 Other reduced mobility: Secondary | ICD-10-CM

## 2014-04-13 DIAGNOSIS — R531 Weakness: Secondary | ICD-10-CM

## 2014-04-13 DIAGNOSIS — R279 Unspecified lack of coordination: Secondary | ICD-10-CM

## 2014-04-13 DIAGNOSIS — G35 Multiple sclerosis: Secondary | ICD-10-CM

## 2014-04-13 DIAGNOSIS — R296 Repeated falls: Secondary | ICD-10-CM

## 2014-04-13 DIAGNOSIS — Z5189 Encounter for other specified aftercare: Secondary | ICD-10-CM | POA: Diagnosis not present

## 2014-04-13 NOTE — Therapy (Signed)
[facility] [address] [place], [place], [address] Phone: [phone]   Fax:  [phone]  Occupational Therapy Treatment  Patient Details  Name: Denna HaggardDesrene Heritage MRN: [id number] Date of Birth: 06/12/1970  Encounter Date: 04/13/2014      OT End of Session - 04/13/14 1012    Visit Number 9   Number of Visits 16   Date for OT Re-Evaluation 05/02/14   Authorization Type UHC   Authorization Time Period week 5/8   OT Start Time 0935   OT Stop Time 1015   OT Time Calculation (min) 40 min   Activity Tolerance Patient tolerated treatment well      Past Medical History  Diagnosis Date  . Migraines   . Ankle fracture, right   . Knee joint dislocation     bilateral  . History of hysterectomy     Past Surgical History  Procedure Laterality Date  . Abdominal hysterectomy    . Myomectomy    . Cesarean section      There were no vitals taken for this visit.  Visit Diagnosis:  Generalized weakness  Decreased coordination  Impaired functional mobility, balance, and endurance  MS (multiple sclerosis)      Subjective Assessment - 04/13/14 0944    Symptoms "I can still do everything, it just takes me longer"   Pertinent History see epic snapshot   Repetition Increases Symptoms   Currently in Pain? Yes  O.T. not addressing secondary to outside scope of practice (Lt hip and Knee 7/10)                 OT Treatments/Exercises (OP) - 04/13/14 [id number]    ADLs   ADL Comments Discussed memory strategies and issued handout   Shoulder Exercises: ROM/Strengthening   Other ROM/Strengthening Exercises UBE x 10 min. Level 1 for strength and endurance.    Neurological Re-education Exercises   Other Exercises 1 Core strengthening on physioball: trunk rotation bilaterally, UB diagonals while maintaining balance on ball, lateral trunk flexion activation bilaterally w/ min to mod difficulty Lt side, pelvic  circumduction ex's and bilateral leg marches for lateral trunk flexion activation.                 OT Education - 04/13/14 [phone]    Education provided Yes   Education Details memory compensatory strategies   Person(s) Educated Patient   Methods Explanation;Handout   Comprehension Verbalized understanding          OT Short Term Goals - 04/04/14 1004    OT SHORT TERM GOAL #1   Title Pt will be I home program for coordination bilateral UE's - 04/04/2014   Status Achieved   OT SHORT TERM GOAL #2   Title Pt will I'ly state 2-3 energy conservation techniques that she can implement during ADL's   Status Achieved   OT SHORT TERM GOAL #3   Title Pt will be Min A LB dressing using a/e PRN    Status Achieved           OT Long Term Goals - 03/30/14 0944    OT LONG TERM GOAL #1   Title Pt will be Mod I upated HEP - 05/02/2014   Baseline 05/02/14   Time 8   Period Weeks   Status On-going   OT LONG TERM GOAL #2   Title Pt will be Mod I LB dressing using a/e PRN seated and with sit to stand.   Baseline 05/02/14  Time 8   Period Weeks   Status On-going   OT LONG TERM GOAL #3   Title Pt will demonstrate Mod I basic ADL's and homemaking tasks using a/e PRN   Baseline 05/02/14   Time 8   Period Weeks   Status On-going   OT LONG TERM GOAL #4   Title Pt will be Mod I simple snack/meal prep while implementing energy conservation techniques   Baseline 05/02/14   Time 8   Period Weeks   Status On-going               Plan - 04/13/14 1014    Clinical Impression Statement Pt progressing w/ trunk control and endurance   Rehab Potential Good   OT Frequency 1x / week  per pt request   Plan continue strength/endurance, coordination, Dynamic standing balance        Problem List Patient Active Problem List   Diagnosis Date Noted  . Headache 10/28/2013  . Multiple sclerosis 08/23/2013  . Gait difficulty 02/22/2013    Kelli Churn, OTR/L 04/13/2014, 10:19  AM  [facility] [address] [place], [place], [address] Phone: [phone]   Fax:  [phone]

## 2014-04-13 NOTE — Patient Instructions (Signed)

## 2014-04-13 NOTE — Progress Notes (Signed)
[facility]  PATIENT: Dana Duke DOB: 10/12/1970  HISTORICAL (initial visit was in Nov 2014) Ms. Dana Duke is a 43 years old right-handed African American female, referred by Dr. Suann Larryevesahwar, Janalyn RouseShaili, she has relapsing remitting multiple sclerosis  Since 2011 she noticed gradual onset slow worsening gait difficulty, she tends to drag her foot across the floor, stiff, has difficulty going upstairs, because of knee pain, she suffered a history of right ankle fracture in 2010, seems to dragging her right foot across the floor more, also complains urinary urgency, no bowel incontinence   MRI at [facility] in May 7th 2015, without contrast  MRI cervical spine (without) demonstrating at least 4 spinal cord T2 hyperintensities at cervico-medullary junction, C2, C3-4 and C6.  MRI brain (without) demonstrating multiple (> 20) round and ovoid, periventricular, callosal and peri-callosal, subcortical, juxtacortical, pontine and cerebellar T2 hyperintensities. Some of these are perpendicular to the lateral ventricles on axial and sagittal views. Some of these are hypointense on T1 views. Findings suspicious for demyelinating disease  CSF showed 5 oligoclonal banding, elevated IgG index   Extensive laboratory evaluations showed normal or negative CBC, CMP, Lyme titer, ANA, NMO, TSH, B12, folic acid, RPR. Positive varicellar zoster antibody  The visual evoked response test above was within normal limits on the left, with prolongation of the P100 latency on the right.   JC virus antibody was 0.15 negative in 10/18/2013  MRI of brain, and cervical spine with contrast, showed no contrast enhancement, there was no lesions noticed in MRI thoracic spine,  She started Plegridy since July 2015,  began full dose injection since Sept 15 2015. Complains of injection site reactio, lasting 2-4 weeks, she only has mild flu like illness n  She has fallen few times, more difficulty walking, when she  fell few weeks ago, Sep 15th 2015, she has difficulty getting up, could not move from waist down for 2-3 hours, also noticed slurring of her speech. She has mild fever then  She called Dr. Anne HahnWillis, had a repeat MRI of the brain Oct 2015, we have reviewed MRI together, continued evidence of periventricular white matter disease consistent with her diagnosis of multiple sclerosis, there was no change compared to previous image in  may 2015  She was started on prednisone  tapering since October 16,2015,  tolerating the medication well, seems to have mild improvement in her gait difficulty   She also complains of frequent headaches, consistent with migraines,   REVIEW OF SYSTEMS: Full 14 system review of systems performed and notable only for blurry vision, gait difficulties  ALLERGIES: Allergies  Allergen Reactions  . Sulfa Antibiotics Nausea And Vomiting    HOME MEDICATIONS: Outpatient Prescriptions Prior to Visit  Medication Sig Dispense Refill  . baclofen (ED BACLOFEN) 10 MG tablet Take 1 tablet (10 mg total) by mouth 3 (three) times daily. 90 each 11  . nortriptyline (PAMELOR) 10 MG capsule One po qhs xone week, then 2 tabs po qhs 60 capsule 11  . rizatriptan (MAXALT-MLT) 5 MG disintegrating tablet Take 1 tablet (5 mg total) by mouth as needed for migraine. May repeat in 2 hours if needed 15 tablet 11     PAST MEDICAL HISTORY: Past Medical History  Diagnosis Date  . Migraines   . Ankle fracture, right   . Knee joint dislocation     bilateral  . History of hysterectomy     PAST SURGICAL HISTORY: Past Surgical History  Procedure Laterality Date  . Abdominal hysterectomy    .  Myomectomy    . Cesarean section      FAMILY HISTORY: Family History  Problem Relation Age of Onset  . Diabetes Mother   . Osteoarthritis    . Heart disease    . Rheumatic fever    . Hypertension      SOCIAL HISTORY:  History   Social History  . Marital Status: Married    Spouse Name: [name]     Number of Children: 1  . Years of Education: college   Occupational History  .      Does not work   Social History Main Topics  . Smoking status: Never Smoker   . Smokeless tobacco: Never Used  . Alcohol Use: 0.6 oz/week    1 Glasses of wine per week     Comment: On Holidays  . Drug Use: No  . Sexual Activity: Yes    Birth Control/ Protection: Surgical   Other Topics Concern  . Not on file   Social History Narrative   Patient is married. [name]).    Patient is unemployed.   Education- College education   Right handed.   Caffeine- None     PHYSICAL EXAM   There were no vitals filed for this visit. There is no weight on file to calculate BMI.   Generalized: In no acute distress  Neck: Supple, no carotid bruits   Cardiac: Regular rate rhythm  Pulmonary: Clear to auscultation bilaterally  Musculoskeletal: No deformity  Neurological examination  Mentation: Alert oriented to time, place, history taking, and causual conversation  Cranial nerve II-XII: Pupils were equal round reactive to light extraocular movements were full, visual field were full on confrontational test. facial sensation and strength were normal. hearing was intact to finger rubbing bilaterally. Uvula tongue midline.  head turning and shoulder shrug and were normal and symmetric.Tongue protrusion into cheek strength was normal.  Motor: Moderate bilateral lower extremity spasticity, left hip flexion 4 knee flexion 4 plus, .  Sensory: Intact to fine touch, pinprick, preserved vibratory sensation, and proprioception at toes.  Coordination: Normal finger to nose, heel-to-shin bilaterally there was no truncal ataxia  Gait: Rising up from seated position without assistance, normal stance, mild wide based stiff gait, cautious, mild difficulty with tiptoe, heel and tandem walking Romberg signs: Negative  Deep tendon reflexes: Brachioradialis 2/2, biceps 2/2, triceps 2/2, patellar 3/3, Achilles 2/2,  plantar responses were flexor bilaterally.   DIAGNOSTIC DATA (LABS, IMAGING, TESTING) - I reviewed patient records, labs, notes, testing and imaging myself where available.  ASSESSMENT AND PLAN   43 years old right-handed [place] American female,  with relapsing remitting multiple sclerosis, also reported a history of constant headaches. Consistent with migraine, which has much improved with nortriptyline 10 mg every night, and Maxalt as needed  She is here for [facility]-4 visit one, Baclofen IR 10mg  tid was dispensed today, EDSS score is 5.0   Levert Feinstein, M.D. Ph.D.  [facility] [address] [place], [place] [address] [phone]

## 2014-04-13 NOTE — Progress Notes (Signed)
Dana Duke was seen today [id number] for Visit 1 (Week 1) for the [employer] [id number] Research Trial (A Placebo-Controlled Randomized Withdrawal Evaluation of the Efficacy and Safety of Baclofen ER Capsules (GRS) In Subjects with Spasticity due to Multiple Sclerosis). Since her last research visit on [id number] (Screening Visit), Dana Duke stated that there were not changes in the medical history or current medications. Dana Duke was instructed to stop prescribed Baclofen (IR) 10 mg t.i.d. and start dispensed [place]. Baclofen (IR) 10 mg t.i.d. (kit number: [id number]). Dana Duke voiced understanding. Dana Duke also received the "Daily Diary." Diary and dosing instructions were reviewed with the Dana Duke. Dana Duke was reminded to stay on stable caffeine and nicotine consumption and minimize alcohol consumption if applicable. Finally, Dana Duke's Visit 2 (Week 2) was scheduled for Wednesday, [id number] at 10:30h. In addition, Ashworth assessment was performed by [facility] Ashworth Assesor Jaci Standard, and EDSS was performed by Principal Investigator Dr. Marcial Pacas.

## 2014-04-13 NOTE — Therapy (Signed)
[facility] [address] [place], [place], [address] Phone: [phone]   Fax:  [phone]  Physical Therapy Treatment  Patient Details  Name: Dana Duke MRN: [id number] Date of Birth: 1970-07-31  Encounter Date: 04/13/2014      PT End of Session - 04/13/14 1042    Visit Number 11   Number of Visits 16   Date for PT Re-Evaluation 04/29/14   PT Start Time 0802   PT Stop Time 0843   PT Time Calculation (min) 41 min   Equipment Utilized During Treatment Gait belt   Activity Tolerance Patient tolerated treatment well   Behavior During Therapy [facility] for tasks assessed/performed      Past Medical History  Diagnosis Date  . Migraines   . Ankle fracture, right   . Knee joint dislocation     bilateral  . History of hysterectomy     Past Surgical History  Procedure Laterality Date  . Abdominal hysterectomy    . Myomectomy    . Cesarean section      There were no vitals taken for this visit.  Visit Diagnosis:  Gait difficulty  Abnormality of gait  Weakness generalized  Falls frequently      Subjective Assessment - 04/13/14 0810    Symptoms Pt denied falls or changes since last visit. Pt reported she is upset with [employer], as she had to leave 6 messages to set up the measurements for KAFO and AFO.   Patient Stated Goals improve mobility; walk, standing and peform household chores   Currently in Pain? Yes   Pain Score 7   4/10 pain R hip and 7/10 L hip pain   Pain Location Hip   Pain Orientation Left   Pain Descriptors / Indicators Aching   Pain Type Chronic pain   Pain Onset More than a month ago   Pain Frequency Constant   Aggravating Factors  weight bearing   Pain Relieving Factors none                    [facility] Adult PT Treatment/Exercise - 04/13/14 0813    Ambulation/Gait   Ambulation/Gait Yes   Ambulation/Gait Assistance 5: Supervision;6: Modified independent (Device/Increase  time)   Ambulation/Gait Assistance Details Pt ambulated over even terrain, with rollator at MOD I level without head turns and supervision while performing head turns to ensure safety as pt occasionally veered off straight path. No LOB episodes. Pt's new rollator height adjusted to fit pt. Pt ambulated without rollator, around obstacles, stepping over obstacles and through narrow spaces with min guard. Pt did require min A during one LOB episode. Pt ambulated with rollator with instructions to ambulate as fast as possible for 2x65', pt experienced one LOB which she self corrected with stepping strategy and UE support on walker. Pt required seated rest breaks after ambulation due to fatigue.   Ambulation Distance (Feet) --  72' and 400' with rollator, 50' and 230' without rollator, 65'x2   Ramp 5: Supervision   Ramp Details (indicate cue type and reason) ascended/descended ramp with rollator and supervision. cues for weight shifting.   Balance   Balance Assessed Yes   Dynamic Standing Balance   Dynamic Standing - Balance Support No upper extremity supported   Dynamic Standing - Level of Assistance Other (comment);4: Min assist  min guard   Dynamic Standing - Balance Activities Step ups  4" step ups/down    Dynamic Standing - Comments B LE step ups/down  x10/LE. Pt experience 1 LOB episode but was able to self correct. VC's to improve weight shifting. Min guard-min A required during step downs to maintain balance. Pt required seated rest breaks due to fatigue.                PT Education - 04/13/14 1039    Education provided Yes   Education Details PT discussed contacting insurance company to discuss 10 day authorization period for AFO/KAFO. PT also adjusted pt's new rollator height.   Person(s) Educated Patient   Methods Explanation   Comprehension Verbalized understanding          PT Short Term Goals - 03/30/14 1034    PT SHORT TERM GOAL #1   Title independent with HEP  (03/28/14)   Status Achieved   PT SHORT TERM GOAL #2   Title improve BERG balance score to > 30/56 for improved balance (03/28/14)   Baseline 45/56 on 03/30/14.   Status Achieved   PT SHORT TERM GOAL #3   Title ambulate > 250' with LRAD modified independent for improved functional mobility (03/28/14)   Status Achieved   PT SHORT TERM GOAL #4   Title assess for need for lower extremity orthotic (R, L, bil) and determine appropriate device if indicated (03/28/14)   Baseline 03/28/14.   Status Achieved           PT Long Term Goals - 03/30/14 1035    PT LONG TERM GOAL #1   Title verbalize understanding of fall prevention strategies and energy conservation (04/25/14)   Status On-going   PT LONG TERM GOAL #2   Title improve BERG balance score to >/= 49/56 for decreased fall risk and improved function (04/25/14)   Baseline Revised on 03/31/15 from >36 to >/=49, as pt scored 45/56 on 03/31/15.   Status Revised   PT LONG TERM GOAL #3   Title improve timed up and go to < 13 sec for improved mobility (04/25/14)   Status On-going   PT LONG TERM GOAL #4   Title ambulate > 500' on paved outdoor/various indoor surfaces with LRAD modified independent for improved functional mobility. (04/25/14)   Status On-going               Plan - 04/13/14 1042    Clinical Impression Statement Pt demonstrated progress today, as she was able to ambulate longer distances with less assist and rest breaks. Pt occasionally required min A during LOB during gait and balance training. Continue with POC.   Pt will benefit from skilled therapeutic intervention in order to improve on the following deficits Abnormal gait;Decreased activity tolerance;Decreased balance;Decreased knowledge of use of DME;Decreased strength;Impaired perceived functional ability;Pain;Decreased knowledge of precautions;Decreased endurance;Difficulty walking   Rehab Potential Good   PT Frequency Other (comment)  1x/week for last 4 weeks    PT Duration --  reduced to 1x/week for last 4 weeks.   PT Treatment/Interventions ADLs/Self Care Home Management;Gait training;Neuromuscular re-education;Functional mobility training;Patient/family education;Therapeutic activities;Electrical Stimulation;Therapeutic exercise;Manual techniques;Energy conservation;Balance training;Moist Heat;Cryotherapy   PT Next Visit Plan Dynamic gait over uneven terrain and dynamic balance on compliant surfaces without rollator.   Consulted and Agree with Plan of Care Patient        Problem List Patient Active Problem List   Diagnosis Date Noted  . Headache 10/28/2013  . Multiple sclerosis 08/23/2013  . Gait difficulty 02/22/2013    Juline Sanderford L 04/13/2014, 10:44 AM  [facility] [address] [place], [place], [address]  Phone: [phone]   Fax:  [phone]     Zerita BoersJennifer Lidia Clavijo, PT,DPT 04/13/2014 10:45 AM Phone: [phone] Fax: [phone]

## 2014-04-13 NOTE — Telephone Encounter (Signed)
I spoke to the patient to reinforce compliance of [employer] Baclofen (IR) 10 mg intake t.i.d. Patient stated that she had already taken the first capsule at 14:30h and was planning to take the evening dose at 22:30h as planned. I also reminded the patient to complete the "Daily Diary." Patient voiced understanding.

## 2014-04-18 ENCOUNTER — Ambulatory Visit: Payer: 59 | Attending: Neurology | Admitting: Occupational Therapy

## 2014-04-18 ENCOUNTER — Encounter: Payer: Self-pay | Admitting: Occupational Therapy

## 2014-04-18 ENCOUNTER — Ambulatory Visit: Payer: 59 | Admitting: Physical Therapy

## 2014-04-18 DIAGNOSIS — M6281 Muscle weakness (generalized): Secondary | ICD-10-CM | POA: Diagnosis not present

## 2014-04-18 DIAGNOSIS — Z5189 Encounter for other specified aftercare: Secondary | ICD-10-CM | POA: Insufficient documentation

## 2014-04-18 DIAGNOSIS — R278 Other lack of coordination: Secondary | ICD-10-CM

## 2014-04-18 DIAGNOSIS — R531 Weakness: Secondary | ICD-10-CM

## 2014-04-18 DIAGNOSIS — G35 Multiple sclerosis: Secondary | ICD-10-CM

## 2014-04-18 DIAGNOSIS — R269 Unspecified abnormalities of gait and mobility: Secondary | ICD-10-CM | POA: Insufficient documentation

## 2014-04-18 DIAGNOSIS — Z7409 Other reduced mobility: Secondary | ICD-10-CM

## 2014-04-18 DIAGNOSIS — R279 Unspecified lack of coordination: Secondary | ICD-10-CM

## 2014-04-18 NOTE — Therapy (Signed)
[facility] [address] [place], [place], [address] Phone: [phone]   Fax:  [phone]  Occupational Therapy Treatment  Patient Details  Name: Dana Duke MRN: [id number] Date of Birth: [birth year]  Encounter Date: 04/18/2014      OT End of Session - 04/18/14 0956    Visit Number 10   Number of Visits 16   Date for OT Re-Evaluation 05/05/14   Authorization Type UHC   Authorization Time Period week 6/8   OT Start Time 0930   OT Stop Time 1015   OT Time Calculation (min) 45 min      Past Medical History  Diagnosis Date  . Migraines   . Ankle fracture, right   . Knee joint dislocation     bilateral  . History of hysterectomy     Past Surgical History  Procedure Laterality Date  . Abdominal hysterectomy    . Myomectomy    . Cesarean section      There were no vitals taken for this visit.  Visit Diagnosis:  Generalized weakness  Decreased coordination  Impaired functional mobility, balance, and endurance  MS (multiple sclerosis)      Subjective Assessment - 04/18/14 0938    Pertinent History see epic snapshot   Repetition Increases Symptoms   Currently in Pain? --  OT not addressing secondary to outside scope of practice - Left hip and knee 6/10                 OT Treatments/Exercises (OP) - 04/18/14 6962    ADLs   Functional Mobility Pt performing dynamic standing balance to retrieve and replace objects from high cabinets reaching outside BOS to place on Rt/Lt (cross reaching) and from floor w/ no LOB. Pt then carrying laundry basket 75 ft. w/ weakness/locking noted in knees but no LOB. Pt then placed laundry basket from floor to top of washer x5.    Shoulder Exercises: ROM/Strengthening   Other ROM/Strengthening Exercises UBE x 10 min. Level 3 for strength and endurance.    Fine Motor Coordination   Small Pegboard Pt placing small pegs in pegboard manipulating 5 pegs at a time  to place in pegboard Rt and Lt hand w/ min difficulty and drops. Pt required min cues to copy design correctly and to correct mistakes.                   OT Short Term Goals - 04/04/14 1004    OT SHORT TERM GOAL #1   Title Pt will be I home program for coordination bilateral UE's - 04/04/2014   Status Achieved   OT SHORT TERM GOAL #2   Title Pt will I'ly state 2-3 energy conservation techniques that she can implement during ADL's   Status Achieved   OT SHORT TERM GOAL #3   Title Pt will be Min A LB dressing using a/e PRN    Status Achieved           OT Long Term Goals - 03/30/14 0944    OT LONG TERM GOAL #1   Title Pt will be Mod I upated HEP - 05/02/2014   Baseline 05/02/14   Time 8   Period Weeks   Status On-going   OT LONG TERM GOAL #2   Title Pt will be Mod I LB dressing using a/e PRN seated and with sit to stand.   Baseline 05/02/14   Time 8   Period Weeks   Status On-going  OT LONG TERM GOAL #3   Title Pt will demonstrate Mod I basic ADL's and homemaking tasks using a/e PRN   Baseline 05/02/14   Time 8   Period Weeks   Status On-going   OT LONG TERM GOAL #4   Title Pt will be Mod I simple snack/meal prep while implementing energy conservation techniques   Baseline 05/02/14   Time 8   Period Weeks   Status On-going               Plan - 04/18/14 0957    Clinical Impression Statement Pt progressing w/ dynamic standing balance and coordination bilaterally. Approximating all LTG's   OT Frequency 1x / week   OT Duration 8 weeks   Plan Continue strength/endurance, coordination. Begin checking LTG's. Anticipate d/c 05/05/14 (following session).         Problem List Patient Active Problem List   Diagnosis Date Noted  . Headache 10/28/2013  . Multiple sclerosis 08/23/2013  . Gait difficulty 02/22/2013    Kelli Churn, OTR/L 04/18/2014, 10:05 AM  [facility] [address] [place], [place], [address] Phone: [phone]   Fax:  [phone]

## 2014-04-19 ENCOUNTER — Telehealth: Payer: Self-pay | Admitting: Neurology

## 2014-04-19 NOTE — Telephone Encounter (Signed)
Patient informed me that her PT appointment on [id number] was scheduled at 10:15h, which would interfere with the Visit 2 research appointment at 10:30h. She wanted to know if I could see her on [id number] at 11:00h instead of 10:30h. I told her that this change was fine.

## 2014-04-19 NOTE — Telephone Encounter (Signed)
I spoke to the patient to confirm her Visit 2 for the [facility] [facility] CLR_11_04 Research Trial. I reminded the patient to bring her study medication and "Daily Diary."

## 2014-04-20 ENCOUNTER — Telehealth: Payer: Self-pay | Admitting: Neurology

## 2014-04-20 ENCOUNTER — Ambulatory Visit: Payer: 59 | Admitting: Physical Therapy

## 2014-04-20 ENCOUNTER — Ambulatory Visit (INDEPENDENT_AMBULATORY_CARE_PROVIDER_SITE_OTHER): Payer: Self-pay | Admitting: Neurology

## 2014-04-20 ENCOUNTER — Encounter: Payer: 59 | Admitting: Occupational Therapy

## 2014-04-20 ENCOUNTER — Encounter: Payer: Self-pay | Admitting: Physical Therapy

## 2014-04-20 VITALS — BP 128/78 | HR 76 | Resp 16

## 2014-04-20 DIAGNOSIS — Z5189 Encounter for other specified aftercare: Secondary | ICD-10-CM | POA: Diagnosis not present

## 2014-04-20 DIAGNOSIS — R278 Other lack of coordination: Secondary | ICD-10-CM

## 2014-04-20 DIAGNOSIS — G35 Multiple sclerosis: Secondary | ICD-10-CM

## 2014-04-20 DIAGNOSIS — R279 Unspecified lack of coordination: Secondary | ICD-10-CM

## 2014-04-20 DIAGNOSIS — R269 Unspecified abnormalities of gait and mobility: Secondary | ICD-10-CM

## 2014-04-20 DIAGNOSIS — R296 Repeated falls: Secondary | ICD-10-CM

## 2014-04-20 DIAGNOSIS — R531 Weakness: Secondary | ICD-10-CM

## 2014-04-20 NOTE — Telephone Encounter (Signed)
Update medication list

## 2014-04-20 NOTE — Progress Notes (Signed)
Dana Duke was seen today 06JAN2016 for Visit 2 (Week 2) for the [employer] CLR_09_21 Research Trial (A Placebo-Controlled Randomized Withdrawal Evaluation of the Efficacy and Safety of Baclofen ER Capsules (GRS) In Subjects with Spasticity due to Multiple Sclerosis). Since her last research visit on 30DEC2015 (Visit 1 - Week 1), patient stated that there were not changes in the medical history or current medications. An EKG was performed in triplicate at two minute intervals. C-SSRS was performed by Research Coordinator Christian Munoz Pineda with no significant findings. Patient returned 1 bottle (kit number: [id number]) of [employer] Baclofen (IR) 10 mg. with 8 remaining tablets. Patient was then dispensed Baclofen (ER) (GRS) 30 mg (kit number: [id number]). Daily Diary was collected and new pages of the Diary were given to the patient. Diary and dosing instructions were reviewed with the patient. Patient was reminded to stay on stable caffeine and nicotine consumption and minimize alcohol consumption if applicable. Finally, patient's Visit 3 (Week 4) was scheduled for Wednesday, 27JAN2016. In addition, Ashworth assessment was performed by [facility] Ashworth Assesor Rizwan Sabir. °

## 2014-04-20 NOTE — Therapy (Signed)
[facility] [address] [place], [place], [address] Phone: [phone]   Fax:  [phone]  Physical Therapy Treatment  Patient Details  Name: Dana Duke MRN: [id number] Date of Birth: 06/17/1970  Encounter Date: 04/20/2014      PT End of Session - 04/20/14 1101    Visit Number 12   Number of Visits 16   Date for PT Re-Evaluation 04/29/14   PT Start Time 1020   PT Stop Time 1058   PT Time Calculation (min) 38 min   Equipment Utilized During Treatment Gait belt   Activity Tolerance Patient tolerated treatment well   Behavior During Therapy [facility] for tasks assessed/performed      Past Medical History  Diagnosis Date  . Migraines   . Ankle fracture, right   . Knee joint dislocation     bilateral  . History of hysterectomy     Past Surgical History  Procedure Laterality Date  . Abdominal hysterectomy    . Myomectomy    . Cesarean section      There were no vitals taken for this visit.  Visit Diagnosis:  Generalized weakness  Decreased coordination  Gait difficulty  Abnormality of gait  Falls frequently  Multiple sclerosis      Subjective Assessment - 04/20/14 1024    Symptoms No changes or new falls to report. Using rollator with gait without issues.   Currently in Pain? Yes   Pain Score 6    Pain Location Hip  and left knee   Pain Orientation Left   Pain Descriptors / Indicators Aching;Sore   Pain Type Chronic pain   Pain Onset More than a month ago   Pain Frequency Constant   Aggravating Factors  weight bearing   Pain Relieving Factors none   Effect of Pain on Daily Activities limits walking and mobility in general           [facility] Adult PT Treatment/Exercise - 04/20/14 1036    Ambulation/Gait   Ambulation/Gait Yes   Ambulation/Gait Assistance 5: Supervision   Ambulation/Gait Assistance Details occasional cues on equal step length and for increased foot clearance.                         Ambulation Distance (Feet) 630 Feet  x1, 450 ft x1   Assistive device Rollator   Gait Pattern Decreased dorsiflexion - right;Decreased dorsiflexion - left;Lateral hip instability;Left genu recurvatum   Gait velocity decreased   Dynamic Standing Balance   Dynamic Standing - Balance Support No upper extremity supported;During functional activity   Dynamic Standing - Level of Assistance 4: Min assist   Dynamic Standing - Balance Activities Alternating  foot traps;Compliant surfaces   Dynamic Standing - Comments single leg stance activities with tall cones (performed on solid ground, then on blue mat): alternating forward toe taps x 10 each foot, alternating cross toe taps x 10 each foot. Both with min guard assist/min assist and cues on weight shifting and to slow down for increased control/balance.                       Self Care: Educated pt and daughter on fall prevention strategies for home. Handout with information provided.        PT Education - 04/20/14 1058    Education provided Yes   Education Details fall prevention strategies for home   Person(s) Educated Patient;Child(ren)   Methods Demonstration;Explanation;Handout  Comprehension Verbalized understanding;Returned demonstration          PT Short Term Goals - 03/30/14 1034    PT SHORT TERM GOAL #1   Title independent with HEP (03/28/14)   Status Achieved   PT SHORT TERM GOAL #2   Title improve BERG balance score to > 30/56 for improved balance (03/28/14)   Baseline 45/56 on 03/30/14.   Status Achieved   PT SHORT TERM GOAL #3   Title ambulate > 250' with LRAD modified independent for improved functional mobility (03/28/14)   Status Achieved   PT SHORT TERM GOAL #4   Title assess for need for lower extremity orthotic (R, L, bil) and determine appropriate device if indicated (03/28/14)   Baseline 03/28/14.   Status Achieved           PT Long Term Goals - 03/30/14 1035    PT LONG TERM GOAL #1    Title verbalize understanding of fall prevention strategies and energy conservation (04/25/14)   Status On-going   PT LONG TERM GOAL #2   Title improve BERG balance score to >/= 49/56 for decreased fall risk and improved function (04/25/14)   Baseline Revised on 03/31/15 from >36 to >/=49, as pt scored 45/56 on 03/31/15.   Status Revised   PT LONG TERM GOAL #3   Title improve timed up and go to < 13 sec for improved mobility (04/25/14)   Status On-going   PT LONG TERM GOAL #4   Title ambulate > 500' on paved outdoor/various indoor surfaces with LRAD modified independent for improved functional mobility. (04/25/14)   Status On-going           Plan - 04/20/14 1101    Clinical Impression Statement Pt with increased distance with gait today and needed less assistance with less balance lossess with balance activities. Pt given contact information for [employer] as second option for brace's due to her dissatifaction with [facility]. Fall prevention stategies discussed with pt and her daughter.                                    Pt will benefit from skilled therapeutic intervention in order to improve on the following deficits Abnormal gait;Decreased activity tolerance;Decreased balance;Decreased knowledge of use of DME;Decreased strength;Impaired perceived functional ability;Pain;Decreased knowledge of precautions;Decreased endurance;Difficulty walking   Rehab Potential Good   PT Frequency Other (comment)  1x/week for last 4 weeks   PT Duration --  reduced to 1x/week for last 4 weeks.   PT Treatment/Interventions ADLs/Self Care Home Management;Gait training;Neuromuscular re-education;Functional mobility training;Patient/family education;Therapeutic activities;Electrical Stimulation;Therapeutic exercise;Manual techniques;Energy conservation;Balance training;Moist Heat;Cryotherapy   PT Next Visit Plan Dynamic gait over uneven terrain and dynamic balance on compliant surfaces without rollator. Assess  remaining LTG's for discharge.   Consulted and Agree with Plan of Care Patient        Problem List Patient Active Problem List   Diagnosis Date Noted  . Headache 10/28/2013  . Multiple sclerosis 08/23/2013  . Gait difficulty 02/22/2013    Sallyanne Kuster 04/20/2014, 12:56 PM  Sallyanne Kuster, PTA, [facility] [address], [address] [place], [place] [address] [phone] 04/20/2014, 12:56 PM

## 2014-04-20 NOTE — Patient Instructions (Signed)

## 2014-04-21 ENCOUNTER — Telehealth: Payer: Self-pay | Admitting: Neurology

## 2014-04-21 NOTE — Telephone Encounter (Signed)
I spoke to the patient to reinforce compliance of the Baclofen (ER) (GRS) 30 mg. Intake q.n. 30 minutes after evening meal with a full glass of water. Patient stated that she took the first dose of Baclofen (ER) (GRS) 30 mg. Last night at 20:30h. I also reminded the patient to complete the "Daily Diary." Patient voiced understanding.

## 2014-04-22 ENCOUNTER — Telehealth: Payer: Self-pay | Admitting: Neurology

## 2014-04-22 NOTE — Telephone Encounter (Signed)
LOM is done

## 2014-04-22 NOTE — Progress Notes (Signed)
I agree above plan. 

## 2014-04-22 NOTE — Progress Notes (Signed)
See same day note by Ephriam Knuckles

## 2014-04-25 ENCOUNTER — Encounter: Payer: Self-pay | Admitting: Occupational Therapy

## 2014-04-25 ENCOUNTER — Telehealth: Payer: Self-pay

## 2014-04-25 ENCOUNTER — Ambulatory Visit: Payer: 59

## 2014-04-25 ENCOUNTER — Ambulatory Visit: Payer: 59 | Admitting: Occupational Therapy

## 2014-04-25 DIAGNOSIS — Z7409 Other reduced mobility: Secondary | ICD-10-CM

## 2014-04-25 DIAGNOSIS — R531 Weakness: Secondary | ICD-10-CM

## 2014-04-25 DIAGNOSIS — R269 Unspecified abnormalities of gait and mobility: Secondary | ICD-10-CM

## 2014-04-25 DIAGNOSIS — R279 Unspecified lack of coordination: Secondary | ICD-10-CM

## 2014-04-25 DIAGNOSIS — R278 Other lack of coordination: Secondary | ICD-10-CM

## 2014-04-25 DIAGNOSIS — Z5189 Encounter for other specified aftercare: Secondary | ICD-10-CM | POA: Diagnosis not present

## 2014-04-25 DIAGNOSIS — G35 Multiple sclerosis: Secondary | ICD-10-CM

## 2014-04-25 NOTE — Telephone Encounter (Signed)
PT called and spoke with Ms. Kohls and informed her that we will fax her AFO prescription to [facility] tomorrow (04/26/14). Pt verbalized understanding.

## 2014-04-25 NOTE — Patient Instructions (Signed)

## 2014-04-25 NOTE — Therapy (Signed)
[facility] [address] [place], [place], [address] Phone: [phone]   Fax:  [phone]  Physical Therapy Treatment  Patient Details  Name: Dana Duke MRN: [id number] Date of Birth: 1970-06-10 Referring Provider:  Helane Rima, MD  Encounter Date: 04/25/2014      PT End of Session - 04/25/14 1057    Visit Number 13   Number of Visits 16   Date for PT Re-Evaluation 04/29/14   PT Start Time 1694   PT Stop Time 1040   PT Time Calculation (min) 25 min      Past Medical History  Diagnosis Date  . Migraines   . Ankle fracture, right   . Knee joint dislocation     bilateral  . History of hysterectomy     Past Surgical History  Procedure Laterality Date  . Abdominal hysterectomy    . Myomectomy    . Cesarean section      There were no vitals taken for this visit.  Visit Diagnosis:  Generalized weakness  Decreased coordination  Impaired functional mobility, balance, and endurance  Abnormality of gait      Subjective Assessment - 04/25/14 1019    Symptoms Pt reports feeling ready for d/c today.   Currently in Pain? Yes   Pain Score 6    Pain Location Leg  hip and knee   Pain Orientation Left    Pt completed the Berg balance test (unable to enter in flow sheet due to [facility] technical difficulties) but pt scored 55/56 points, with pt missing a single point on the tandem stance activitiy.  TUG time 8.22 seconds without assistive device.  Ambulated >500' on various outdoor uneven surfaces: grass, gravel and concrete independently (without assistive device)  Self Care: Discussed fall prevention education and handout provided. See pt instructions.  Pt has been instructed to attain a new MD order for AFO to improve timliness of acquiring AFO. She is aware that the next step after attaining a new MD order is to contact [employer] for AFO provision. She states that she has the phone number for  [employer].    FOTO completed after therapy session Activities Balance Confidence increased by 90% from 1.3% on eval, to 91.3% on d/c                   PT Education - 04/25/14 1037    Education provided Yes   Education Details fALL PREVENTION ED   Person(s) Educated Patient;Parent(s)   Methods Explanation;Handout   Comprehension Verbalized understanding          PT Short Term Goals - 04/25/14 1043    PT SHORT TERM GOAL #1   Title independent with HEP (03/28/14)   Status Achieved   PT SHORT TERM GOAL #2   Title improve BERG balance score to > 30/56 for improved balance (03/28/14)   Baseline 45/56 on 03/30/14.   Status Achieved   PT SHORT TERM GOAL #3   Title ambulate > 250' with LRAD modified independent for improved functional mobility (03/28/14)   Status Achieved   PT SHORT TERM GOAL #4   Title assess for need for lower extremity orthotic (R, L, bil) and determine appropriate device if indicated (03/28/14)   Baseline 03/28/14.   Status Achieved           PT Long Term Goals - 04/25/14 1023    PT LONG TERM GOAL #1   Title verbalize understanding of fall prevention strategies and energy conservation (  04/25/14)   Status Achieved   PT LONG TERM GOAL #2   Title improve BERG balance score to > 36/56 for decreased fall risk and improved function (04/25/14)   Status On-going   PT LONG TERM GOAL #3   Title improve timed up and go to < 13 sec for improved mobility (04/25/14)   Status Achieved  TUG time = 8/22 without assistive device 04/25/14   PT LONG TERM GOAL #4   Title ambulate > 500' on paved outdoor/various indoor surfaces with LRAD modified independent for improved functional mobility. (04/25/14)   Status Achieved  500' independent without assistive device on various outdoor surfaces               Plan - 04/25/14 1058    Clinical Impression Statement Pt has met all therapy long term goals and is ready for d/c today.   PT Next Visit Plan d/c today         Problem List Patient Active Problem List   Diagnosis Date Noted  . Headache 10/28/2013  . Multiple sclerosis 08/23/2013  . Gait difficulty 02/22/2013    Delrae Sawyers D 04/25/2014, 10:59 AM  [facility] [address] [place], [place], [address] Phone: [phone]   Fax:  [phone]     PHYSICAL THERAPY DISCHARGE SUMMARY  Visits from Start of Care: 13  Current functional level related to goals / functional outcomes: See goals section   Remaining deficits: Occasional foot drop, mild unsteadiness on uneven surfaces but pt able to self correct to prevent any loss of balance.   Education / Equipment: Location manager education, home exercise program, initiated process of attaining AFO, but this has been delayed and pt decided to use a different o  Plan: Patient agrees to discharge.  Patient goals were met. Patient is being discharged due to meeting the stated rehab goals.  ?????

## 2014-04-25 NOTE — Therapy (Signed)
[facility] [address] [place], [place], [address] Phone: [phone]   Fax:  [phone]  Occupational Therapy Treatment  Patient Details  Name: Dana Duke MRN: [id number] Date of Birth: October 21, 1970 Referring Provider:  Helane Rima, MD  Encounter Date: 04/25/2014      OT End of Session - 04/25/14 1007    Visit Number 11   Number of Visits 16   Date for OT Re-Evaluation 05/05/14   Authorization Type [employer]   Authorization Time Period week 7/8   OT Start Time 0935   OT Stop Time 1015   OT Time Calculation (min) 40 min   Activity Tolerance Patient tolerated treatment well      Past Medical History  Diagnosis Date  . Migraines   . Ankle fracture, right   . Knee joint dislocation     bilateral  . History of hysterectomy     Past Surgical History  Procedure Laterality Date  . Abdominal hysterectomy    . Myomectomy    . Cesarean section      There were no vitals taken for this visit.  Visit Diagnosis:  Generalized weakness  Decreased coordination  Impaired functional mobility, balance, and endurance  MS (multiple sclerosis)      Subjective Assessment - 04/25/14 0940    Pertinent History see epic snapshot   Repetition Increases Symptoms   Currently in Pain? Yes  Lt hip and knee - OT not addressing          [facility] OT Assessment - 04/25/14 0955    Coordination   Box and Blocks Rt = 41, Lt = 43   Right 9 Hole Peg Test 25.18 sec   Left 9 Hole Peg Test 29.34 sec   Strength   Overall Strength Comments Grip strength: Rt = 77 lbs, Lt = 78 lbs               OT Treatments/Exercises (OP) - 04/25/14 0949    ADLs   Functional Mobility pt carrying 10# box and walking 75 ft. then placing on high shelf to floor x 5reps w/o difficulty.    ADL Comments assessed/reviewed LTG's and progress to date w/ pt/family. Pt w/ improved scores for Box & Blocks and 9 hole peg test bilaterally   Shoulder  Exercises: Seated   Other Seated Exercises UBE x 10 min. level 3 for strength/endurance   Fine Motor Coordination   Other Fine Motor Exercises Pt stringing straw pieces using both hands w/ only slight difficulty. Pt braiding thin rope for coordination and bilateral integration w/ only slight difficulty. Translating stress balls w/ min difficulty Rt/Lt hands.                   OT Short Term Goals - 04/04/14 1004    OT SHORT TERM GOAL #1   Title Pt will be I home program for coordination bilateral UE's - 04/04/2014   Status Achieved   OT SHORT TERM GOAL #2   Title Pt will I'ly state 2-3 energy conservation techniques that she can implement during ADL's   Status Achieved   OT SHORT TERM GOAL #3   Title Pt will be Min A LB dressing using a/e PRN    Status Achieved           OT Long Term Goals - 04/25/14 0942    OT LONG TERM GOAL #1   Title Pt will be Mod I upated HEP - 05/02/2014   Baseline 05/02/14  Time 8   Period Weeks   Status Achieved   OT LONG TERM GOAL #2   Title Pt will be Mod I LB dressing using a/e PRN seated and with sit to stand.   Baseline 05/02/14   Time 8   Period Weeks   Status Achieved   OT LONG TERM GOAL #3   Title Pt will demonstrate Mod I basic ADL's and homemaking tasks using a/e PRN   Baseline 05/02/14   Time 8   Period Weeks   Status Achieved   OT LONG TERM GOAL #4   Title Pt will be Mod I simple snack/meal prep while implementing energy conservation techniques   Baseline 05/02/14   Time 8   Period Weeks   Status Achieved               Plan - 04/25/14 1008    Clinical Impression Statement Pt met all STG's and LTG's. Pt w/ significant improvements in coordination and functional use of BUE's from 9 hole peg test and Box & Blocks scores   Plan D/C O.T. today secondary to meeting all LTG's. Pt in agreement   Consulted and Agree with Plan of Care Patient        Problem List Patient Active Problem List   Diagnosis Date Noted  .  Headache 10/28/2013  . Multiple sclerosis 08/23/2013  . Gait difficulty 02/22/2013    OCCUPATIONAL THERAPY DISCHARGE SUMMARY  Visits from Start of Care: 11  Current functional level related to goals / functional outcomes: SEE ABOVE   Remaining deficits: Endurance, strength   Education / Equipment: Pt provided w/ HEP's, energy conservation techniques, and MS society contact info  Plan: Patient agrees to discharge.  Patient goals were met. Patient is being discharged due to meeting the stated rehab goals.  ?????        Carey Bullocks, OTR/L 04/25/2014, 10:10 AM  [facility] [address] [place], [place], [address] Phone: [phone]   Fax:  [phone]

## 2014-04-26 ENCOUNTER — Telehealth: Payer: Self-pay | Admitting: Neurology

## 2014-04-26 NOTE — Telephone Encounter (Signed)
I spoke to the patient to complete Week 3 - Telephone Contact - Follow-Up Phone Call for the [facility] CLR_09_21 Research Trial. Patient stated that the spasticity control was acceptable and that the side effects of Baclofen (ER) (GRS) 30 mg. were tolerable. Since the last visit (Visit 2 - Week 2 - 06JAN2016), patient stated that there have not been any new Adverse Events or changes in medications. However, there was a change in physiotherapy: Patient's last day of OT and PT was 11JAN2016. Appointment for Visit 3 - Week 4 was rescheduled from 27JAN2016 to 19JAN2016 at 10:00h to comply with visit window.

## 2014-04-27 ENCOUNTER — Encounter: Payer: 59 | Admitting: Occupational Therapy

## 2014-05-02 ENCOUNTER — Telehealth: Payer: Self-pay | Admitting: Neurology

## 2014-05-02 NOTE — Telephone Encounter (Signed)
I spoke to the patient to remind her about her appointment on 19JAN2016 at 10:00h for Visit 3 - Week 4 fpr the [facility] trial CLR_09_21. I also asked the patient whether she was still going to physiotherapy, and she said that although she has stopped PT, because it was only for 8 weeks, she still exercises daily at her house.

## 2014-05-03 ENCOUNTER — Encounter: Payer: 59 | Admitting: Occupational Therapy

## 2014-05-03 ENCOUNTER — Ambulatory Visit (INDEPENDENT_AMBULATORY_CARE_PROVIDER_SITE_OTHER): Payer: Self-pay | Admitting: Neurology

## 2014-05-03 VITALS — BP 128/84 | HR 76 | Temp 98.1°F | Resp 16

## 2014-05-03 DIAGNOSIS — G35 Multiple sclerosis: Secondary | ICD-10-CM

## 2014-05-05 ENCOUNTER — Telehealth: Payer: Self-pay | Admitting: Neurology

## 2014-05-05 ENCOUNTER — Encounter: Payer: 59 | Admitting: Occupational Therapy

## 2014-05-05 NOTE — Progress Notes (Signed)
Kendale Kokoska (Subject # [id number]) was seen on 19JAN2016 for Visit 3 (Week 4) for the [facility] - CLR_09_21  Research Trial: A Placebo-Controlled Randomized withdrawal evaluation of the efficacy and safety of Baclofen ER capsules (GRS) in subjects with Spasticity due to Multiple Sclerosis. Patient stated no adverse events or change in medications since the last visit. Vital signs were non-clinically-significant. C-SSRS was performed by Study Coordinator: [name]. Patient was compliant with study medication (Baclofen ER (GRS) 30 mg.) Visit 4 - [place] 6 was scheduled for 03FEB2016 at 10:00h. Patient was reminded to stay on a stable dose of caffeine and nicotine consumption, minimize alcohol consumption if applicable, and take the study medication at the same time every day 30 minutes following the evening meal with a full glass of water. Additionally, patient stated that although she was no longer on physiotherapy, she was still maintaining the same exercise regimen at home. Ashworth assessment was not performed.

## 2014-05-05 NOTE — Telephone Encounter (Signed)
I left a message reminding the patient how crucial is to take the study medication at the same time every day 30 minutes after the evening meal with a full glass of water. I stressed the importance of timing of study medication.

## 2014-05-09 ENCOUNTER — Encounter: Payer: 59 | Admitting: Occupational Therapy

## 2014-05-09 ENCOUNTER — Telehealth: Payer: Self-pay | Admitting: Neurology

## 2014-05-09 NOTE — Telephone Encounter (Signed)
I have reviewed her previous medication list, she called our office in January 28 2014, Dr. Anne Hahn started prednisone Dosepack, 60 mg, decreased one tablet every other day, finished the prednisone course in 1 week, October 20 third 2015.

## 2014-05-10 NOTE — Progress Notes (Signed)
I have reviewed and agreed above plan. 

## 2014-05-11 ENCOUNTER — Telehealth: Payer: Self-pay | Admitting: Neurology

## 2014-05-11 ENCOUNTER — Ambulatory Visit: Payer: 59 | Admitting: Occupational Therapy

## 2014-05-11 NOTE — Telephone Encounter (Signed)
I left a message for the patient to return my call.

## 2014-05-11 NOTE — Telephone Encounter (Signed)
I spoke to the patient to complete Week 5 - Telephone Contact - Follow-Up Phone Call for the [facility] CLR_09_21 Research Trial. Patient stated that the spasticity control was acceptable and that the side effects of the study medicine (Baclofen (ER) (GRS) 30 mg) were tolerable. Since the last visit (Visit 3 - Week 4 - 19JAN2016), patient stated that there have not been any new adverse events, changes in medications, or in the exercise regimen. Per patient's request, Visit 4 was rescheduled to 02FEB2016 at 9:30h.

## 2014-05-17 ENCOUNTER — Ambulatory Visit (INDEPENDENT_AMBULATORY_CARE_PROVIDER_SITE_OTHER): Payer: Self-pay | Admitting: Neurology

## 2014-05-17 VITALS — BP 136/74 | HR 80 | Temp 98.1°F | Resp 12

## 2014-05-17 DIAGNOSIS — G35 Multiple sclerosis: Secondary | ICD-10-CM

## 2014-05-17 MED ORDER — RIZATRIPTAN BENZOATE 10 MG PO TBDP: 10.0000 mg | ORAL_TABLET | ORAL | Status: DC | PRN

## 2014-05-17 NOTE — Progress Notes (Signed)
Dana Duke was seen today [id number] for Visit 4 (Week 6) for the [employer] CLR_09_21 Research Trial (A Placebo-Controlled Randomized Withdrawal Evaluation of the Efficacy and Safety of Baclofen ER Capsules (GRS) In Subjects with Spasticity due to Multiple Sclerosis).   Since her last research visit on [id number] (Visit 3 - Week 4), patient stated that there were not changes in the medical history or current medications. Vital signs were non-clinically-significant. An EKG was performed in triplicate at two minute intervals: 10:18h, 10:20h, and 10:22h. Clinical Global Impression of Change (CGIC) and Subject's Global Impression of Severity (SGIS) were found to be" minimally improved" and "moderate spasticity," respectively. CGIC and SGIS were performed by Dr. Marcial Pacas. Pharmacokinetic Blood sampling was collected at 10:32h. C-SSRS was performed by [place] with no significant findings. Patient was compliant with study medicine. Patient was dispensed Baclofen (ER) (GRS) 30 mg (kit number: [id number]). Daily Diary was collected and new pages of the Diary were given to the patient.   Diary and dosing instructions were reviewed with the patient. Patient was reminded to stay on stable caffeine and nicotine consumption and minimize alcohol consumption if applicable. Finally, patient's Visit 5 (Week 10) was scheduled for Thursday, 03MAR2016 at 9:00h.

## 2014-05-17 NOTE — Progress Notes (Signed)
[facility]  PATIENT: Denna Haggard DOB: 1970/06/28  HISTORICAL (initial visit was in Nov 2014) Ms. Saxe is a 44 years old right-handed African American female, referred by Dr. Suann Larry, Janalyn Rouse, she has relapsing remitting multiple sclerosis  Since 2011 she noticed gradual onset slow worsening gait difficulty, she tends to drag her foot across the floor, stiff, has difficulty going upstairs, because of knee pain, she suffered a history of right ankle fracture in 2010, seems to dragging her right foot across the floor more, also complains urinary urgency, no bowel incontinence   MRI at [facility] in May 7th 2015, without contrast  MRI cervical spine (without) demonstrating at least 4 spinal cord T2 hyperintensities at cervico-medullary junction, C2, C3-4 and C6.  MRI brain (without) demonstrating multiple (> 20) round and ovoid, periventricular, callosal and peri-callosal, subcortical, juxtacortical, pontine and cerebellar T2 hyperintensities. Some of these are perpendicular to the lateral ventricles on axial and sagittal views. Some of these are hypointense on T1 views. Findings suspicious for demyelinating disease  CSF showed 5 oligoclonal banding, elevated IgG index   Extensive laboratory evaluations showed normal or negative CBC, CMP, Lyme titer, ANA, NMO, TSH, B12, folic acid, RPR. Positive varicellar zoster antibody  The visual evoked response test above was within normal limits on the left, with prolongation of the P100 latency on the right.   JC virus antibody was 0.15 negative in 10/18/2013  MRI of brain, and cervical spine with contrast, showed no contrast enhancement, there was no lesions noticed in MRI thoracic spine,  She started Plegridy since July 2015,  began full dose injection since Sept 15 2015. Complains of injection site reactio, lasting 2-4 weeks, she only has mild flu like illness n  She has fallen few times, more difficulty walking, when she  fell few weeks ago, Sep 15th 2015, she has difficulty getting up, could not move from waist down for 2-3 hours, also noticed slurring of her speech. She has mild fever then  She called Dr. Anne Hahn, had a repeat MRI of the brain Oct 2015, we have reviewed MRI together, continued evidence of periventricular white matter disease consistent with her diagnosis of multiple sclerosis, there was no change compared to previous image in  may 2015  She was started on prednisone  tapering since October 16,2015,  tolerating the medication well, seems to have mild improvement in her gait difficulty   She also complains of frequent headaches, consistent with migraines,   UPDATE Feb 2nd 2016:  Last visit Jan 19th 2016, this is [name] 9-21 visit, she continued to have intermittent headaches, Maxalt 5 mg works most of the time, but sometimes with severe prolonged headaches, repeat dose of Maxalt fail to improve her symptoms, sleep always helps  REVIEW OF SYSTEMS: Full 14 system review of systems performed and notable only for blurry vision, gait difficulties  ALLERGIES: Allergies  Allergen Reactions  . Sulfa Antibiotics Nausea And Vomiting    HOME MEDICATIONS: Outpatient Prescriptions Prior to Visit  Medication Sig Dispense Refill  . baclofen (ED BACLOFEN) 10 MG tablet Take 1 tablet (10 mg total) by mouth 3 (three) times daily. 90 each 11  . nortriptyline (PAMELOR) 10 MG capsule One po qhs xone week, then 2 tabs po qhs 60 capsule 11  . rizatriptan (MAXALT-MLT) 5 MG disintegrating tablet Take 1 tablet (5 mg total) by mouth as needed for migraine. May repeat in 2 hours if needed 15 tablet 11     PAST MEDICAL HISTORY: Past Medical History  Diagnosis Date  . Migraines   . Ankle fracture, right   . Knee joint dislocation     bilateral  . History of hysterectomy     PAST SURGICAL HISTORY: Past Surgical History  Procedure Laterality Date  . Abdominal hysterectomy    . Myomectomy    . Cesarean  section      FAMILY HISTORY: Family History  Problem Relation Age of Onset  . Diabetes Mother   . Osteoarthritis    . Heart disease    . Rheumatic fever    . Hypertension      SOCIAL HISTORY:  History   Social History  . Marital Status: Married    Spouse Name: [name]    Number of Children: 1  . Years of Education: college   Occupational History  .      Does not work   Social History Main Topics  . Smoking status: Never Smoker   . Smokeless tobacco: Never Used  . Alcohol Use: 0.6 oz/week    1 Glasses of wine per week     Comment: On Holidays  . Drug Use: No  . Sexual Activity: Yes    Birth Control/ Protection: Surgical   Other Topics Concern  . Not on file   Social History Narrative   Patient is married. [name]).    Patient is unemployed.   Education- College education   Right handed.   Caffeine- None     PHYSICAL EXAM   There were no vitals filed for this visit. There is no weight on file to calculate BMI.   Generalized: In no acute distress  Neck: Supple, no carotid bruits   Cardiac: Regular rate rhythm  Pulmonary: Clear to auscultation bilaterally  Musculoskeletal: No deformity  Neurological examination  Mentation: Alert oriented to time, place, history taking, and causual conversation  Cranial nerve II-XII: Pupils were equal round reactive to light extraocular movements were full, visual field were full on confrontational test. facial sensation and strength were normal. hearing was intact to finger rubbing bilaterally. Uvula tongue midline.  head turning and shoulder shrug and were normal and symmetric.Tongue protrusion into cheek strength was normal.  Motor: Moderate bilateral lower extremity spasticity, mild weakness, left hip flexion 4 knee flexion 4 plus, .  Sensory: Intact to fine touch, pinprick, preserved vibratory sensation, and proprioception at toes.  Coordination: Normal finger to nose, heel-to-shin bilaterally there was no truncal  ataxia  Gait: Rising up from seated position without assistance, wide-based,stiff gait, cautious, mild difficulty with tiptoe, heel and tandem walking Romberg signs: Negative  Deep tendon reflexes: Brachioradialis 2/2, biceps 2/2, triceps 2/2, patellar 3/3, Achilles 2/2, plantar responses were flexor bilaterally.   DIAGNOSTIC DATA (LABS, IMAGING, TESTING) - I reviewed patient records, labs, notes, testing and imaging myself where available.  ASSESSMENT AND PLAN   44 years old right-handed [place] American female,  with relapsing remitting multiple sclerosis, also reported a history of constant headaches. Consistent with migraine, which has much improved with nortriptyline 10 mg every night, and Maxalt as needed  She is here for [name] 9-21 visit, tolerating Balofen ER  daily well. Continue follow up according to protocol. She continued to have intermittent migraine headaches, Maxalt 5 mg works most of the time, I will increase the dosage to 10 mg as needed, may also taking Benadryl, along with Excedrin Migraine for severe prolonged headaches   Levert Feinstein, M.D. Ph.D.  [facility] [address] [place], [place] [address] [phone]

## 2014-05-18 ENCOUNTER — Encounter: Payer: 59 | Admitting: Neurology

## 2014-06-14 ENCOUNTER — Telehealth: Payer: Self-pay | Admitting: Neurology

## 2014-06-14 ENCOUNTER — Ambulatory Visit (INDEPENDENT_AMBULATORY_CARE_PROVIDER_SITE_OTHER): Payer: Self-pay | Admitting: Neurology

## 2014-06-14 DIAGNOSIS — R9431 Abnormal electrocardiogram [ECG] [EKG]: Secondary | ICD-10-CM

## 2014-06-14 DIAGNOSIS — Z0289 Encounter for other administrative examinations: Secondary | ICD-10-CM

## 2014-06-14 NOTE — Telephone Encounter (Signed)
She is feeling fine, no chest pain, no shortness of breath, no fainting spells, there was no previous cardiac history,  She will return to clinic for repeat EKG today,

## 2014-06-14 NOTE — Telephone Encounter (Signed)
Patient arrived for EKG.

## 2014-06-14 NOTE — Progress Notes (Signed)
Will refer patient to cardiologist, EKG showed persistent T-wave abnormalities.

## 2014-06-15 ENCOUNTER — Telehealth: Payer: Self-pay | Admitting: *Deleted

## 2014-06-15 ENCOUNTER — Telehealth: Payer: Self-pay | Admitting: Neurology

## 2014-06-15 NOTE — Telephone Encounter (Signed)
Dana Duke is returning your call regarding the abnormal EKG stating she sent the note to Dr. Gus Puma, but she says she does not have any notes to go with the referral.  She has only seen her for a rash.  So she is not clear on who should be doing the referral.  If you have questions please call [phone] and ask for [facility].

## 2014-06-15 NOTE — Telephone Encounter (Signed)
See other enc.

## 2014-06-15 NOTE — Telephone Encounter (Signed)
Called Dr. Isaias Cowman office at [facility] [phone] - transferred to her referral department [phone] - spoke to Nile Dear who will make the cardiology referral for this patient.

## 2014-06-16 ENCOUNTER — Ambulatory Visit (INDEPENDENT_AMBULATORY_CARE_PROVIDER_SITE_OTHER): Payer: Self-pay | Admitting: Neurology

## 2014-06-16 VITALS — BP 134/82 | HR 76 | Temp 98.5°F | Resp 12

## 2014-06-16 DIAGNOSIS — G35 Multiple sclerosis: Secondary | ICD-10-CM

## 2014-06-16 DIAGNOSIS — G43009 Migraine without aura, not intractable, without status migrainosus: Secondary | ICD-10-CM | POA: Insufficient documentation

## 2014-06-16 MED ORDER — SUMATRIPTAN SUCCINATE 100 MG PO TABS: 100.0000 mg | ORAL_TABLET | Freq: Once | ORAL | Status: DC | PRN

## 2014-06-16 MED ORDER — TOPIRAMATE 100 MG PO TABS: 100.0000 mg | ORAL_TABLET | Freq: Two times a day (BID) | ORAL | Status: DC

## 2014-06-16 NOTE — Progress Notes (Signed)
[facility]  PATIENT: Denna Haggard DOB: 1971/01/30  HISTORICAL (initial visit was in Nov 2014) Ms. Lathon is a 44 years old right-handed African American female, referred by Dr. Suann Larry, Janalyn Rouse, she has relapsing remitting multiple sclerosis  Since 2011 she noticed gradual onset slow worsening gait difficulty, she tends to drag her foot across the floor, stiff, has difficulty going upstairs, because of knee pain, she suffered a history of right ankle fracture in 2010, seems to dragging her right foot across the floor more, also complains urinary urgency, no bowel incontinence   MRI at [facility] in May 7th 2015, without contrast  MRI cervical spine (without) demonstrating at least 4 spinal cord T2 hyperintensities at cervico-medullary junction, C2, C3-4 and C6.  MRI brain (without) demonstrating multiple (> 20) round and ovoid, periventricular, callosal and peri-callosal, subcortical, juxtacortical, pontine and cerebellar T2 hyperintensities. Some of these are perpendicular to the lateral ventricles on axial and sagittal views. Some of these are hypointense on T1 views. Findings suspicious for demyelinating disease  CSF showed 5 oligoclonal banding, elevated IgG index   Extensive laboratory evaluations showed normal or negative CBC, CMP, Lyme titer, ANA, NMO, TSH, B12, folic acid, RPR. Positive varicellar zoster antibody  The visual evoked response test above was within normal limits on the left, with prolongation of the P100 latency on the right.   JC virus antibody was 0.15 negative in 10/18/2013  MRI of brain, and cervical spine with contrast, showed no contrast enhancement, there was no lesions noticed in MRI thoracic spine,  She started Plegridy since July 2015,  began full dose injection since Sept 15 2015. Complains of injection site reactio, lasting 2-4 weeks, she only has mild flu like illness n  She has fallen few times, more difficulty walking, when she  fell few weeks ago, Sep 15th 2015, she has difficulty getting up, could not move from waist down for 2-3 hours, also noticed slurring of her speech. She has mild fever then  She called Dr. Anne Hahn, had a repeat MRI of the brain Oct 2015, we have reviewed MRI together, continued evidence of periventricular white matter disease consistent with her diagnosis of multiple sclerosis, there was no change compared to previous image in  may 2015  She was started on prednisone  tapering since October 16,2015,  tolerating the medication well, seems to have mild improvement in her gait difficulty   She also complains of frequent headaches, consistent with migraines,   UPDATE Feb 2nd 2016:  Last visit Jan 19th 2016, this is [name] 9-21 visit, she continued to have intermittent headaches, Maxalt 5 mg works most of the time, but sometimes with severe prolonged headaches, repeat dose of Maxalt fail to improve her symptoms, sleep always helps  UPDATE March 3rd 2016: She complains of worsening headaches almost daily, moderate, tried maxalt.without significant improvement, she is taking baclofen ER , nortriptyline  qhs. Also with bilateral knee and hip pain, left worse than right, bilateral feet numbness, coldness sensation, which are chronic.  REVIEW OF SYSTEMS: Full 14 system review of systems performed and notable only for blurry vision, gait difficulties  ALLERGIES: Allergies  Allergen Reactions  . Sulfa Antibiotics Nausea And Vomiting    HOME MEDICATIONS: Outpatient Prescriptions Prior to Visit  Medication Sig Dispense Refill  . baclofen (ED BACLOFEN) 10 MG tablet Take 1 tablet (10 mg total) by mouth 3 (three) times daily. 90 each 11  . nortriptyline (PAMELOR) 10 MG capsule One po qhs xone week, then 2 tabs  po qhs 60 capsule 11  . rizatriptan (MAXALT-MLT) 5 MG disintegrating tablet Take 1 tablet (5 mg total) by mouth as needed for migraine. May repeat in 2 hours if needed 15 tablet 11      PAST MEDICAL HISTORY: Past Medical History  Diagnosis Date  . Migraines   . Ankle fracture, right   . Knee joint dislocation     bilateral  . History of hysterectomy     PAST SURGICAL HISTORY: Past Surgical History  Procedure Laterality Date  . Abdominal hysterectomy    . Myomectomy    . Cesarean section      FAMILY HISTORY: Family History  Problem Relation Age of Onset  . Diabetes Mother   . Osteoarthritis    . Heart disease    . Rheumatic fever    . Hypertension      SOCIAL HISTORY:  History   Social History  . Marital Status: Married    Spouse Name: [name]  . Number of Children: 1  . Years of Education: college   Occupational History  .      Does not work   Social History Main Topics  . Smoking status: Never Smoker   . Smokeless tobacco: Never Used  . Alcohol Use: 0.6 oz/week    1 Glasses of wine per week     Comment: On Holidays  . Drug Use: No  . Sexual Activity: Yes    Birth Control/ Protection: Surgical   Other Topics Concern  . Not on file   Social History Narrative   Patient is married. [name]).    Patient is unemployed.   Education- College education   Right handed.   Caffeine- None     PHYSICAL EXAM   There were no vitals filed for this visit. There is no weight on file to calculate BMI.   Alert, cooperative, stiff, cautious, unsteady gait.  DIAGNOSTIC DATA (LABS, IMAGING, TESTING) - I reviewed patient records, labs, notes, testing and imaging myself where available.  ASSESSMENT AND PLAN   44 years old right-handed [place] American female,  with relapsing remitting multiple sclerosis, also reported a history of constant headaches. Consistent with migraine, which has much improved with nortriptyline 10 mg every night, and Maxalt as needed  She is here for [name] 9-21 visit, tolerating Balofen ER 30mg  daily well. Continue follow up according to protocol. She continued to have intermittent migraine headaches, add on  preventive medication Topamax 100mg  bid, Maxalt 54m prn did not work, new rx imitrex 100mg  prn.  Levert Feinstein, M.D. Ph.D.  [facility] [address], [address] [place], [place] [address] [phone]

## 2014-06-16 NOTE — Progress Notes (Addendum)
Dana Duke was seen today [id number] for Visit 5 (Week 10) for the [employer] CLR_09_21 Research Trial (A Placebo-Controlled Randomized Withdrawal Evaluation of the Efficacy and Safety of Baclofen ER Capsules (GRS) In Subjects with Spasticity due to Multiple Sclerosis).   Since her last research visit on 02FEB2016 (Visit 4 - Week 6), patient stated that there were not changes in her medications. However, patient expressed worsening migraine. Dr. Krista Blue prescribed Topamax 100 mg b.i.d. for the migraines. Vital signs were non-clinically-significant. Pharmacokinetic Blood sampling was collected at 11:05. C-SSRS was performed by [place] with no significant findings. Patient returned study medicine (Kit number: [id number]) and was found to be compliant with study medicine. Patient was dispensed Baclofen (ER) (GRS) 30 mg (kit number: [id number]). Daily Diary was collected and new pages of the Diary were given to the patient.  Due to an abnormal EKG report from 28FEB2016 (EKG performed on 21DEC2016), an EKG was performed during today's visit in triplicate at two minute intervals:10:39h, 10:41h, and 10:43h. Dr. Krista Blue referred patient to Cardiologist.  Diary and dosing instructions were reviewed with the patient. Patient was reminded to stay on stable caffeine and nicotine consumption and minimize alcohol consumption if applicable. Finally, patient's Visit 6 (Week 14) was scheduled for Tuesday, 29MAR2016 at 09:30 and unscheduled PK Draw was scheduled for Friday, 04MAR2016 at 12:30h.

## 2014-06-16 NOTE — Addendum Note (Signed)
Addended by: Karie Chimera, Ephriam Knuckles D on: 06/16/2014 04:01 PM   Modules accepted: Level of Service

## 2014-06-16 NOTE — Progress Notes (Signed)
I have reviewed and agreed above plan. 

## 2014-06-17 ENCOUNTER — Ambulatory Visit (INDEPENDENT_AMBULATORY_CARE_PROVIDER_SITE_OTHER): Payer: Self-pay | Admitting: Neurology

## 2014-06-17 DIAGNOSIS — Z0289 Encounter for other administrative examinations: Secondary | ICD-10-CM

## 2014-06-17 DIAGNOSIS — G35 Multiple sclerosis: Secondary | ICD-10-CM

## 2014-06-17 NOTE — Progress Notes (Signed)
I have reviewed and agreed above plan. 

## 2014-06-17 NOTE — Progress Notes (Signed)
Meloni Spath was seen today 04MAR2016 for Unscheduled PK Draw for the [employer] [facility] CLR_09_21 Research Trial (A Placebo-Controlled Randomized Withdrawal Evaluation of the Efficacy and Safety of Baclofen ER Capsules (GRS) In Subjects with Spasticity due to Multiple Sclerosis).

## 2014-06-17 NOTE — Telephone Encounter (Signed)
Left message for the patient on both her cell and home numbers to let her know the status of her cardiology evaluation.  Also, left message for the referral coordinator to call me back.

## 2014-06-20 NOTE — Telephone Encounter (Signed)
Patient is returning [place] call. Please call patient at [phone]

## 2014-06-20 NOTE — Telephone Encounter (Signed)
I spoke to Dana Duke today and she also called the patient.  Pt has a cardiology appt with Dr. Peter Swaziland on 06/23/14 at 3:30pm.

## 2014-06-22 ENCOUNTER — Telehealth (HOSPITAL_COMMUNITY): Payer: Self-pay | Admitting: Cardiology

## 2014-06-22 NOTE — Telephone Encounter (Signed)
Received records from [facility]) for appointment on 06/23/14 with Dr Swaziland.  Records given to [facility] (medical records) for Dr Elvis Coil schedule on 06/23/14.  lp

## 2014-06-23 ENCOUNTER — Encounter: Payer: Self-pay | Admitting: Cardiology

## 2014-06-23 ENCOUNTER — Ambulatory Visit (INDEPENDENT_AMBULATORY_CARE_PROVIDER_SITE_OTHER): Payer: 59 | Admitting: Cardiology

## 2014-06-23 VITALS — BP 110/90 | HR 94 | Ht 66.0 in | Wt 197.7 lb

## 2014-06-23 DIAGNOSIS — R9431 Abnormal electrocardiogram [ECG] [EKG]: Secondary | ICD-10-CM

## 2014-06-24 NOTE — Progress Notes (Signed)
Dana Duke Date of Birth: 1970-09-01 Medical Record #[id number]  History of Present Illness: Dana Duke is seen at the request of Dr. Terrace Arabia for evaluation of an abnormal Ecg. She is a pleasant 44 yo BF followed by Neurology for Multiple sclerosis and migraines. She has no prior cardiac history. She was evaluated for a new medication to treat muscle spasms and an Ecg was obtained in Dec. 2015. Repeat Ecg in March. No history of heart murmur, family history of heart disease. No history of DM, HL, or HTN. No symptoms of chest pain, dyspnea, or edema. No palpitations. No Ecg prior to Dec.     Medication List       This list is accurate as of: 06/23/14 11:59 PM.  Always use your most recent med list.               BACLOFEN PO  Take 30 mg by mouth daily. Study drug-Baclofen ER (GRS)     nortriptyline 10 MG capsule  Commonly known as:  PAMELOR  One po qhs xone week, then 2 tabs po qhs     PLEGRIDY [place]  Inject into the skin every 14 (fourteen) days.     rizatriptan 10 MG disintegrating tablet  Commonly known as:  MAXALT-MLT  Take 1 tablet (10 mg total) by mouth as needed for migraine. May repeat in 2 hours if needed     SUMAtriptan 100 MG tablet  Commonly known as:  IMITREX  Take 1 tablet (100 mg total) by mouth once as needed for migraine. May repeat in 2 hours if headache persists or recurs.     topiramate 100 MG tablet  Commonly known as:  TOPAMAX  Take 1 tablet (100 mg total) by mouth 2 (two) times daily.         Allergies  Allergen Reactions  . Sulfa Antibiotics Nausea And Vomiting    Past Medical History  Diagnosis Date  . Migraines   . Ankle fracture, right   . Knee joint dislocation     bilateral  . History of hysterectomy   . Multiple sclerosis     Past Surgical History  Procedure Laterality Date  . Abdominal hysterectomy    . Myomectomy    . Cesarean section      History   Social History  . Marital Status: Married    Spouse Name: [name]  . Number  of Children: 1  . Years of Education: college   Occupational History  .      Does not work   Social History Main Topics  . Smoking status: Never Smoker   . Smokeless tobacco: Never Used  . Alcohol Use: 0.6 oz/week    1 Glasses of wine per week     Comment: On Holidays  . Drug Use: No  . Sexual Activity: Yes    Birth Control/ Protection: Surgical   Other Topics Concern  . None   Social History Narrative   Patient is married. [name]).    Patient is unemployed.   Education- College education   Right handed.   Caffeine- None    Family History  Problem Relation Age of Onset  . Diabetes Mother   . Osteoarthritis    . Heart disease    . Rheumatic fever    . Hypertension      Review of Systems: The review of systems is positive for muscle spasms related to multiple sclerosis. Migraine HA.  All other systems were reviewed and are negative.  Physical  Exam: BP 110/90 mmHg  Pulse 94  Ht  (1.676 m)  Wt 197 lb 11.2 oz (89.676 kg)  BMI 31.92 kg/m2 Filed Weights   06/23/14 1617  Weight: 197 lb 11.2 oz (89.676 kg)   GENERAL:  Well appearing BF in NAD HEENT:  PERRL, EOMI, sclera are clear. Oropharynx is clear. NECK:  No jugular venous distention, carotid upstroke brisk and symmetric, no bruits, no thyromegaly or adenopathy LUNGS:  Clear to auscultation bilaterally CHEST:  Unremarkable HEART:  RRR,  PMI not displaced or sustained,S1 and S2 within normal limits, no S3, no S4: no clicks, no rubs, no murmurs ABD:  Soft, nontender. BS +, no masses or bruits. No hepatomegaly, no splenomegaly EXT:  2 + pulses throughout, no edema, no cyanosis no clubbing SKIN:  Warm and dry.  No rashes NEURO:  Alert and oriented x 3. Cranial nerves II through XII intact. PSYCH:  Cognitively intact   LABORATORY DATA: Ecg today shows NSR with nonspecific T wave abnormality. Normal QT. I have personally reviewed and interpreted this study. Ecg compared with tracings on Dec. 21, March 1 and 6.  Tracings on March 6 have limb lead reversal- otherwise there is no change.   Assessment / Plan: 1. Ecg abnormality. Review of tracings show a nonspecific T wave abnormality. No old Ecg to compare. She has no cardiac symptoms and has a normal cardiac exam. No cardiac risk factors. I have explained that these changes are benign and represent a normal variant. No further cardiac work up needed. I will follow up prn.

## 2014-06-27 ENCOUNTER — Encounter: Payer: Self-pay | Admitting: Cardiology

## 2014-07-12 ENCOUNTER — Ambulatory Visit: Payer: 59

## 2014-07-12 ENCOUNTER — Telehealth: Payer: Self-pay | Admitting: Neurology

## 2014-07-12 VITALS — BP 114/82 | HR 72 | Temp 98.0°F | Resp 16

## 2014-07-12 DIAGNOSIS — Z006 Encounter for examination for normal comparison and control in clinical research program: Secondary | ICD-10-CM

## 2014-07-12 NOTE — Telephone Encounter (Signed)
Patient called to make sure that appointment was scheduled on the right date: Appointment for the [facility] [facility] research trial (Visit 7) is scheduled for Thursday, 28APR2016 at 09:00h.

## 2014-07-12 NOTE — Progress Notes (Signed)
Dana Duke (DOB:1970/09/19) was seen today, [id number] for Visit 6 (Week 14) for the [employer] CLR_09_21 Research Trial (A Placebo-Controlled Randomized Withdrawal Evaluation of the Efficacy and Safety of Baclofen ER Capsules (GRS) In Subjects with Spasticity due to Multiple Sclerosis).   Since her last research visit on [id number] (Visit 5 - Week 10), patient stated no adverse events or change in medications. Vital signs were non-clinically-significant. Pharmacokinetic Blood sampling was collected at 10:38h. C-SSRS was performed by [place] with no significant findings. Patient returned study medicine (Kit number: [id number]) and was found to be compliant with study medicine. Patient was dispensed Baclofen (ER) (GRS) 30 mg (kit number: [id number]). Daily Diary was collected and new pages of the Diary were given to the patient.  Diary and dosing instructions were reviewed with the patient. Patient was reminded to stay on stable caffeine and nicotine consumption and minimize alcohol consumption if applicable. Finally, patient's Visit 7 (Week 18) was scheduled for Thursday, 28APR2016 at 09:00.

## 2014-07-19 ENCOUNTER — Telehealth: Payer: Self-pay | Admitting: Neurology

## 2014-07-19 NOTE — Telephone Encounter (Signed)
I called back.  Ins does not cover Plegridy.  They are requesting copies of Office Visit notes to further review the request for this medication.  All info has been sent.

## 2014-07-19 NOTE — Telephone Encounter (Signed)
Becky with [facility] is calling stating pt's prior auth was denied for Peginterferon Beta-1a (PLEGRIDY ). She will need a letter of necessity to be written and faxed to [phone].  Please call Becky @ [phone] ext. [phone].

## 2014-07-20 NOTE — Progress Notes (Signed)
I have reviewed and agreed above plan. 

## 2014-08-04 NOTE — Telephone Encounter (Signed)
I called back.  Spoke with Dana Duke.  Advised info was faxed on 04/05, and we received a letter via mail dated 4/12 indicating they have received the request and it is under review.  She has noted the file and they will follow up with ins, and will call us back if anything further is needed.

## 2014-08-04 NOTE — Telephone Encounter (Signed)
Becky from [facility] called regarding the appeal for Plegridy. She states that [facility] has not received the appeal. Kriste Basque wanted to make sure the correct fax number for [facility] Shanda Bumps has on file is Fax# [phone]. Reola Calkins can be reached @ [phone] x [phone]

## 2014-08-10 ENCOUNTER — Telehealth: Payer: Self-pay | Admitting: Neurology

## 2014-08-10 NOTE — Telephone Encounter (Signed)
Patient is calling to reschedule appointment from 09:00h to 08:30. Appointment was moved.

## 2014-08-11 ENCOUNTER — Ambulatory Visit (INDEPENDENT_AMBULATORY_CARE_PROVIDER_SITE_OTHER): Payer: Self-pay | Admitting: Neurology

## 2014-08-11 VITALS — BP 118/82 | HR 82 | Temp 97.7°F | Resp 12

## 2014-08-11 DIAGNOSIS — G35 Multiple sclerosis: Secondary | ICD-10-CM

## 2014-08-11 DIAGNOSIS — G43009 Migraine without aura, not intractable, without status migrainosus: Secondary | ICD-10-CM

## 2014-08-11 MED ORDER — ONDANSETRON HCL 4 MG PO TABS: 4.0000 mg | ORAL_TABLET | Freq: Three times a day (TID) | ORAL | Status: DC | PRN

## 2014-08-11 NOTE — Progress Notes (Signed)
Reviewed agree above,  She complains of frequent migraine headaches, in the setting of bronchitis, will continue Topamax 100 mg twice a day, nortriptyline 20 mg every day as preventive medications,  Maxalt as needed, add  on Zofran as needed for migraine,

## 2014-08-11 NOTE — Telephone Encounter (Signed)
I called [employer] to follow up on the request.  Spoke with Collette.  She said this process can take up to 30 business days.  I contacted [employer] at [phone].  Spoke with Deena.  Explained the situation.  Says she will place order for patient to get up to 3 pens at no charge while this is pending.  She will have [employer] reach out to the patient to schedule delivery.  They will call us back if anything further is needed.

## 2014-08-11 NOTE — Patient Instructions (Addendum)
Magnesium oxide 400mg  Riboflavin 100mg   Twice a day as preventive medications, along with Topamax 100mg  twice a day, Nortriptyline 10mg  2 tabs every night.  Only use Maxalt=Rizatriptan for severe headache, limit the use to less than 3 times a week, less than 2 tabs in 24 hours.  When you get severe headaches,   You should take Maxalt 10mg  as needed, you may also take Zofran 4mg  for nause, if needed, even with Aleve 200mg , sleep afterward.  Call office for prolonged headaches

## 2014-08-12 ENCOUNTER — Telehealth: Payer: Self-pay | Admitting: Neurology

## 2014-08-12 NOTE — Telephone Encounter (Signed)
I called back and spoke with the patient.  She is aware [employer] will be shipping her medication at no cost while ins appeal is pending.

## 2014-08-12 NOTE — Telephone Encounter (Signed)
Shanda Bumps, would you please help patient clarify on this. Thanks

## 2014-08-12 NOTE — Progress Notes (Signed)
Dana Duke (DOB:08-Nov-1970) was seen on 28APR2016 for Visit 7 (Week 18) for the [employer] [id number] Research Trial (A Placebo-Controlled Randomized Withdrawal Evaluation of the Efficacy and Safety of Baclofen ER Capsules (GRS) In Subjects with Spasticity due to Multiple Sclerosis). Patient was accompanied by her husband.  Since her last research visit on 29MAR2016 (Visit 6 - Week 14), patient experienced Bronchitis on [id number] through 23APR2016 and was treated with prednisone 74m PO q.d. Vital signs were non-clinically-significant. An EKG was performed in triplicate at two minute intervals: 09:00h, 09:02h, and 09:04h. Clinical Laboratory Tests and Pharmacokinetic Blood sampling were collected at 09:50. Clinical Global Impression of Change (CGIC) and Subject's Global Impression of Severity (SGIS) were found to be "no change" and "moderate spasticity," respectively. CGIC and SGIS were performed by Dr. YMarcial Pacas [facility]. Patient returned study medicine (Kit number: [id number] and was found to be compliant with study medicine. Patient was randomized (Randomization #: [id number] into the double-blind, placebo-controlled, down-titration part of the study. Patient was then dispensed Baclofen (ER) (GRS) 30 mg or placebo (kit number: [id number]. Daily Diary was collected and new pages of the Diary were given to the patient.  Diary and dosing instructions were reviewed with the patient. Patient was reminded to stay on stable caffeine and nicotine consumption and minimize alcohol consumption if applicable. Ashworth assessment was performed by Ashworth assessor RLand O'Lakes Finally, patient's Visit 8 (Week 19) was scheduled for Tuesday, [id number] 08:00h.

## 2014-08-12 NOTE — Telephone Encounter (Signed)
Patient received a call from [employer] requesting her to call the office and ask Dr. Terrace Arabia that she has ordered Peginterferon Beta-1a (PLEGRIDY ) for the patient.

## 2014-08-15 ENCOUNTER — Telehealth: Payer: Self-pay | Admitting: Neurology

## 2014-08-15 NOTE — Telephone Encounter (Signed)
I spoke to the patient to confirm her appointment for 03MAY2016 at 08:00 for the [facility] study. I also reminded to bring her study medication along with the diary pages.

## 2014-08-16 ENCOUNTER — Ambulatory Visit (INDEPENDENT_AMBULATORY_CARE_PROVIDER_SITE_OTHER): Payer: Self-pay | Admitting: Neurology

## 2014-08-16 DIAGNOSIS — G35 Multiple sclerosis: Secondary | ICD-10-CM

## 2014-08-16 NOTE — Progress Notes (Signed)
Documented separately. ?

## 2014-08-17 NOTE — Progress Notes (Signed)
I have reviewed and agreed above plan. 

## 2014-08-18 ENCOUNTER — Telehealth: Payer: Self-pay

## 2014-08-18 NOTE — Telephone Encounter (Signed)
Contacting pt to inform her of rescheduling from bump list, voice message was left with pt to call back, please reschedule pt with Dr. Terrace Arabia in a 30 min slot

## 2014-08-18 NOTE — Telephone Encounter (Signed)
Tried to call back X2.  Got no answer.  Tried to leave a message, but it kept cutting off.

## 2014-08-18 NOTE — Telephone Encounter (Signed)
DR. Domenick Bookbinder with independent review from patient's insurance called requesting to speak with DR. Terrace Arabia in regards to the appeal for [facility]  Dr. Dione Housekeeper can be reached @  [phone]

## 2014-08-19 NOTE — Telephone Encounter (Signed)
Called back again.  Got no answer.  Left message.

## 2014-08-23 NOTE — Telephone Encounter (Signed)
Inetta Fermo from [facility] called regarding an appeal that was received for the [facility]. The medication is not covered at this time and an alternative medication needs to be tried first. A letter will be sent tot Dr. Terrace Arabia and the patient. Inetta Fermo can be reached @ [phone] Reference# [id number]

## 2014-08-24 ENCOUNTER — Encounter (INDEPENDENT_AMBULATORY_CARE_PROVIDER_SITE_OTHER): Payer: Self-pay | Admitting: Neurology

## 2014-08-24 DIAGNOSIS — Z0289 Encounter for other administrative examinations: Secondary | ICD-10-CM

## 2014-08-26 ENCOUNTER — Telehealth: Payer: Self-pay

## 2014-08-26 NOTE — Telephone Encounter (Signed)
I spoke with Marcelino Duster at [employer].  She said the patient has been enrolled in the free Drug Program for Plegridy, and will be receiving this med at no cost.  Stated nothing further is needed from [place], and patient has been advised.  She will remain in this program as long as she is eligible, as ins has denied all requests for coverage of this drug.

## 2014-08-31 ENCOUNTER — Encounter (INDEPENDENT_AMBULATORY_CARE_PROVIDER_SITE_OTHER): Payer: 59 | Admitting: Neurology

## 2014-08-31 ENCOUNTER — Encounter: Payer: Self-pay | Admitting: Neurology

## 2014-08-31 DIAGNOSIS — Z0289 Encounter for other administrative examinations: Secondary | ICD-10-CM

## 2014-09-07 ENCOUNTER — Ambulatory Visit (INDEPENDENT_AMBULATORY_CARE_PROVIDER_SITE_OTHER): Payer: Self-pay | Admitting: Neurology

## 2014-09-07 DIAGNOSIS — G35 Multiple sclerosis: Secondary | ICD-10-CM

## 2014-09-07 NOTE — Progress Notes (Addendum)
[facility]  PATIENT: Dana Duke DOB: [birth year]  HISTORICAL (initial visit was in Nov 2014) Dana Duke is a 44 years old right-handed African American female, referred by Dr. Suann Duke, Dana Duke, she has relapsing remitting multiple sclerosis  Since 2011 she noticed gradual onset slow worsening gait difficulty, she tends to drag her foot across the floor, stiff, has difficulty going upstairs, because of knee pain, she suffered a history of right ankle fracture in 2010, seems to dragging her right foot across the floor more, also complains urinary urgency, no bowel incontinence   MRI at [facility] in May 7th 2015, without contrast  MRI cervical spine (without) demonstrating at least 4 spinal cord T2 hyperintensities at cervico-medullary junction, C2, C3-4 and C6.  MRI brain (without) demonstrating multiple (> 20) round and ovoid, periventricular, callosal and peri-callosal, subcortical, juxtacortical, pontine and cerebellar T2 hyperintensities. Some of these are perpendicular to the lateral ventricles on axial and sagittal views. Some of these are hypointense on T1 views. Findings suspicious for demyelinating disease  CSF showed 5 oligoclonal banding, elevated IgG index   Extensive laboratory evaluations showed normal or negative CBC, CMP, Lyme titer, ANA, NMO, TSH, B12, folic acid, RPR. Positive varicellar zoster antibody  The visual evoked response test above was within normal limits on the left, with prolongation of the P100 latency on the right.   JC virus antibody was 0.15 negative in 10/18/2013  MRI of brain, and cervical spine with contrast, showed no contrast enhancement, there was no lesions noticed in MRI thoracic spine,  She started Plegridy since July 2015,  began full dose injection since Sept 15 2015. Complains of injection site reactio, lasting 2-4 weeks, she only has mild flu like illness n  She has fallen few times, more difficulty walking, when she  fell few weeks ago, Sep 15th 2015, she has difficulty getting up, could not move from waist down for 2-3 hours, also noticed slurring of her speech. She has mild fever then  She called Dr. Anne Hahn, had a repeat MRI of the brain Oct 2015, we have reviewed MRI together, continued evidence of periventricular white matter disease consistent with her diagnosis of multiple sclerosis, there was no change compared to previous image in  may 2015  She was started on prednisone  tapering since October 16,2015,  tolerating the medication well, seems to have mild improvement in her gait difficulty   She also complains of frequent headaches, consistent with migraines,   UPDATE Feb 2nd 2016:  Last visit Jan 19th 2016, this is [name] 9-21 visit, she continued to have intermittent headaches, Maxalt 5 mg works most of the time, but sometimes with severe prolonged headaches, repeat dose of Maxalt fail to improve her symptoms, sleep always helps  UPDATE March 3rd 2016: She complains of worsening headaches almost daily, moderate, tried maxalt.without significant improvement, she is taking baclofen ER , nortriptyline  qhs. Also with bilateral knee and hip pain, left worse than right, bilateral feet numbness, coldness sensation, which are chronic.  UPDATE May 25th 2016: She is overall doing very well, no significant bilateral lower extremity spasticity over the past few weeks, baseline urinary incontinence, gradually getting worse, accident almost daily basis, she was recently evaluated by her gynecologist, planning on to see a urologist soon   REVIEW OF SYSTEMS: Full 14 system review of systems performed and notable only for blurry vision, gait difficulties  ALLERGIES: Allergies  Allergen Reactions  . Sulfa Antibiotics Nausea And Vomiting    HOME MEDICATIONS: Outpatient  Prescriptions Prior to Visit  Medication Sig Dispense Refill  . baclofen (ED BACLOFEN) 10 MG tablet Take 1 tablet (10 mg total) by  mouth 3 (three) times daily. 90 each 11  . nortriptyline (PAMELOR) 10 MG capsule One po qhs xone week, then 2 tabs po qhs 60 capsule 11  . rizatriptan (MAXALT-MLT) 5 MG disintegrating tablet Take 1 tablet (5 mg total) by mouth as needed for migraine. May repeat in 2 hours if needed 15 tablet 11     PAST MEDICAL HISTORY: Past Medical History  Diagnosis Date  . Migraines   . Ankle fracture, right   . Knee joint dislocation     bilateral  . History of hysterectomy   . Multiple sclerosis     PAST SURGICAL HISTORY: Past Surgical History  Procedure Laterality Date  . Abdominal hysterectomy    . Myomectomy    . Cesarean section      FAMILY HISTORY: Family History  Problem Relation Age of Onset  . Diabetes Mother   . Osteoarthritis    . Heart disease    . Rheumatic fever    . Hypertension      SOCIAL HISTORY:  History   Social History  . Marital Status: Married    Spouse Name: [name]  . Number of Children: 1  . Years of Education: college   Occupational History  .      Does not work   Social History Main Topics  . Smoking status: Never Smoker   . Smokeless tobacco: Never Used  . Alcohol Use: 0.6 oz/week    1 Glasses of wine per week     Comment: On Holidays  . Drug Use: No  . Sexual Activity: Yes    Birth Control/ Protection: Surgical   Other Topics Concern  . Not on file   Social History Narrative   Patient is married. [name]).    Patient is unemployed.   Education- College education   Right handed.   Caffeine- None     PHYSICAL EXAM   There were no vitals filed for this visit. There is no weight on file to calculate BMI.   PHYSICAL EXAMNIATION:  Gen: NAD, conversant, well nourised, obese, well groomed                     Cardiovascular: Regular rate rhythm, no peripheral edema, warm, nontender. Eyes: Conjunctivae clear without exudates or hemorrhage Neck: Supple, no carotid bruise. Pulmonary: Clear to auscultation bilaterally   NEUROLOGICAL  EXAM:  MENTAL STATUS: Speech:    Speech is normal; fluent and spontaneous with normal comprehension.  Cognition:    The patient is oriented to person, place, and time;     recent and remote memory intact;     language fluent;     normal attention, concentration,     fund of knowledge.  CRANIAL NERVES: CN II: Visual fields are full to confrontation. Fundoscopic exam is normal with sharp discs and no vascular changes. Venous pulsations are present bilaterally. Pupils are 4 mm and briskly reactive to light. Visual acuity is 20/20 bilaterally. CN III, IV, VI: extraocular movement are normal. No ptosis. CN V: Facial sensation is intact to pinprick in all 3 divisions bilaterally. Corneal responses are intact.  CN VII: Face is symmetric with normal eye closure and smile. CN VIII: Hearing is normal to rubbing fingers CN IX, X: Palate elevates symmetrically. Phonation is normal. CN XI: Head turning and shoulder shrug are intact CN XII: Tongue  is midline with normal movements and no atrophy.  MOTOR: She has normal tone, mild left hip flexion, left ankle dorsiflexion weakness.  REFLEXES: Reflexes are 2+ and symmetric at the biceps, triceps, knees, and ankles. Plantar responses are flexor.  SENSORY: Light touch, pinprick, position sense, and vibration sense are intact in fingers and toes.  COORDINATION: Rapid alternating movements and fine finger movements are intact. There is no dysmetria on finger-to-nose and heel-knee-shin. There are no abnormal or extraneous movements.   GAIT/STANCE: Stiff, wide based, cautious gait  DIAGNOSTIC DATA (LABS, IMAGING, TESTING) - I reviewed patient records, labs, notes, testing and imaging myself where available.  ASSESSMENT AND PLAN   44 years old right-handed [place] American female, with relapsing remitting multiple sclerosis, also reported a history of migraine.  She is here for last [name] 9-21 visit, enrolled to 11-4 now,  tolerating Balofen  ER  daily well. Continue follow up according to protocol.    Levert Feinstein, M.D. Ph.D.  [facility] [address] [place], [place] [address] [phone]  Addendum: I have reviewed fax from [facility] dated October 10 2014, Aug 24 2014, July 11 2014 By dr. Merryl Hacker, patient had a history of hysterectomy for fibroid, chronic pelvic pain, patient was evaluated for left ovarian cyst and hydrocele of the index, no increasing overall size, the decision was to continue conservative management, add on Aygestin  daily. 4 (suppression, to try to avoid formation of further assistance in the interim, follow-up in 3 months,

## 2014-09-16 ENCOUNTER — Other Ambulatory Visit: Payer: Self-pay | Admitting: Neurology

## 2014-09-16 NOTE — Telephone Encounter (Signed)
Prescribed at [facility] on 07/16

## 2014-09-30 NOTE — Addendum Note (Signed)
Addended by: Levert Feinstein on: 09/30/2014 10:10 AM   Modules accepted: Orders, Medications

## 2014-10-06 ENCOUNTER — Encounter (INDEPENDENT_AMBULATORY_CARE_PROVIDER_SITE_OTHER): Payer: Self-pay

## 2014-10-06 DIAGNOSIS — Z0289 Encounter for other administrative examinations: Secondary | ICD-10-CM

## 2014-11-25 ENCOUNTER — Encounter (INDEPENDENT_AMBULATORY_CARE_PROVIDER_SITE_OTHER): Payer: Self-pay

## 2014-11-25 ENCOUNTER — Telehealth: Payer: Self-pay | Admitting: *Deleted

## 2014-11-25 DIAGNOSIS — L03114 Cellulitis of left upper limb: Secondary | ICD-10-CM

## 2014-11-25 DIAGNOSIS — Z0289 Encounter for other administrative examinations: Secondary | ICD-10-CM

## 2014-11-25 MED ORDER — CEPHALEXIN 500 MG PO CAPS: 500.0000 mg | ORAL_CAPSULE | Freq: Two times a day (BID) | ORAL | Status: DC

## 2014-11-25 NOTE — Telephone Encounter (Signed)
Pt. is in the office for a scheduled visit with the research dept.  She c/o redness, swelling, pain, itching to posterior aspect of left upper arm, onset one week ago after a Plegridy injection.  Site is very warm to touch, and she sts. sx. are worsening instead of getting better.  There is no drainage and she denies fever, aches, chills.  She is alert and oriented times 4 and is appropriate in conversation.  Dr. Frances Furbish visualized area of complaint and gave v/o for Keflex  po bid for 10 days.  I have escribed this rx. to [facility] per pt's request.  She has been instructed to go to urgent care if sx. worsen over the weekend.  She has been instructed to stop Keflex immed. if she has any rxn. to it.  She has been instructed not to use infected site for future injections until it is completely healed.  She has verbalized understanding of all of above instructions./fim

## 2014-11-25 NOTE — Telephone Encounter (Signed)
Patient of Dr. Zannie Cove with MS. Has been on Plegridy for about a year or so. Never had any similar reaction. On examination, patient is not ill appearing. She does have an area of redness, swelling and warmth in the left upper arm in the area of the triceps. She has no systemic symptoms. She has no rash elsewhere. She appears well enough to be treated for a local cellulitis as an outpatient. See details below. Patient is instructed to call us back if she has any other questions Week. If she feels that things worsen in the next 24 hours she is encouraged to go to urgent care or emergency room. Patient demonstrated understanding and agreement.

## 2014-11-28 ENCOUNTER — Telehealth: Payer: Self-pay | Admitting: Neurology

## 2014-11-28 NOTE — Telephone Encounter (Signed)
Dana Duke:  Would you please check on her arm

## 2014-11-28 NOTE — Telephone Encounter (Signed)
Spoke to [employer] - says her arm is healing and doing much better.

## 2014-12-02 ENCOUNTER — Telehealth: Payer: Self-pay

## 2014-12-02 NOTE — Telephone Encounter (Signed)
Patient called to inquiry about the arrival of the new study medicine for the [facility] trial that she is enrolled in. I told the patient that I will contact her as soon as we receive the medication. Patient agreed and voiced understanding.

## 2015-01-02 ENCOUNTER — Telehealth: Payer: Self-pay | Admitting: Neurology

## 2015-01-02 NOTE — Telephone Encounter (Signed)
Patient called to request something in writing confirming or copy of medical record that shows she has MS. She needs this for [employer].

## 2015-01-02 NOTE — Telephone Encounter (Signed)
Per Irving Burton, patient has already picked up her medical records.

## 2015-01-30 ENCOUNTER — Telehealth: Payer: Self-pay

## 2015-01-30 DIAGNOSIS — G35 Multiple sclerosis: Secondary | ICD-10-CM

## 2015-01-30 NOTE — Telephone Encounter (Signed)
The patient came to the office today, 17OCT2016, because she thought that her [facility] [place] visit was today, but it is in fact scheduled for 17NOV2016. Patient expressed her desire to see Dr. Terrace Arabia, because on 03OCT2016 she was "seeing brown for approximately 5 minutes in both eyes." The patient stated that this happened early in the morning, and that she was also having a headache at the time. I told the patient that I will pass the message to Dr. Terrace Arabia, and that the clinic will contact her about this.

## 2015-01-30 NOTE — Telephone Encounter (Signed)
I have called, and talked with patient, January 16 2015, she woke up with moderate severe migraine headaches, one hour later, during intense headache, she also noticed visual distortion, seeing brown in her visual field, blocking her visual field, lasting for a few minutes, she is now back to her baseline,  Last MRI was October 2015, I will repeat MRI of the brain and cervical spine with and without contrast, order was placed

## 2015-03-01 ENCOUNTER — Ambulatory Visit (INDEPENDENT_AMBULATORY_CARE_PROVIDER_SITE_OTHER): Payer: 59

## 2015-03-01 DIAGNOSIS — G35 Multiple sclerosis: Secondary | ICD-10-CM | POA: Diagnosis not present

## 2015-03-01 MED ORDER — GADOPENTETATE DIMEGLUMINE 469.01 MG/ML IV SOLN: 20.0000 mL | Freq: Once | INTRAVENOUS | Status: AC | PRN

## 2015-03-02 ENCOUNTER — Encounter (INDEPENDENT_AMBULATORY_CARE_PROVIDER_SITE_OTHER): Payer: Self-pay | Admitting: Neurology

## 2015-03-02 DIAGNOSIS — Z0289 Encounter for other administrative examinations: Secondary | ICD-10-CM

## 2015-03-07 ENCOUNTER — Telehealth: Payer: Self-pay | Admitting: Neurology

## 2015-03-07 NOTE — Telephone Encounter (Signed)
I called the number provided by patient. It was for [facility].  Spoke with [employer].  She said they have to get an approval from [employer] before they can send a replacement order.  I called [employer] at [phone].  Spoke with Marissa.  She said they have placed the request in the system, however, it takes 24 hours to process, so [facility] would receive it tomorrow, and should contact the patient to schedule delivery at that time.  Said she would still be okay to use med as long as she hasn't missed 2 doses.  I called the patient back.  Relayed info provided by [employer] and [employer].  Patient expressed understanding.  Says she has been taking all doses on time.  She will call us back if anything further is needed.

## 2015-03-07 NOTE — Telephone Encounter (Signed)
Follow up appt has been scheduled

## 2015-03-07 NOTE — Telephone Encounter (Signed)
I have called her, compared MRI brain, cervical in 2016 to 2015, she has significant lesion load in cervical spine, brainstem, supratentorium regions,  Even though there was no significant change on MRIs she complains of episode of blurry vision, suggestive of MS flareups,  She is JC virus negative,  Dana Duke, please give her a follow-up appointment, we will discuss treatment options, potential Tysarbri switch,,

## 2015-03-07 NOTE — Telephone Encounter (Signed)
Pt called and states that she used Peginterferon Beta-1a (PLEGRIDY [place] and it did not work. The needle did not come out of the pin and so she has not been able to have her medication. Her pharmacy told her to call our office and have another Rx sent to the pharm. Please call [phone] - pharm. #. Lot # [id number],  Expiration  date 06/17. Pt would like to know what might happen if she is to miss a dose. Please call and advise [phone]

## 2015-03-28 ENCOUNTER — Encounter: Payer: Self-pay | Admitting: Neurology

## 2015-03-28 ENCOUNTER — Ambulatory Visit (INDEPENDENT_AMBULATORY_CARE_PROVIDER_SITE_OTHER): Payer: 59 | Admitting: Neurology

## 2015-03-28 VITALS — BP 116/75 | HR 84 | Ht 66.0 in | Wt 185.0 lb

## 2015-03-28 DIAGNOSIS — G35 Multiple sclerosis: Secondary | ICD-10-CM | POA: Diagnosis not present

## 2015-03-28 DIAGNOSIS — R269 Unspecified abnormalities of gait and mobility: Secondary | ICD-10-CM | POA: Diagnosis not present

## 2015-03-28 DIAGNOSIS — G43009 Migraine without aura, not intractable, without status migrainosus: Secondary | ICD-10-CM | POA: Diagnosis not present

## 2015-03-28 NOTE — Progress Notes (Signed)
[facility]  PATIENT: Dana Duke DOB: [birth year]  HISTORICAL (initial visit was in Nov 2014) Dana Duke is a 44 years old right-handed African American female, referred by Dana Duke, Dana Duke, she has relapsing remitting multiple sclerosis  Since 2011 she noticed gradual onset slow worsening gait difficulty, she tends to drag her foot across the floor, stiff, has difficulty going upstairs, because of knee pain, she suffered a history of right ankle fracture in 2010, seems to dragging her right foot across the floor more, also complains urinary urgency, no bowel incontinence   MRI at [facility] in May 7th 2015, without contrast  MRI cervical spine (without) demonstrating at least 4 spinal cord T2 hyperintensities at cervico-medullary junction, C2, C3-4 and C6.  MRI brain (without) demonstrating multiple (> 20) round and ovoid, periventricular, callosal and peri-callosal, subcortical, juxtacortical, pontine and cerebellar T2 hyperintensities. Some of these are perpendicular to the lateral ventricles on axial and sagittal views. Some of these are hypointense on T1 views. Findings suspicious for demyelinating disease  CSF showed 5 oligoclonal banding, elevated IgG index   Extensive laboratory evaluations showed normal or negative CBC, CMP, Lyme titer, ANA, NMO, TSH, B12, folic acid, RPR. Positive varicellar zoster antibody  The visual evoked response test above was within normal limits on the left, with prolongation of the P100 latency on the right.   JC virus antibody was 0.15 negative in 10/18/2013  MRI of brain, and cervical spine with contrast, showed no contrast enhancement, there was no lesions noticed in MRI thoracic spine,  She started Plegridy since July 2015,  began full dose injection since Sept 15 2015. Complains of injection site reactio, lasting 2-4 weeks, she only has mild flu like illness   She has fallen few times, more difficulty walking, when she  fell few weeks ago, Sep 15th 2015, she has difficulty getting up, could not move from waist down for 2-3 hours, also noticed slurring of her speech. She has mild fever then  She called Dana Duke, had a repeat MRI of the brain Oct 2015, we have reviewed MRI together, continued evidence of periventricular white matter disease consistent with her diagnosis of multiple sclerosis, there was no change compared to previous image in  may 2015  She was started on prednisone  tapering since October 16,2015,  tolerating the medication well, seems to have mild improvement in her gait difficulty   She also complains of frequent headaches, consistent with migraines,   UPDATE Feb 2nd 2016:  Last visit Jan 19th 2016, this is [name] 9-21 visit, she continued to have intermittent headaches, Maxalt 5 mg works most of the time, but sometimes with severe prolonged headaches, repeat dose of Maxalt fail to improve her symptoms, sleep always helps  UPDATE March 3rd 2016: She complains of worsening headaches almost daily, moderate, tried maxalt.without significant improvement, she is taking baclofen ER , nortriptyline  qhs. Also with bilateral knee and hip pain, left worse than right, bilateral feet numbness, coldness sensation, which are chronic.  UPDATE May 25th 2016: She is overall doing very well, no significant bilateral lower extremity spasticity over the past few weeks, baseline urinary incontinence, gradually getting worse, accident almost daily basis, she was recently evaluated by her gynecologist, planning on to see a urologist soon   UPDATE Mar 28 2015: She is with her husband daughter at today's clinical visit, we have reviewed previous MS history, she was started on Plegridy in July 2015, had a flareup in Oct 2015, was treated with  steroid, over the past 18 months, she had slow worsening gait difficulty  We have reviewed MRI of the brain with and without contrast in November 2016:  abnormal MRI  of the brain with and without contrast showing T2/FLAIR hyperintense foci at the cervicomedullary junction and within the pons, midbrain and cerebellum in the posterior fossa. Additionally, there are many T2/FLAIR hyperintense foci in the cerebral hemispheres, predominantly in the periventricular white matter. The foci are consistent with chronic demyelinating plaques from multiple sclerosis. None of the foci appears to be acute and there are no enhancing lesions.   abnormal MRI of the cervical spine showing T2 hyperintense foci within the spinal cord at the cervicomedullary junction, C2, C3, C5C6 and C6 as detailed above. Additional foci are noted within the pons. None of the foci appears to be acute. No significant degenerative changes are noted. The lesions are consistent with chronic demyelinating plaques as would be seen in multiple sclerosis. The findings are similar to the MRI report from 08/19/2013.  She still has significant better urgency, no bowel incontinence, taking baclofen ER for bilateral lower extremity spasticity,  Migraine headaches: Overall under good control she is taking nortriptyline 20 mg every night, taking Topamax as needed instead of regular 100 mg twice a day Imitrex as needed was helpful   REVIEW OF SYSTEMS: Full 14 system review of systems performed and notable only for chills, fatigue, cold intolerance, excessive thirst, daytime sleepiness, bladder incontinence, joint pain, walking difficulty, headaches  ALLERGIES: Allergies  Allergen Reactions  . Sulfa Antibiotics Nausea And Vomiting    HOME MEDICATIONS: Outpatient Prescriptions Prior to Visit  Medication Sig Dispense Refill  . baclofen (ED BACLOFEN) 10 MG tablet Take 1 tablet (10 mg total) by mouth 3 (three) times daily. 90 each 11  . nortriptyline (PAMELOR) 10 MG capsule One po qhs xone week, then 2 tabs po qhs 60 capsule 11  . rizatriptan (MAXALT-MLT) 5 MG disintegrating tablet Take 1 tablet (5 mg  total) by mouth as needed for migraine. May repeat in 2 hours if needed 15 tablet 11     PAST MEDICAL HISTORY: Past Medical History  Diagnosis Date  . Migraines   . Ankle fracture, right   . Knee joint dislocation     bilateral  . History of hysterectomy   . Multiple sclerosis (HCC)     PAST SURGICAL HISTORY: Past Surgical History  Procedure Laterality Date  . Abdominal hysterectomy    . Myomectomy    . Cesarean section      FAMILY HISTORY: Family History  Problem Relation Age of Onset  . Diabetes Mother   . Osteoarthritis    . Heart disease    . Rheumatic fever    . Hypertension      SOCIAL HISTORY:  Social History   Social History  . Marital Status: Married    Spouse Name: [name]  . Number of Children: 1  . Years of Education: college   Occupational History  .      Does not work   Social History Main Topics  . Smoking status: Never Smoker   . Smokeless tobacco: Never Used  . Alcohol Use: 0.6 oz/week    1 Glasses of wine per week     Comment: On Holidays  . Drug Use: No  . Sexual Activity: Yes    Birth Control/ Protection: Surgical   Other Topics Concern  . Not on file   Social History Narrative   Patient is married. [name]).  Patient is unemployed.   Education- College education   Right handed.   Caffeine- None     PHYSICAL EXAM   Filed Vitals:   03/28/15 1125  BP: 116/75  Pulse: 84  Height: 5\' 6"  (1.676 m)  Weight: 185 lb (83.915 kg)   Body mass index is 29.87 kg/(m^2).   PHYSICAL EXAMNIATION:  Gen: NAD, conversant, well nourised, obese, well groomed                     Cardiovascular: Regular rate rhythm, no peripheral edema, warm, nontender. Eyes: Conjunctivae clear without exudates or hemorrhage Neck: Supple, no carotid bruise. Pulmonary: Clear to auscultation bilaterally   NEUROLOGICAL EXAM:  MENTAL STATUS: Speech:    Speech is normal; fluent and spontaneous with normal comprehension.  Cognition:    The patient is  oriented to person, place, and time;     recent and remote memory intact;     language fluent;     normal attention, concentration,     fund of knowledge.  CRANIAL NERVES: CN II: Visual fields are full to confrontation. Fundoscopic exam is normal with sharp discs and no vascular changes. Venous pulsations are present bilaterally. Pupils are 4 mm and briskly reactive to light. Visual acuity is 20/20 bilaterally. CN III, IV, VI: extraocular movement are normal. No ptosis. CN V: Facial sensation is intact to pinprick in all 3 divisions bilaterally. Corneal responses are intact.  CN VII: Face is symmetric with normal eye closure and smile. CN VIII: Hearing is normal to rubbing fingers CN IX, X: Palate elevates symmetrically. Phonation is normal. CN XI: Head turning and shoulder shrug are intact CN XII: Tongue is midline with normal movements and no atrophy.  MOTOR: She has normal tone, mild left hip flexion, left ankle dorsiflexion weakness.  REFLEXES: Reflexes are 2+ and symmetric at the biceps, triceps, knees, and ankles. Plantar responses are flexor.  SENSORY: Light touch, pinprick, position sense, and vibration sense are intact in fingers and toes.  COORDINATION: Rapid alternating movements and fine finger movements are intact. There is no dysmetria on finger-to-nose and heel-knee-shin. There are no abnormal or extraneous movements.   GAIT/STANCE: Stiff, wide based, cautious gait, dragging left leg more  DIAGNOSTIC DATA (LABS, IMAGING, TESTING) - I reviewed patient records, labs, notes, testing and imaging myself where available. JC virus was negative in July 2015, with titer of 0.15  ASSESSMENT AND PLAN   44 years old right-handed [place] American female, with relapsing remitting multiple sclerosis, also reported a history of migraine.  Relapsing remitting multiple sclerosis  After extensive discussion with patient and her family, we decided to switch her toTysarbri   Repeat  laboratory evaluations including JC virus antibody  Chronic migraine  Continue Topamax 100 mg twice a day, nortriptyline 20 mg every night as preventative medications  Imitrex as needed for abortive treatment    Bilateral lower extremity spasticity  She is taking Balofen ER 30mg  daily    Levert Feinstein, M.D. Ph.D.  [facility] [address] [place], [place] [address] [phone]

## 2015-03-29 ENCOUNTER — Telehealth: Payer: Self-pay | Admitting: *Deleted

## 2015-03-29 LAB — CBC WITH DIFFERENTIAL/PLATELET
BASOS ABS: 0 10*3/uL (ref 0.0–0.2)
BASOS: 0 %
EOS (ABSOLUTE): 0 10*3/uL (ref 0.0–0.4)
Eos: 0 %
HEMOGLOBIN: 12.7 g/dL (ref 11.1–15.9)
Hematocrit: 39.1 % (ref 34.0–46.6)
IMMATURE GRANS (ABS): 0 10*3/uL (ref 0.0–0.1)
Immature Granulocytes: 0 %
LYMPHS ABS: 1.4 10*3/uL (ref 0.7–3.1)
LYMPHS: 36 %
MCH: 27.7 pg (ref 26.6–33.0)
MCHC: 32.5 g/dL (ref 31.5–35.7)
MCV: 85 fL (ref 79–97)
MONOCYTES: 10 %
Monocytes Absolute: 0.4 10*3/uL (ref 0.1–0.9)
NEUTROS ABS: 2.1 10*3/uL (ref 1.4–7.0)
Neutrophils: 54 %
Platelets: 272 10*3/uL (ref 150–379)
RBC: 4.59 x10E6/uL (ref 3.77–5.28)
RDW: 15.7 % — ABNORMAL HIGH (ref 12.3–15.4)
WBC: 3.9 10*3/uL (ref 3.4–10.8)

## 2015-03-29 LAB — COMPREHENSIVE METABOLIC PANEL
A/G RATIO: 1.4 (ref 1.1–2.5)
ALBUMIN: 4.2 g/dL (ref 3.5–5.5)
ALK PHOS: 42 IU/L (ref 39–117)
ALT: 26 IU/L (ref 0–32)
AST: 26 IU/L (ref 0–40)
BILIRUBIN TOTAL: 0.3 mg/dL (ref 0.0–1.2)
BUN / CREAT RATIO: 9 (ref 9–23)
BUN: 7 mg/dL (ref 6–24)
CHLORIDE: 104 mmol/L (ref 96–106)
CO2: 23 mmol/L (ref 18–29)
Calcium: 9.2 mg/dL (ref 8.7–10.2)
Creatinine, Ser: 0.77 mg/dL (ref 0.57–1.00)
GFR calc non Af Amer: 93 mL/min/{1.73_m2} (ref >59)
GFR, EST AFRICAN AMERICAN: 107 mL/min/{1.73_m2} (ref >59)
Globulin, Total: 3.1 g/dL (ref 1.5–4.5)
Glucose: 76 mg/dL (ref 65–99)
POTASSIUM: 4.2 mmol/L (ref 3.5–5.2)
SODIUM: 140 mmol/L (ref 134–144)
TOTAL PROTEIN: 7.3 g/dL (ref 6.0–8.5)

## 2015-03-29 LAB — TSH: TSH: 0.613 u[IU]/mL (ref 0.450–4.500)

## 2015-03-29 NOTE — Telephone Encounter (Signed)
I spoke to pt and relayed that her lab work was basically normal, still pending was the JCV and we will call her when that result comes back.  She verbalized understanding.

## 2015-03-29 NOTE — Telephone Encounter (Signed)
-----   Message from Levert Feinstein, MD sent at 03/29/2015  9:08 AM EST ----- Please call patient for essentially normal laboratory evaluations, JC virus titer is pending

## 2015-04-04 ENCOUNTER — Telehealth: Payer: Self-pay | Admitting: *Deleted

## 2015-04-04 NOTE — Telephone Encounter (Signed)
Labs collected 03/28/15:  JCV Antibody 0.19 negative

## 2015-04-13 NOTE — Telephone Encounter (Signed)
Patient is calling to discuss JCV results.

## 2015-04-13 NOTE — Telephone Encounter (Signed)
Spoke to pt and advised her that her JCV was negative. Pt verbalized understanding.

## 2015-04-13 NOTE — Telephone Encounter (Signed)
Pt returned [name]'s call °

## 2015-04-13 NOTE — Telephone Encounter (Signed)
Returned pt's call. No answer, left a message asking that she call me back.

## 2015-04-26 ENCOUNTER — Telehealth: Payer: Self-pay | Admitting: Neurology

## 2015-04-26 NOTE — Telephone Encounter (Signed)
error 

## 2015-04-27 ENCOUNTER — Telehealth: Payer: Self-pay | Admitting: Neurology

## 2015-04-27 NOTE — Telephone Encounter (Signed)
I called the patient. Dana Duke, infusion RN, sent prior authorization yesterday STAT. She is hoping to hear back today or tomorrow. The patient's insurance company has changed since the beginning of the year, which has placed a delay on the approval process. I explained that as soon as we receive approval, Dana Duke will call her to schedule infusion. She verbalized understanding.

## 2015-04-27 NOTE — Telephone Encounter (Signed)
Patient called requesting to speak to someone regarding Tysabri. She "needs clarity, states she is in total confusion, wants to know if she is going to get an appointment for infusion, [employer] keeps calling her wanting to know why she hasn't had her infusion. The infusion nurse keeps telling her she will call her".

## 2015-05-03 NOTE — Telephone Encounter (Signed)
Dana Duke with [facility] called. She stated that we cannot put "urgent" on Tysabri PA. If we need it quickly we can put "STAT" on the PA, or we can call her at [phone].

## 2015-05-08 ENCOUNTER — Telehealth: Payer: Self-pay | Admitting: Neurology

## 2015-05-08 NOTE — Telephone Encounter (Signed)
Patient is no longer on Plegridy.  She needs to reschedule her first Tysabri infusion for 05/09/15.  She will be coming in at 2pm.  She did not have any further questions or concerns.

## 2015-05-08 NOTE — Telephone Encounter (Signed)
Pt called and states she fell last Wednesday and could not get up. She says she is coming in today for an infusion. She wants to know if it is ok to come in when she received and starting her Peginterferon Beta-1a (PLEGRIDY [place] on Tuesday While speaking with the pt she had a hard time coming up with the correct words and the correct way to say them. May call the pt at [phone]

## 2015-05-26 ENCOUNTER — Telehealth: Payer: Self-pay | Admitting: Neurology

## 2015-05-26 NOTE — Telephone Encounter (Signed)
I spoke with Denna Haggard.  I reminded her of her research appointment.  Kirbi asked if she would be seeing Dr. Terrace Arabia during this appointment.  I stated that she would not.  She asked if she could see Dr. Terrace Arabia for this appointment.  I told her that I would speak with the coordinator as well as Dr. Terrace Arabia.

## 2015-05-29 ENCOUNTER — Encounter (INDEPENDENT_AMBULATORY_CARE_PROVIDER_SITE_OTHER): Payer: Self-pay

## 2015-05-29 ENCOUNTER — Telehealth: Payer: Self-pay

## 2015-05-29 ENCOUNTER — Telehealth: Payer: Self-pay | Admitting: Neurology

## 2015-05-29 DIAGNOSIS — Z0289 Encounter for other administrative examinations: Secondary | ICD-10-CM

## 2015-05-29 NOTE — Telephone Encounter (Signed)
I spoke to the patient in regards to the [facility] study. I asked her to please bring back the medication that was dispensed today plus the one at home. I told her that I would dispense more medication today.

## 2015-05-29 NOTE — Telephone Encounter (Signed)
She started [place] infusion May 09 2015, tolerate the infusion well, there was no significant side effect,  She reported less fatigue, less headaches, less bilateral lower extremity spasticity,  Will decrease baclofen ER from 40 to 30 mg daily,

## 2015-05-31 ENCOUNTER — Telehealth: Payer: Self-pay | Admitting: Neurology

## 2015-05-31 NOTE — Telephone Encounter (Signed)
I spoke with Dana Duke.  I stated that my reason for calling was to schedule her next research appointment visit.  [name] requested that she be seen early in the morning.  I scheduled her for Monday, May 15th at 8:30am.

## 2015-06-08 ENCOUNTER — Telehealth: Payer: Self-pay | Admitting: Neurology

## 2015-06-08 NOTE — Telephone Encounter (Signed)
I spoke with Dana Duke.  I explained my reason for calling was to schedule her for an "Unscheduled" visit for the research study.  I explained that Dana Duke would like for her to come around 12:45am and or 1pm.  Dana Duke stated that she did not realize that she was taking the higher dosage and wanted to call me back and think about the dose change.

## 2015-06-08 NOTE — Telephone Encounter (Signed)
I spoke with Dana Duke.  She wanted to know if she could come on a Friday.  I said that she would need to see Dr. Terrace Arabia and therefore, Fridays would not work.  She asked again if she really needed to see Dr. Terrace Arabia as Dr. Terrace Arabia has already approved her dose change.  I reiterated that she still needed to see the doctor.  She has requested that [name] call her back and confirm that she needs to see a doctor.  She said if she needs to see Dr. Terrace Arabia, then the current appointment slot will work.  She said however if she does not need to see Dr. Terrace Arabia, she would like to come on a Friday.

## 2015-06-09 ENCOUNTER — Encounter (INDEPENDENT_AMBULATORY_CARE_PROVIDER_SITE_OTHER): Payer: Self-pay

## 2015-06-09 DIAGNOSIS — Z0289 Encounter for other administrative examinations: Secondary | ICD-10-CM

## 2015-06-29 ENCOUNTER — Ambulatory Visit (INDEPENDENT_AMBULATORY_CARE_PROVIDER_SITE_OTHER): Payer: BLUE CROSS/BLUE SHIELD | Admitting: Neurology

## 2015-06-29 ENCOUNTER — Encounter: Payer: Self-pay | Admitting: Neurology

## 2015-06-29 VITALS — BP 112/75 | HR 73 | Ht 66.0 in | Wt 175.0 lb

## 2015-06-29 DIAGNOSIS — G43009 Migraine without aura, not intractable, without status migrainosus: Secondary | ICD-10-CM | POA: Diagnosis not present

## 2015-06-29 DIAGNOSIS — R258 Other abnormal involuntary movements: Secondary | ICD-10-CM

## 2015-06-29 DIAGNOSIS — G35 Multiple sclerosis: Secondary | ICD-10-CM | POA: Diagnosis not present

## 2015-06-29 DIAGNOSIS — R252 Cramp and spasm: Secondary | ICD-10-CM | POA: Insufficient documentation

## 2015-06-29 MED ORDER — NORTRIPTYLINE HCL 10 MG PO CAPS: 20.0000 mg | ORAL_CAPSULE | Freq: Every day | ORAL | Status: DC

## 2015-06-29 NOTE — Progress Notes (Signed)
Chief Complaint  Patient presents with  . Multiple Sclerosis    She is here with her daughter, Dana Duke.  Says she is tolerating Tysabri well. She continues to take Baclofen ER 30mg  daily for her spasticity.  . Migraine    Feels her migraines are well controlled on Topamax.  Sumatriptan resolves her more severe migraines but she rarely has to use it.   [facility]  PATIENT: Dana Duke DOB: [birth year]  HISTORICAL (initial visit was in Nov 2014) Dana Duke is a 45 years old right-handed African American female, referred by Dr. Suann Duke, Dana Duke, she has relapsing remitting multiple sclerosis  Since 2011 she noticed gradual onset slow worsening gait difficulty, she tends to drag her foot across the floor, stiff, has difficulty going upstairs, because of knee pain, she suffered a history of right ankle fracture in 2010, seems to dragging her right foot across the floor more, also complains urinary urgency, no bowel incontinence   MRI at [facility] in May 7th 2015, without contrast  MRI cervical spine (without) demonstrating at least 4 spinal cord T2 hyperintensities at cervico-medullary junction, C2, C3-4 and C6.  MRI brain (without) demonstrating multiple (> 20) round and ovoid, periventricular, callosal and peri-callosal, subcortical, juxtacortical, pontine and cerebellar T2 hyperintensities. Some of these are perpendicular to the lateral ventricles on axial and sagittal views. Some of these are hypointense on T1 views. Findings suspicious for demyelinating disease  CSF showed 5 oligoclonal banding, elevated IgG index   Extensive laboratory evaluations showed normal or negative CBC, CMP, Lyme titer, ANA, NMO, TSH, B12, folic acid, RPR. Positive varicellar zoster antibody  The visual evoked response test above was within normal limits on the left, with prolongation of the P100 latency on the right.   JC virus antibody was 0.15 negative in 10/18/2013  MRI of brain, and  cervical spine with contrast, showed no contrast enhancement, there was no lesions noticed in MRI thoracic spine,  She started Plegridy since July 2015,  began full dose injection since Sept 15 2015. Complains of injection site reactio, lasting 2-4 weeks, she only has mild flu like illness   She has fallen few times, more difficulty walking, when she fell few weeks ago, Sep 15th 2015, she has difficulty getting up, could not move from waist down for 2-3 hours, also noticed slurring of her speech. She has mild fever then  She called Dr. Anne Hahn, had a repeat MRI of the brain Oct 2015, we have reviewed MRI together, continued evidence of periventricular white matter disease consistent with her diagnosis of multiple sclerosis, there was no change compared to previous image in  may 2015  She was started on prednisone  tapering since October 16,2015,  tolerating the medication well, seems to have mild improvement in her gait difficulty   She also complains of frequent headaches, consistent with migraines,   UPDATE Feb 2nd 2016:  Last visit Jan 19th 2016, this is [name] 9-21 visit, she continued to have intermittent headaches, Maxalt 5 mg works most of the time, but sometimes with severe prolonged headaches, repeat dose of Maxalt fail to improve her symptoms, sleep always helps  UPDATE March 3rd 2016: She complains of worsening headaches almost daily, moderate, tried maxalt.without significant improvement, she is taking baclofen ER 30mg , nortriptyline 20mg  qhs. Also with bilateral knee and hip pain, left worse than right, bilateral feet numbness, coldness sensation, which are chronic.  UPDATE May 25th 2016: She is overall doing very well, no significant bilateral lower extremity spasticity  over the past few weeks, baseline urinary incontinence, gradually getting worse, accident almost daily basis, she was recently evaluated by her gynecologist, planning on to see a urologist soon   UPDATE Mar 28 2015: She is with her husband daughter at today's clinical visit, we have reviewed previous MS history, she was started on Plegridy in July 2015, had a flareup in Oct 2015, was treated with steroid, over the past 18 months, she had slow worsening gait difficulty  We have reviewed MRI of the brain with and without contrast in November 2016:  abnormal MRI of the brain with and without contrast showing T2/FLAIR hyperintense foci at the cervicomedullary junction and within the pons, midbrain and cerebellum in the posterior fossa. Additionally, there are many T2/FLAIR hyperintense foci in the cerebral hemispheres, predominantly in the periventricular white matter. The foci are consistent with chronic demyelinating plaques from multiple sclerosis. None of the foci appears to be acute and there are no enhancing lesions.   abnormal MRI of the cervical spine showing T2 hyperintense foci within the spinal cord at the cervicomedullary junction, C2, C3, C5C6 and C6 as detailed above. Additional foci are noted within the pons. None of the foci appears to be acute. No significant degenerative changes are noted. The lesions are consistent with chronic demyelinating plaques as would be seen in multiple sclerosis. The findings are similar to the MRI report from 08/19/2013.  She still has significant better urgency, no bowel incontinence, taking baclofen ER for bilateral lower extremity spasticity,  Migraine headaches: Overall under good control she is taking nortriptyline 20 mg every night, taking Topamax as needed instead of regular 100 mg twice a day Imitrex as needed was helpful  UPDATE June 29 2015: He started [place] infusion since January 2017, she tolerated well, she has much less frequent headaches, Maxalt continued to help, will taper off Topamax, leave her on nortriptyline 20 mg every night, her bilateral lower extremity spasticity has much improved as well, on lower dose of baclofen ER 30 mg  daily,   REVIEW OF SYSTEMS: Full 14 system review of systems performed and notable only for chills, fatigue, cold intolerance, excessive thirst, daytime sleepiness, bladder incontinence, joint pain, walking difficulty, headaches  ALLERGIES: Allergies  Allergen Reactions  . Sulfa Antibiotics Nausea And Vomiting    HOME MEDICATIONS: Outpatient Prescriptions Prior to Visit  Medication Sig Dispense Refill  . baclofen (ED BACLOFEN) 10 MG tablet Take 1 tablet (10 mg total) by mouth 3 (three) times daily. 90 each 11  . nortriptyline (PAMELOR) 10 MG capsule One po qhs xone week, then 2 tabs po qhs 60 capsule 11  . rizatriptan (MAXALT-MLT) 5 MG disintegrating tablet Take 1 tablet (5 mg total) by mouth as needed for migraine. May repeat in 2 hours if needed 15 tablet 11     PAST MEDICAL HISTORY: Past Medical History  Diagnosis Date  . Migraines   . Ankle fracture, right   . Knee joint dislocation     bilateral  . History of hysterectomy   . Multiple sclerosis (HCC)     PAST SURGICAL HISTORY: Past Surgical History  Procedure Laterality Date  . Abdominal hysterectomy    . Myomectomy    . Cesarean section      FAMILY HISTORY: Family History  Problem Relation Age of Onset  . Diabetes Mother   . Osteoarthritis    . Heart disease    . Rheumatic fever    . Hypertension      SOCIAL HISTORY:  Social History   Social History  . Marital Status: Married    Spouse Name: [name]  . Number of Children: 1  . Years of Education: college   Occupational History  .      Does not work   Social History Main Topics  . Smoking status: Never Smoker   . Smokeless tobacco: Never Used  . Alcohol Use: 0.6 oz/week    1 Glasses of wine per week     Comment: On Holidays  . Drug Use: No  . Sexual Activity: Yes    Birth Control/ Protection: Surgical   Other Topics Concern  . Not on file   Social History Narrative   Patient is married. [name]).    Patient is unemployed.   Education-  College education   Right handed.   Caffeine- None     PHYSICAL EXAM   Filed Vitals:   06/29/15 1216  BP: 112/75  Pulse: 73  Height: 5\' 6"  (1.676 m)  Weight: 175 lb (79.379 kg)   Body mass index is 28.26 kg/(m^2).   PHYSICAL EXAMNIATION:  Gen: NAD, conversant, well nourised, obese, well groomed                     Cardiovascular: Regular rate rhythm, no peripheral edema, warm, nontender. Eyes: Conjunctivae clear without exudates or hemorrhage Neck: Supple, no carotid bruise. Pulmonary: Clear to auscultation bilaterally   NEUROLOGICAL EXAM:  MENTAL STATUS: Speech:    Speech is normal; fluent and spontaneous with normal comprehension.  Cognition:    The patient is oriented to person, place, and time;     recent and remote memory intact;     language fluent;     normal attention, concentration,     fund of knowledge.  CRANIAL NERVES: CN II: Visual fields are full to confrontation. Fundoscopic exam is normal with sharp discs and no vascular changes. Venous pulsations are present bilaterally. Pupils are 4 mm and briskly reactive to light. Visual acuity is 20/20 bilaterally. CN III, IV, VI: extraocular movement are normal. No ptosis. CN V: Facial sensation is intact to pinprick in all 3 divisions bilaterally. Corneal responses are intact.  CN VII: Face is symmetric with normal eye closure and smile. CN VIII: Hearing is normal to rubbing fingers CN IX, X: Palate elevates symmetrically. Phonation is normal. CN XI: Head turning and shoulder shrug are intact CN XII: Tongue is midline with normal movements and no atrophy.  MOTOR: She has normal tone, mild left hip flexion, left ankle dorsiflexion weakness.  REFLEXES: Reflexes are 2+ and symmetric at the biceps, triceps, knees, and ankles. Plantar responses are flexor.  SENSORY: Light touch, pinprick, position sense, and vibration sense are intact in fingers and toes.  COORDINATION: Rapid alternating movements and fine  finger movements are intact. There is no dysmetria on finger-to-nose and heel-knee-shin. There are no abnormal or extraneous movements.   GAIT/STANCE: Stiff, wide based, cautious gait, dragging left leg more  DIAGNOSTIC DATA (LABS, IMAGING, TESTING) - I reviewed patient records, labs, notes, testing and imaging myself where available. JC virus was negative in July 2015, with titer of 0.15, negative March 28 2015  Tysarbri infusion since January 20 fourth 2017 Most recent MRI of the brain with and without contrast in November 2016: T2/FLAIR hyperintense foci at the cervicomedullary junction and within the pons, midbrain and cerebellum in the posterior fossa. Additionally, there are many T2/FLAIR hyperintense foci in the cerebral hemispheres, predominantly in the periventricular white matter. The foci  are consistent with chronic demyelinating plaques from multiple sclerosis. None of the foci appears to be acute and there are no enhancing lesions.  MRI of cervical spine with and without contrast November 2016: T2 hyperintense foci within the spinal cord at the cervicomedullary junction, C2, C3, C5 and C6. Additional foci are noted within the pons. None of the foci appears to be acute. No significant degenerative changes are noted. The lesions are consistent with chronic demyelinating plaques as would be seen in multiple sclerosis. The findings are similar to the MRI report from 08/19/2013.  ASSESSMENT AND PLAN   44 years old right-handed [place] American female, with relapsing remitting multiple sclerosis, also reported a history of migraine.  Relapsing remitting multiple sclerosis  Silvestre Moment since May 09 2015  Negative JC virus antibody  Chronic migraine  Tapering off Topamax, continue nortriptyline 20 mg every night as preventative medications  Imitrex as needed for abortive treatment    Bilateral lower extremity spasticity  She is taking Balofen ER  daily    Levert Feinstein,  M.D. Ph.D.  [facility] [address], [address] [place], [place] [address] [phone]

## 2015-06-29 NOTE — Patient Instructions (Addendum)
Take Topamax 100 mg every night for one week, then stop  Continue nortriptyline 10 mg 2 tablets every night as migraine prevention

## 2015-07-01 ENCOUNTER — Other Ambulatory Visit: Payer: Self-pay | Admitting: Neurology

## 2015-08-01 ENCOUNTER — Encounter: Payer: Self-pay | Admitting: *Deleted

## 2015-08-07 ENCOUNTER — Telehealth: Payer: Self-pay | Admitting: Neurology

## 2015-08-07 ENCOUNTER — Encounter: Payer: Self-pay | Admitting: *Deleted

## 2015-08-07 NOTE — Telephone Encounter (Signed)
Patient called requesting to speak to nurse Marcelino Duster regarding letter she received from our office, wants to get clarification.

## 2015-08-07 NOTE — Telephone Encounter (Signed)
She is requiring a letter excusing her from jury duty. Letter placed up front and she will have her husband come pick it up.

## 2015-08-28 ENCOUNTER — Telehealth: Payer: Self-pay | Admitting: *Deleted

## 2015-08-28 NOTE — Telephone Encounter (Signed)
Called pt unable to leave message.

## 2015-08-29 ENCOUNTER — Other Ambulatory Visit: Payer: Self-pay | Admitting: Neurology

## 2015-08-29 ENCOUNTER — Other Ambulatory Visit (INDEPENDENT_AMBULATORY_CARE_PROVIDER_SITE_OTHER): Payer: Self-pay

## 2015-08-29 DIAGNOSIS — Z0289 Encounter for other administrative examinations: Secondary | ICD-10-CM

## 2015-08-29 DIAGNOSIS — G35 Multiple sclerosis: Secondary | ICD-10-CM

## 2015-08-30 LAB — CBC
HEMATOCRIT: 40.5 % (ref 34.0–46.6)
HEMOGLOBIN: 13.2 g/dL (ref 11.1–15.9)
MCH: 27.8 pg (ref 26.6–33.0)
MCHC: 32.6 g/dL (ref 31.5–35.7)
MCV: 85 fL (ref 79–97)
Platelets: 257 10*3/uL (ref 150–379)
RBC: 4.75 x10E6/uL (ref 3.77–5.28)
RDW: 14.7 % (ref 12.3–15.4)
WBC: 7.8 10*3/uL (ref 3.4–10.8)

## 2015-09-04 ENCOUNTER — Telehealth: Payer: Self-pay | Admitting: *Deleted

## 2015-09-04 ENCOUNTER — Encounter: Payer: Self-pay | Admitting: Neurology

## 2015-09-04 NOTE — Telephone Encounter (Signed)
Labs collected 08/31/15:  JCV - 0.16 negative

## 2015-09-05 ENCOUNTER — Telehealth: Payer: Self-pay | Admitting: *Deleted

## 2015-09-05 NOTE — Telephone Encounter (Signed)
Message For: OFFICE               Taken 23-MAY-17 at  3:05PM by [facility] ------------------------------------------------------------ Myra Rude            CID [id number]  Patient SAME                 Pt's Dr [address]          Area Code 336 Phone# [phone] * DOB 01/28/1971      RE SAYS HER CHART SAYS SHE MISSED AN APPT AND SHE    SAYS THAT SHE WAS THERE,NEEDS IT CHANGED             Disp:Y/N N If Y = C/B If No Response In ============================================================

## 2015-10-04 ENCOUNTER — Telehealth: Payer: Self-pay | Admitting: Neurology

## 2015-10-04 DIAGNOSIS — R269 Unspecified abnormalities of gait and mobility: Secondary | ICD-10-CM

## 2015-10-04 MED ORDER — BACLOFEN 10 MG PO TABS
10.0000 mg | ORAL_TABLET | Freq: Three times a day (TID) | ORAL | Status: DC
Start: 2015-10-04 — End: 2015-11-29

## 2015-10-04 NOTE — Telephone Encounter (Signed)
Refill baclofen  tid,

## 2015-10-04 NOTE — Telephone Encounter (Signed)
Rx for knee brace.

## 2015-11-29 ENCOUNTER — Encounter: Payer: Self-pay | Admitting: Neurology

## 2015-11-29 ENCOUNTER — Ambulatory Visit (INDEPENDENT_AMBULATORY_CARE_PROVIDER_SITE_OTHER): Payer: BLUE CROSS/BLUE SHIELD | Admitting: Neurology

## 2015-11-29 VITALS — BP 135/82 | HR 74 | Ht 66.0 in | Wt 175.0 lb

## 2015-11-29 DIAGNOSIS — R258 Other abnormal involuntary movements: Secondary | ICD-10-CM | POA: Diagnosis not present

## 2015-11-29 DIAGNOSIS — G35 Multiple sclerosis: Secondary | ICD-10-CM | POA: Diagnosis not present

## 2015-11-29 DIAGNOSIS — R269 Unspecified abnormalities of gait and mobility: Secondary | ICD-10-CM | POA: Diagnosis not present

## 2015-11-29 DIAGNOSIS — G43009 Migraine without aura, not intractable, without status migrainosus: Secondary | ICD-10-CM | POA: Diagnosis not present

## 2015-11-29 DIAGNOSIS — R252 Cramp and spasm: Secondary | ICD-10-CM

## 2015-11-29 MED ORDER — BACLOFEN 10 MG PO TABS: 20.0000 mg | ORAL_TABLET | Freq: Three times a day (TID) | ORAL | 4 refills | Status: DC

## 2015-11-29 NOTE — Progress Notes (Signed)
Chief Complaint  Patient presents with  . Multiple Sclerosis    She is here with her husband, Dana Duke.  Reports an increase in muscle spasms in her bilateral legs.  She is currently taking baclofen 10mg , TID.   [facility]  PATIENT: Dana Duke DOB: 08-19-1970  HISTORICAL (initial visit was in Nov 2014) Ms. Furber is a 45 years old right-handed African American female, referred by Dr. Suann Larry, Janalyn Rouse, she has relapsing remitting multiple sclerosis  Since 2011 she noticed gradual onset slow worsening gait difficulty, she tends to drag her foot across the floor, stiff, has difficulty going upstairs, because of knee pain, she suffered a history of right ankle fracture in 2010, seems to dragging her right foot across the floor more, also complains urinary urgency, no bowel incontinence   MRI at [facility] in May 7th 2015, without contrast  MRI cervical spine (without) demonstrating at least 4 spinal cord T2 hyperintensities at cervico-medullary junction, C2, C3-4 and C6.  MRI brain (without) demonstrating multiple (> 20) round and ovoid, periventricular, callosal and peri-callosal, subcortical, juxtacortical, pontine and cerebellar T2 hyperintensities. Some of these are perpendicular to the lateral ventricles on axial and sagittal views. Some of these are hypointense on T1 views. Findings suspicious for demyelinating disease  CSF showed 5 oligoclonal banding, elevated IgG index   Extensive laboratory evaluations showed normal or negative CBC, CMP, Lyme titer, ANA, NMO, TSH, B12, folic acid, RPR. Positive varicellar zoster antibody  The visual evoked response test above was within normal limits on the left, with prolongation of the P100 latency on the right.   JC virus antibody was 0.15 negative in 10/18/2013  MRI of brain, and cervical spine with contrast, showed no contrast enhancement, there was no lesions noticed in MRI thoracic spine,  She started Plegridy since  July 2015,  began full dose injection since Sept 15 2015. Complains of injection site reactio, lasting 2-4 weeks, she only has mild flu like illness   She has fallen few times, more difficulty walking, when she fell few weeks ago, Sep 15th 2015, she has difficulty getting up, could not move from waist down for 2-3 hours, also noticed slurring of her speech. She has mild fever then  She called Dr. Anne Hahn, had a repeat MRI of the brain Oct 2015, we have reviewed MRI together, continued evidence of periventricular white matter disease consistent with her diagnosis of multiple sclerosis, there was no change compared to previous image in  may 2015  She was started on prednisone  tapering since October 16,2015,  tolerating the medication well, seems to have mild improvement in her gait difficulty   She also complains of frequent headaches, consistent with migraines,   UPDATE Feb 2nd 2016:  Last visit Jan 19th 2016, this is [name] 9-21 visit, she continued to have intermittent headaches, Maxalt 5 mg works most of the time, but sometimes with severe prolonged headaches, repeat dose of Maxalt fail to improve her symptoms, sleep always helps  UPDATE March 3rd 2016: She complains of worsening headaches almost daily, moderate, tried maxalt.without significant improvement, she is taking baclofen ER 30mg , nortriptyline 20mg  qhs. Also with bilateral knee and hip pain, left worse than right, bilateral feet numbness, coldness sensation, which are chronic.  UPDATE May 25th 2016: She is overall doing very well, no significant bilateral lower extremity spasticity over the past few weeks, baseline urinary incontinence, gradually getting worse, accident almost daily basis, she was recently evaluated by her gynecologist, planning on to see a  urologist soon   UPDATE Mar 28 2015: She is with her husband daughter at today's clinical visit, we have reviewed previous MS history, she was started on Plegridy in July 2015,  had a flareup in Oct 2015, was treated with steroid, over the past 18 months, she had slow worsening gait difficulty  We have reviewed MRI of the brain with and without contrast in November 2016:  abnormal MRI of the brain with and without contrast showing T2/FLAIR hyperintense foci at the cervicomedullary junction and within the pons, midbrain and cerebellum in the posterior fossa. Additionally, there are many T2/FLAIR hyperintense foci in the cerebral hemispheres, predominantly in the periventricular white matter. The foci are consistent with chronic demyelinating plaques from multiple sclerosis. None of the foci appears to be acute and there are no enhancing lesions.   abnormal MRI of the cervical spine showing T2 hyperintense foci within the spinal cord at the cervicomedullary junction, C2, C3, C5C6 and C6 as detailed above. Additional foci are noted within the pons. None of the foci appears to be acute. No significant degenerative changes are noted. The lesions are consistent with chronic demyelinating plaques as would be seen in multiple sclerosis. The findings are similar to the MRI report from 08/19/2013.  She still has significant better urgency, no bowel incontinence, taking baclofen ER for bilateral lower extremity spasticity,  Migraine headaches: Overall under good control she is taking nortriptyline 20 mg every night, taking Topamax as needed instead of regular 100 mg twice a day Imitrex as needed was helpful  UPDATE June 29 2015: He started [place] infusion since January 2017, she tolerated well, she has much less frequent headaches, Maxalt continued to help, will taper off Topamax, leave her on nortriptyline 20 mg every night, her bilateral lower extremity spasticity has much improved as well, on lower dose of baclofen ER 30 mg daily,  UPDATE August 16th 2017: She complains of increased bilateral lower extremity now taking baclofen 10 mg 3 times a day, also nortriptyline 20  mg every night as migraine prevention, she barely has any headaches now, tolerating Tysarbri infusion very well, continue have baseline gait abnormality, dragging left leg more, also complains of chronic insomnia,   REVIEW OF SYSTEMS: Full 14 system review of systems performed and notable only for as above  ALLERGIES: Allergies  Allergen Reactions  . Sulfa Antibiotics Nausea And Vomiting    HOME MEDICATIONS: Outpatient Prescriptions Prior to Visit  Medication Sig Dispense Refill  . baclofen (ED BACLOFEN) 10 MG tablet Take 1 tablet (10 mg total) by mouth 3 (three) times daily. 90 each 11  . nortriptyline (PAMELOR) 10 MG capsule One po qhs xone week, then 2 tabs po qhs 60 capsule 11  . rizatriptan (MAXALT-MLT) 5 MG disintegrating tablet Take 1 tablet (5 mg total) by mouth as needed for migraine. May repeat in 2 hours if needed 15 tablet 11     PAST MEDICAL HISTORY: Past Medical History:  Diagnosis Date  . Ankle fracture, right   . History of hysterectomy   . Knee joint dislocation    bilateral  . Migraines   . Multiple sclerosis (HCC)     PAST SURGICAL HISTORY: Past Surgical History:  Procedure Laterality Date  . ABDOMINAL HYSTERECTOMY    . CESAREAN SECTION    . MYOMECTOMY      FAMILY HISTORY: Family History  Problem Relation Age of Onset  . Diabetes Mother   . Osteoarthritis    . Heart disease    .  Rheumatic fever    . Hypertension      SOCIAL HISTORY:  Social History   Social History  . Marital status: Married    Spouse name: Dana Duke  . Number of children: 1  . Years of education: college   Occupational History  .  [employer]    Does not work   Social History Main Topics  . Smoking status: Never Smoker  . Smokeless tobacco: Never Used  . Alcohol use 0.6 oz/week    1 Glasses of wine per week     Comment: On Holidays  . Drug use: No  . Sexual activity: Yes    Birth control/ protection: Surgical   Other Topics Concern  . Not on file    Social History Narrative   Patient is married. Dana Duke).    Patient is unemployed.   Education- College education   Right handed.   Caffeine- None     PHYSICAL EXAM   Vitals:   11/29/15 1601  BP: 135/82  Pulse: 74  Weight: 175 lb (79.4 kg)  Height: 5\' 6"  (1.676 m)   Body mass index is 28.25 kg/m.   PHYSICAL EXAMNIATION:  Gen: NAD, conversant, well nourised, obese, well groomed                     Cardiovascular: Regular rate rhythm, no peripheral edema, warm, nontender. Eyes: Conjunctivae clear without exudates or hemorrhage Neck: Supple, no carotid bruise. Pulmonary: Clear to auscultation bilaterally   NEUROLOGICAL EXAM:  MENTAL STATUS: Speech:    Speech is normal; fluent and spontaneous with normal comprehension.  Cognition:    The patient is oriented to person, place, and time;     recent and remote memory intact;     language fluent;     normal attention, concentration,     fund of knowledge.  CRANIAL NERVES: CN II: Visual fields are full to confrontation. Fundoscopic exam is normal with sharp discs and no vascular changes. Venous pulsations are present bilaterally. Pupils are 4 mm and briskly reactive to light. Visual acuity is 20/20 bilaterally. CN III, IV, VI: extraocular movement are normal. No ptosis. CN V: Facial sensation is intact to pinprick in all 3 divisions bilaterally. Corneal responses are intact.  CN VII: Face is symmetric with normal eye closure and smile. CN VIII: Hearing is normal to rubbing fingers CN IX, X: Palate elevates symmetrically. Phonation is normal. CN XI: Head turning and shoulder shrug are intact CN XII: Tongue is midline with normal movements and no atrophy.  MOTOR: She has mildly bilateral lower extremity spasticity, mild left hip flexion, left ankle dorsiflexion weakness.  REFLEXES: Reflexes are 2+ and symmetric at the biceps, triceps, knees, and ankles. Plantar responses are flexor.  SENSORY: Light touch, pinprick,  position sense, and vibration sense are intact in fingers and toes.  COORDINATION: Rapid alternating movements and fine finger movements are intact. There is no dysmetria on finger-to-nose and heel-knee-shin. There are no abnormal or extraneous movements.   GAIT/STANCE: Stiff, wide based, cautious gait, dragging left leg more  DIAGNOSTIC DATA (LABS, IMAGING, TESTING) - I reviewed patient records, labs, notes, testing and imaging myself where available. JC virus was negative in July 2015, with titer of 0.15, negative March 28 2015  Tysarbri infusion since January 20 fourth 2017 Most recent MRI of the brain with and without contrast in November 2016: T2/FLAIR hyperintense foci at the cervicomedullary junction and within the pons, midbrain and cerebellum in the posterior fossa. Additionally,  there are many T2/FLAIR hyperintense foci in the cerebral hemispheres, predominantly in the periventricular white matter. The foci are consistent with chronic demyelinating plaques from multiple sclerosis. None of the foci appears to be acute and there are no enhancing lesions.  MRI of cervical spine with and without contrast November 2016: T2 hyperintense foci within the spinal cord at the cervicomedullary junction, C2, C3, C5 and C6. Additional foci are noted within the pons. None of the foci appears to be acute. No significant degenerative changes are noted. The lesions are consistent with chronic demyelinating plaques as would be seen in multiple sclerosis. The findings are similar to the MRI report from 08/19/2013.  ASSESSMENT AND PLAN   45 years old right-handed [place] female, with relapsing remitting multiple sclerosis, also reported a history of migraine.  Relapsing remitting multiple sclerosis  Silvestre Momentysarbri since May 09 2015  Negative JC virus antibody  Will repeat MRI brain w/wo  Chronic migraine  continue nortriptyline 20 mg every night as preventative  medications  Imitrex as needed for abortive treatment    Bilateral lower extremity spasticity  Suboptimal control with baclofen 10 mg 3 times a day, increase dose to 2 tablets 3 times a day  Levert FeinsteinYijun Rumi Kolodziej, M.D. Ph.D.  [facility] [address] [place], [place] [address] Phone: [phone] Fax:      [phone]

## 2015-12-29 ENCOUNTER — Ambulatory Visit: Payer: BLUE CROSS/BLUE SHIELD | Admitting: Nurse Practitioner

## 2016-02-06 ENCOUNTER — Encounter: Payer: Self-pay | Admitting: *Deleted

## 2016-02-13 ENCOUNTER — Other Ambulatory Visit: Payer: Self-pay | Admitting: *Deleted

## 2016-02-13 DIAGNOSIS — G35 Multiple sclerosis: Secondary | ICD-10-CM

## 2016-02-19 ENCOUNTER — Other Ambulatory Visit: Payer: Self-pay | Admitting: Family Medicine

## 2016-02-19 DIAGNOSIS — Z1231 Encounter for screening mammogram for malignant neoplasm of breast: Secondary | ICD-10-CM

## 2016-02-21 ENCOUNTER — Ambulatory Visit
Admission: RE | Admit: 2016-02-21 | Discharge: 2016-02-21 | Disposition: A | Discharge status: Home / Self Care | Payer: BLUE CROSS/BLUE SHIELD | Source: Ambulatory Visit | Attending: Neurology | Admitting: Neurology

## 2016-02-21 DIAGNOSIS — G35 Multiple sclerosis: Secondary | ICD-10-CM | POA: Diagnosis not present

## 2016-02-21 MED ORDER — GADOBENATE DIMEGLUMINE 529 MG/ML IV SOLN
15.0000 mL | Freq: Once | INTRAVENOUS | Status: AC | PRN
  Administered 2016-02-21: 15 mL via INTRAVENOUS

## 2016-02-22 ENCOUNTER — Ambulatory Visit
Admission: RE | Admit: 2016-02-22 | Discharge: 2016-02-22 | Disposition: A | Discharge status: Home / Self Care | Payer: BLUE CROSS/BLUE SHIELD | Source: Ambulatory Visit | Attending: Family Medicine | Admitting: Family Medicine

## 2016-02-22 DIAGNOSIS — Z1231 Encounter for screening mammogram for malignant neoplasm of breast: Secondary | ICD-10-CM

## 2016-02-23 ENCOUNTER — Ambulatory Visit: Payer: 59

## 2016-03-14 ENCOUNTER — Encounter: Payer: Self-pay | Admitting: *Deleted

## 2016-04-02 ENCOUNTER — Ambulatory Visit (INDEPENDENT_AMBULATORY_CARE_PROVIDER_SITE_OTHER): Payer: BLUE CROSS/BLUE SHIELD | Admitting: Neurology

## 2016-04-02 ENCOUNTER — Encounter: Payer: Self-pay | Admitting: Neurology

## 2016-04-02 VITALS — BP 129/82 | HR 76 | Ht 66.0 in | Wt 176.8 lb

## 2016-04-02 DIAGNOSIS — G35 Multiple sclerosis: Secondary | ICD-10-CM | POA: Diagnosis not present

## 2016-04-02 DIAGNOSIS — R252 Cramp and spasm: Secondary | ICD-10-CM | POA: Diagnosis not present

## 2016-04-02 DIAGNOSIS — G43009 Migraine without aura, not intractable, without status migrainosus: Secondary | ICD-10-CM

## 2016-04-02 DIAGNOSIS — R5382 Chronic fatigue, unspecified: Secondary | ICD-10-CM

## 2016-04-02 MED ORDER — LIDOCAINE 5 % EX OINT: TOPICAL_OINTMENT | CUTANEOUS | 11 refills | Status: DC | PRN

## 2016-04-02 MED ORDER — MODAFINIL 100 MG PO TABS: 100.0000 mg | ORAL_TABLET | Freq: Every day | ORAL | 5 refills | Status: DC

## 2016-04-02 MED ORDER — MELOXICAM 15 MG PO TABS: 15.0000 mg | ORAL_TABLET | Freq: Every day | ORAL | 11 refills | Status: DC

## 2016-04-02 NOTE — Progress Notes (Signed)
Chief Complaint  Patient presents with  . Multiple Sclerosis    She is here with her daughter, Dana Duke. Says her fatigue is worse.  She is also having left, lower limb pain, especially in the hip and knee.  She would like to review her MRI results.    . Migraine    Migraines have improved with nortriptyline 20mg , qhs.  She estimates having 2-3 per month that respond well to sumatriptan.   [facility]  PATIENT: Dana Duke DOB: 26-Jun-1970  HISTORICAL (initial visit was in Nov 2014) Dana Duke is a 45 years old right-handed African American female, referred by Dr. Suann Larry, Janalyn Rouse, she has relapsing remitting multiple sclerosis  Since 2011 she noticed gradual onset slow worsening gait difficulty, she tends to drag her foot across the floor, stiff, has difficulty going upstairs, because of knee pain, she suffered a history of right ankle fracture in 2010, seems to dragging her right foot across the floor more, also complains urinary urgency, no bowel incontinence   MRI at [facility] in May 7th 2015, without contrast  MRI cervical spine (without) demonstrating at least 4 spinal cord T2 hyperintensities at cervico-medullary junction, C2, C3-4 and C6.  MRI brain (without) demonstrating multiple (> 20) round and ovoid, periventricular, callosal and peri-callosal, subcortical, juxtacortical, pontine and cerebellar T2 hyperintensities. Some of these are perpendicular to the lateral ventricles on axial and sagittal views. Some of these are hypointense on T1 views. Findings suspicious for demyelinating disease  CSF showed 5 oligoclonal banding, elevated IgG index   Extensive laboratory evaluations showed normal or negative CBC, CMP, Lyme titer, ANA, NMO, TSH, B12, folic acid, RPR. Positive varicellar zoster antibody  The visual evoked response test above was within normal limits on the left, with prolongation of the P100 latency on the right.   JC virus antibody was 0.15  negative in 10/18/2013  MRI of brain, and cervical spine with contrast, showed no contrast enhancement, there was no lesions noticed in MRI thoracic spine,  She started Plegridy since July 2015,  began full dose injection since Sept 15 2015. Complains of injection site reactio, lasting 2-4 weeks, she only has mild flu like illness   She has fallen few times, more difficulty walking, when she fell few weeks ago, Sep 15th 2015, she has difficulty getting up, could not move from waist down for 2-3 hours, also noticed slurring of her speech. She has mild fever then  She called Dr. Anne Hahn, had a repeat MRI of the brain Oct 2015, we have reviewed MRI together, continued evidence of periventricular white matter disease consistent with her diagnosis of multiple sclerosis, there was no change compared to previous image in  may 2015  She was started on prednisone  tapering since October 16,2015,  tolerating the medication well, seems to have mild improvement in her gait difficulty   She also complains of frequent headaches, consistent with migraines,   UPDATE Feb 2nd 2016:  Last visit Jan 19th 2016, this is [name] 9-21 visit, she continued to have intermittent headaches, Maxalt 5 mg works most of the time, but sometimes with severe prolonged headaches, repeat dose of Maxalt fail to improve her symptoms, sleep always helps  UPDATE March 3rd 2016: She complains of worsening headaches almost daily, moderate, tried maxalt.without significant improvement, she is taking baclofen ER 30mg , nortriptyline 20mg  qhs. Also with bilateral knee and hip pain, left worse than right, bilateral feet numbness, coldness sensation, which are chronic.  UPDATE May 25th 2016: She is  overall doing very well, no significant bilateral lower extremity spasticity over the past few weeks, baseline urinary incontinence, gradually getting worse, accident almost daily basis, she was recently evaluated by her gynecologist, planning on to  see a urologist soon   UPDATE Mar 28 2015: She is with her husband daughter at today's clinical visit, we have reviewed previous MS history, she was started on Plegridy in July 2015, had a flareup in Oct 2015, was treated with steroid, over the past 18 months, she had slow worsening gait difficulty  We have reviewed MRI of the brain with and without contrast in November 2016:  abnormal MRI of the brain with and without contrast showing T2/FLAIR hyperintense foci at the cervicomedullary junction and within the pons, midbrain and cerebellum in the posterior fossa. Additionally, there are many T2/FLAIR hyperintense foci in the cerebral hemispheres, predominantly in the periventricular white matter. The foci are consistent with chronic demyelinating plaques from multiple sclerosis. None of the foci appears to be acute and there are no enhancing lesions.   abnormal MRI of the cervical spine showing T2 hyperintense foci within the spinal cord at the cervicomedullary junction, C2, C3, C5C6 and C6 as detailed above. Additional foci are noted within the pons. None of the foci appears to be acute. No significant degenerative changes are noted. The lesions are consistent with chronic demyelinating plaques as would be seen in multiple sclerosis. The findings are similar to the MRI report from 08/19/2013.  She still has significant better urgency, no bowel incontinence, taking baclofen ER for bilateral lower extremity spasticity,  Migraine headaches: Overall under good control she is taking nortriptyline 20 mg every night, taking Topamax as needed instead of regular 100 mg twice a day Imitrex as needed was helpful  UPDATE June 29 2015: He started [place]ysarbri infusion since January 2017, she tolerated well, she has much less frequent headaches, Maxalt continued to help, will taper off Topamax, leave her on nortriptyline 20 mg every night, her bilateral lower extremity spasticity has much improved as well,  on lower dose of baclofen ER 30 mg daily,  UPDATE August 16th 2017: She complains of increased bilateral lower extremity now taking baclofen 10 mg 3 times a day, also nortriptyline 20 mg every night as migraine prevention, she barely has any headaches now, tolerating Tysarbri infusion very well, continue have baseline gait abnormality, dragging left leg more, also complains of chronic insomnia,  UPDATE Dec 19th 2017: She complains of left hip and left knee pain, continue mild gait difficulty, tolerating Tysarbri infusion well, no worsening gait abnormality  We have reviewed MRI of the brain without contrast in November 2017:Abnormal MRI scan of the brain with and without contrast showing multiple periventricular, periatrial, subcortical and brainstem white matter hyperintensities consistent with chronic demyelinating disease. No enhancing lesions are noted. Overall no significant change compared with previous MRI scan dated 03/01/2015  REVIEW OF SYSTEMS: Full 14 system review of systems performed and notable only for as above  ALLERGIES: Allergies  Allergen Reactions  . Sulfa Antibiotics Nausea And Vomiting    HOME MEDICATIONS: Outpatient Prescriptions Prior to Visit  Medication Sig Dispense Refill  . baclofen (ED BACLOFEN) 10 MG tablet Take 1 tablet (10 mg total) by mouth 3 (three) times daily. 90 each 11  . nortriptyline (PAMELOR) 10 MG capsule One po qhs xone week, then 2 tabs po qhs 60 capsule 11  . rizatriptan (MAXALT-MLT) 5 MG disintegrating tablet Take 1 tablet (5 mg total) by mouth as needed for migraine.  May repeat in 2 hours if needed 15 tablet 11     PAST MEDICAL HISTORY: Past Medical History:  Diagnosis Date  . Ankle fracture, right   . History of hysterectomy   . Knee joint dislocation    bilateral  . Migraines   . Multiple sclerosis (HCC)     PAST SURGICAL HISTORY: Past Surgical History:  Procedure Laterality Date  . ABDOMINAL HYSTERECTOMY    . CESAREAN SECTION     . MYOMECTOMY      FAMILY HISTORY: Family History  Problem Relation Age of Onset  . Diabetes Mother   . Osteoarthritis    . Heart disease    . Rheumatic fever    . Hypertension      SOCIAL HISTORY:  Social History   Social History  . Marital status: Married    Spouse name: [name]  . Number of children: 1  . Years of education: college   Occupational History  .  [employer]    Does not work   Social History Main Topics  . Smoking status: Never Smoker  . Smokeless tobacco: Never Used  . Alcohol use 0.6 oz/week    1 Glasses of wine per week     Comment: On Holidays  . Drug use: No  . Sexual activity: Yes    Birth control/ protection: Surgical   Other Topics Concern  . Not on file   Social History Narrative   Patient is married. [name]).    Patient is unemployed.   Education- College education   Right handed.   Caffeine- None     PHYSICAL EXAM   Vitals:   04/02/16 1504  BP: 129/82  Pulse: 76  Weight: 176 lb 12 oz (80.2 kg)  Height: 5\' 6"  (1.676 m)   Body mass index is 28.53 kg/m.   PHYSICAL EXAMNIATION:  Gen: NAD, conversant, well nourised, obese, well groomed                     Cardiovascular: Regular rate rhythm, no peripheral edema, warm, nontender. Eyes: Conjunctivae clear without exudates or hemorrhage Neck: Supple, no carotid bruise. Pulmonary: Clear to auscultation bilaterally   NEUROLOGICAL EXAM:  MENTAL STATUS: Speech:    Speech is normal; fluent and spontaneous with normal comprehension.  Cognition:    The patient is oriented to person, place, and time;     recent and remote memory intact;     language fluent;     normal attention, concentration,     fund of knowledge.  CRANIAL NERVES: CN II: Visual fields are full to confrontation. Fundoscopic exam is normal with sharp discs and no vascular changes. Venous pulsations are present bilaterally. Pupils are 4 mm and briskly reactive to light. Visual acuity is 20/20  bilaterally. CN III, IV, VI: extraocular movement are normal. No ptosis. CN V: Facial sensation is intact to pinprick in all 3 divisions bilaterally. Corneal responses are intact.  CN VII: Face is symmetric with normal eye closure and smile. CN VIII: Hearing is normal to rubbing fingers CN IX, X: Palate elevates symmetrically. Phonation is normal. CN XI: Head turning and shoulder shrug are intact CN XII: Tongue is midline with normal movements and no atrophy.  MOTOR: She has mildly bilateral lower extremity spasticity, mild left hip flexion, left ankle dorsiflexion weakness.  REFLEXES: Reflexes are 2+ and symmetric at the biceps, triceps, knees, and ankles. Plantar responses are flexor.  SENSORY: Light touch, pinprick, position sense, and vibration sense are  intact in fingers and toes.  COORDINATION: Rapid alternating movements and fine finger movements are intact. There is no dysmetria on finger-to-nose and heel-knee-shin. There are no abnormal or extraneous movements.   GAIT/STANCE: Stiff, wide based, cautious gait, dragging left leg more  DIAGNOSTIC DATA (LABS, IMAGING, TESTING) - I reviewed patient records, labs, notes, testing and imaging myself where available. JC virus was negative in July 2015, with titer of 0.15, negative March 28 2015  Tysarbri infusion since January 20 fourth 2017 Most recent MRI of the brain with and without contrast in November 2017: T2/FLAIR hyperintense foci at the cervicomedullary junction and within the pons, midbrain and cerebellum in the posterior fossa. Additionally, there are many T2/FLAIR hyperintense foci in the cerebral hemispheres, predominantly in the periventricular white matter. The foci are consistent with chronic demyelinating plaques from multiple sclerosis. None of the foci appears to be acute and there are no enhancing lesions.  MRI of cervical spine with and without contrast November 2016: T2 hyperintense foci within the spinal  cord at the cervicomedullary junction, C2, C3, C5 and C6. Additional foci are noted within the pons. None of the foci appears to be acute. No significant degenerative changes are noted. The lesions are consistent with chronic demyelinating plaques as would be seen in multiple sclerosis. The findings are similar to the MRI report from 08/19/2013.  ASSESSMENT AND PLAN   45 years old right-handed [place] American female, with relapsing remitting multiple sclerosis, also reported a history of migraine.  Relapsing remitting multiple sclerosis  Silvestre Moment since May 09 2015  Negative JC virus antibody  Repeat JC virus antibody  Chronic migraine  continue nortriptyline 20 mg every night as preventative medications  Imitrex as needed for abortive treatment    Bilateral lower extremity spasticity  Suboptimal control with baclofen 10 mg 3 times a day, increase dose to 2 tablets 3 times a day  Levert Feinstein, M.D. Ph.D.  [facility] [address] [place], [place] [address] Phone: [phone] Fax:      [phone]

## 2016-04-03 LAB — COMPREHENSIVE METABOLIC PANEL
ALT: 11 IU/L (ref 0–32)
AST: 12 IU/L (ref 0–40)
Albumin/Globulin Ratio: 1.4 (ref 1.2–2.2)
Albumin: 4.4 g/dL (ref 3.5–5.5)
Alkaline Phosphatase: 53 IU/L (ref 39–117)
BUN/Creatinine Ratio: 16 (ref 9–23)
BUN: 11 mg/dL (ref 6–24)
Bilirubin Total: 0.3 mg/dL (ref 0.0–1.2)
CALCIUM: 9.7 mg/dL (ref 8.7–10.2)
CO2: 23 mmol/L (ref 18–29)
CREATININE: 0.68 mg/dL (ref 0.57–1.00)
Chloride: 101 mmol/L (ref 96–106)
GFR calc Af Amer: 120 mL/min/{1.73_m2} (ref >59)
GFR, EST NON AFRICAN AMERICAN: 105 mL/min/{1.73_m2} (ref >59)
GLUCOSE: 97 mg/dL (ref 65–99)
Globulin, Total: 3.1 g/dL (ref 1.5–4.5)
POTASSIUM: 5.3 mmol/L — AB (ref 3.5–5.2)
Sodium: 142 mmol/L (ref 134–144)
TOTAL PROTEIN: 7.5 g/dL (ref 6.0–8.5)

## 2016-04-03 LAB — CBC
Hematocrit: 38.1 % (ref 34.0–46.6)
Hemoglobin: 12.2 g/dL (ref 11.1–15.9)
MCH: 27.4 pg (ref 26.6–33.0)
MCHC: 32 g/dL (ref 31.5–35.7)
MCV: 85 fL (ref 79–97)
PLATELETS: 214 10*3/uL (ref 150–379)
RBC: 4.46 x10E6/uL (ref 3.77–5.28)
RDW: 14.7 % (ref 12.3–15.4)
WBC: 7.3 10*3/uL (ref 3.4–10.8)

## 2016-04-11 ENCOUNTER — Telehealth: Payer: Self-pay | Admitting: *Deleted

## 2016-04-11 NOTE — Telephone Encounter (Signed)
Labs collected 04/02/16:  JCV - 0.15 negative

## 2016-04-23 NOTE — Telephone Encounter (Signed)
Patient is returning your call.  

## 2016-04-23 NOTE — Telephone Encounter (Signed)
Patient is calling to get lab results

## 2016-04-23 NOTE — Telephone Encounter (Signed)
Spoke to patient she is aware of results

## 2016-04-23 NOTE — Telephone Encounter (Signed)
Left message for a return call

## 2016-05-22 ENCOUNTER — Other Ambulatory Visit: Payer: Self-pay | Admitting: *Deleted

## 2016-05-22 MED ORDER — LIDOCAINE 5 % EX OINT: TOPICAL_OINTMENT | CUTANEOUS | 11 refills | Status: DC | PRN

## 2016-10-02 ENCOUNTER — Ambulatory Visit: Payer: BLUE CROSS/BLUE SHIELD | Admitting: Adult Health

## 2016-10-08 ENCOUNTER — Ambulatory Visit (INDEPENDENT_AMBULATORY_CARE_PROVIDER_SITE_OTHER): Payer: BLUE CROSS/BLUE SHIELD | Admitting: Adult Health

## 2016-10-08 ENCOUNTER — Encounter: Payer: Self-pay | Admitting: Adult Health

## 2016-10-08 VITALS — BP 113/69 | HR 68 | Ht 66.0 in | Wt 188.6 lb

## 2016-10-08 DIAGNOSIS — M2550 Pain in unspecified joint: Secondary | ICD-10-CM

## 2016-10-08 DIAGNOSIS — G35 Multiple sclerosis: Secondary | ICD-10-CM | POA: Diagnosis not present

## 2016-10-08 DIAGNOSIS — R269 Unspecified abnormalities of gait and mobility: Secondary | ICD-10-CM | POA: Diagnosis not present

## 2016-10-08 DIAGNOSIS — G43709 Chronic migraine without aura, not intractable, without status migrainosus: Secondary | ICD-10-CM

## 2016-10-08 MED ORDER — TRAMADOL HCL 50 MG PO TABS: 25.0000 mg | ORAL_TABLET | Freq: Four times a day (QID) | ORAL | 0 refills | Status: DC | PRN

## 2016-10-08 NOTE — Patient Instructions (Addendum)
Your Plan:  Continue Tysabri for MS Blood work today Continue Baclofen for muscle spasticity  Continue Nortriptyline and Imitrex for migraine headaches Try ultram 25 mg for pain. Taking with nortriptyline can put you at risk for seizures. Only use for severe pain.   Thank you for coming to see [place] at [facility]. I hope we have been able to provide you high quality care today.  You may receive a patient satisfaction survey over the next few weeks. We would appreciate your feedback and comments so that we may continue to improve ourselves and the health of our patients.  Tramadol tablets What is this medicine? TRAMADOL (TRA ma dole) is a pain reliever. It is used to treat moderate to severe pain in adults. This medicine may be used for other purposes; ask your health care provider or pharmacist if you have questions. COMMON BRAND NAME(S): Ultram What should I tell my health care provider before I take this medicine? They need to know if you have any of these conditions: -brain tumor -depression -drug abuse or addiction -head injury -if you frequently drink alcohol containing drinks -kidney disease or trouble passing urine -liver disease -lung disease, asthma, or breathing problems -seizures or epilepsy -suicidal thoughts, plans, or attempt; a previous suicide attempt by you or a family member -an unusual or allergic reaction to tramadol, codeine, other medicines, foods, dyes, or preservatives -pregnant or trying to get pregnant -breast-feeding How should I use this medicine? Take this medicine by mouth with a full glass of water. Follow the directions on the prescription label. You can take it with or without food. If it upsets your stomach, take it with food. Do not take your medicine more often than directed. A special MedGuide will be given to you by the pharmacist with each prescription and refill. Be sure to read this information carefully each time. Talk to  your pediatrician regarding the use of this medicine in children. Special care may be needed. Overdosage: If you think you have taken too much of this medicine contact a poison control center or emergency room at once. NOTE: This medicine is only for you. Do not share this medicine with others. What if I miss a dose? If you miss a dose, take it as soon as you can. If it is almost time for your next dose, take only that dose. Do not take double or extra doses. What may interact with this medicine? Do not take this medication with any of the following medicines: -MAOIs like Carbex, Eldepryl, Marplan, Nardil, and Parnate This medicine may also interact with the following medications: -alcohol -antihistamines for allergy, cough and cold -certain medicines for anxiety or sleep -certain medicines for depression like amitriptyline, fluoxetine, sertraline -certain medicines for migraine headache like almotriptan, eletriptan, frovatriptan, naratriptan, rizatriptan, sumatriptan, zolmitriptan -certain medicines for seizures like carbamazepine, oxcarbazepine, phenobarbital, primidone -certain medicines that treat or prevent blood clots like warfarin -digoxin -furazolidone -general anesthetics like halothane, isoflurane, methoxyflurane, propofol -linezolid -local anesthetics like lidocaine, pramoxine, tetracaine -medicines that relax muscles for surgery -other narcotic medicines for pain or cough -phenothiazines like chlorpromazine, mesoridazine, prochlorperazine, thioridazine -procarbazine This list may not describe all possible interactions. Give your health care provider a list of all the medicines, herbs, non-prescription drugs, or dietary supplements you use. Also tell them if you smoke, drink alcohol, or use illegal drugs. Some items may interact with your medicine. What should I watch for while using this medicine? Tell your doctor or health care professional if your  pain does not go away, if it  gets worse, or if you have new or a different type of pain. You may develop tolerance to the medicine. Tolerance means that you will need a higher dose of the medicine for pain relief. Tolerance is normal and is expected if you take this medicine for a long time. Do not suddenly stop taking your medicine because you may develop a severe reaction. Your body becomes used to the medicine. This does NOT mean you are addicted. Addiction is a behavior related to getting and using a drug for a non-medical reason. If you have pain, you have a medical reason to take pain medicine. Your doctor will tell you how much medicine to take. If your doctor wants you to stop the medicine, the dose will be slowly lowered over time to avoid any side effects. There are different types of narcotic medicines (opiates). If you take more than one type at the same time or if you are taking another medicine that also causes drowsiness, you may have more side effects. Give your health care provider a list of all medicines you use. Your doctor will tell you how much medicine to take. Do not take more medicine than directed. Call emergency for help if you have problems breathing or unusual sleepiness. You may get drowsy or dizzy. Do not drive, use machinery, or do anything that needs mental alertness until you know how this medicine affects you. Do not stand or sit up quickly, especially if you are an older patient. This reduces the risk of dizzy or fainting spells. Alcohol can increase or decrease the effects of this medicine. Avoid alcoholic drinks. You may have constipation. Try to have a bowel movement at least every 2 to 3 days. If you do not have a bowel movement for 3 days, call your doctor or health care professional. Your mouth may get dry. Chewing sugarless gum or sucking hard candy, and drinking plenty of water may help. Contact your doctor if the problem does not go away or is severe. What side effects may I notice from  receiving this medicine? Side effects that you should report to your doctor or health care professional as soon as possible: -allergic reactions like skin rash, itching or hives, swelling of the face, lips, or tongue -breathing problems -confusion -seizures -signs and symptoms of low blood pressure like dizziness; feeling faint or lightheaded, falls; unusually weak or tired -trouble passing urine or change in the amount of urine Side effects that usually do not require medical attention (report to your doctor or health care professional if they continue or are bothersome): -constipation -dry mouth -nausea, vomiting -tiredness This list may not describe all possible side effects. Call your doctor for medical advice about side effects. You may report side effects to FDA at 1-800-FDA-1088. Where should I keep my medicine? Keep out of the reach of children. This medicine may cause accidental overdose and death if it taken by other adults, children, or pets. Mix any unused medicine with a substance like cat litter or coffee grounds. Then throw the medicine away in a sealed container like a sealed bag or a coffee can with a lid. Do not use the medicine after the expiration date. Store at room temperature between 15 and 30 degrees C (59 and 86 degrees F). NOTE: This sheet is a summary. It may not cover all possible information. If you have questions about this medicine, talk to your doctor, pharmacist, or health care provider.  2018 Elsevier/Gold Standard (2014-12-25 09:00:04)

## 2016-10-08 NOTE — Progress Notes (Signed)
PATIENT: Dana Duke DOB: 17-Aug-1970  REASON FOR VISIT: follow up- multiple sclerosis HISTORY FROM: patient-   HISTORY OF PRESENT ILLNESS: HISTORY (initial visit was in Nov 2014) Dana Duke is a 46 years old right-handed African American female, referred by Dr. Suann Larry, Janalyn Rouse, she has relapsing remitting multiple sclerosis  Since 2011 she noticed gradual onset slow worsening gait difficulty, she tends to drag her foot across the floor, stiff, has difficulty going upstairs, because of knee pain, she suffered a history of right ankle fracture in 2010, seems to dragging her right foot across the floor more, also complains urinary urgency, no bowel incontinence   MRI at [facility] in May 7th 2015, without contrast  MRI cervical spine (without) demonstrating at least 4 spinal cord T2 hyperintensities at cervico-medullary junction, C2, C3-4 and C6.  MRI brain (without) demonstrating multiple (> 20) round and ovoid, periventricular, callosal and peri-callosal, subcortical, juxtacortical, pontine and cerebellar T2 hyperintensities. Some of these are perpendicular to the lateral ventricles on axial and sagittal views. Some of these are hypointense on T1 views. Findings suspicious for demyelinating disease  CSF showed 5 oligoclonal banding, elevated IgG index   Extensive laboratory evaluations showed normal or negative CBC, CMP, Lyme titer, ANA, NMO, TSH, B12, folic acid, RPR. Positive varicellar zoster antibody  The visual evoked response test above was within normal limits on the left, with prolongation of the P100 latency on the right.   JC virus antibody was 0.15 negative in 10/18/2013  MRI of brain, and cervical spine with contrast, showed no contrast enhancement, there was no lesions noticed in MRI thoracic spine,  She started Plegridy since July 2015,  began full dose injection since Sept 15 2015. Complains of injection site reactio, lasting 2-4 weeks, she only has mild  flu like illness   She has fallen few times, more difficulty walking, when she fell few weeks ago, Sep 15th 2015, she has difficulty getting up, could not move from waist down for 2-3 hours, also noticed slurring of her speech. She has mild fever then  She called Dr. Anne Hahn, had a repeat MRI of the brain Oct 2015, we have reviewed MRI together, continued evidence of periventricular white matter disease consistent with her diagnosis of multiple sclerosis, there was no change compared to previous image in  may 2015  She was started on prednisone  tapering since October 16,2015,  tolerating the medication well, seems to have mild improvement in her gait difficulty   She also complains of frequent headaches, consistent with migraines,   UPDATE Feb 2nd 2016:  Last visit Jan 19th 2016, this is [name] 9-21 visit, she continued to have intermittent headaches, Maxalt 5 mg works most of the time, but sometimes with severe prolonged headaches, repeat dose of Maxalt fail to improve her symptoms, sleep always helps  UPDATE March 3rd 2016: She complains of worsening headaches almost daily, moderate, tried maxalt.without significant improvement, she is taking baclofen ER 30mg , nortriptyline 20mg  qhs. Also with bilateral knee and hip pain, left worse than right, bilateral feet numbness, coldness sensation, which are chronic.  UPDATE May 25th 2016: She is overall doing very well, no significant bilateral lower extremity spasticity over the past few weeks, baseline urinary incontinence, gradually getting worse, accident almost daily basis, she was recently evaluated by her gynecologist, planning on to see a urologist soon   UPDATE Mar 28 2015: She is with her husband daughter at today's clinical visit, we have reviewed previous MS history, she was started  on Plegridy in July 2015, had a flareup in Oct 2015, was treated with steroid, over the past 18 months, she had slow worsening gait difficulty  We  have reviewed MRI of the brain with and without contrast in November 2016:  abnormal MRI of the brain with and without contrast showing T2/FLAIR hyperintense foci at the cervicomedullary junction and within the pons, midbrain and cerebellum in the posterior fossa. Additionally, there are many T2/FLAIR hyperintense foci in the cerebral hemispheres, predominantly in the periventricular white matter. The foci are consistent with chronic demyelinating plaques from multiple sclerosis. None of the foci appears to be acute and there are no enhancing lesions.   abnormal MRI of the cervical spine showing T2 hyperintense foci within the spinal cord at the cervicomedullary junction, C2, C3, C5C6 and C6 as detailed above. Additional foci are noted within the pons. None of the foci appears to be acute. No significant degenerative changes are noted. The lesions are consistent with chronic demyelinating plaques as would be seen in multiple sclerosis. The findings are similar to the MRI report from 08/19/2013.  She still has significant better urgency, no bowel incontinence, taking baclofen ER for bilateral lower extremity spasticity,  Migraine headaches: Overall under good control she is taking nortriptyline 20 mg every night, taking Topamax as needed instead of regular 100 mg twice a day Imitrex as needed was helpful  UPDATE June 29 2015: He started [place] infusion since January 2017, she tolerated well, she has much less frequent headaches, Maxalt continued to help, will taper off Topamax, leave her on nortriptyline 20 mg every night, her bilateral lower extremity spasticity has much improved as well, on lower dose of baclofen ER 30 mg daily,  UPDATE August 16th 2017: She complains of increased bilateral lower extremity now taking baclofen 10 mg 3 times a day, also nortriptyline 20 mg every night as migraine prevention, she barely has any headaches now, tolerating Tysarbri infusion very well,  continue have baseline gait abnormality, dragging left leg more, also complains of chronic insomnia,  UPDATE Dec 19th 2017: She complains of left hip and left knee pain, continue mild gait difficulty, tolerating Tysarbri infusion well, no worsening gait abnormality  We have reviewed MRI of the brain without contrast in November 2017:Abnormal MRI scan of the brain with and without contrast showing multiple periventricular, periatrial, subcortical and brainstem white matter hyperintensities consistent with chronic demyelinating disease. No enhancing lesions are noted. Overall no significant change compared with previous MRI scan dated 03/01/2015  UPDATE 10/08/16 (MM):  Ms. Pinheiro is a 46 year old female with a history of multiple sclerosis and migraine headaches. She returns today for follow-up. She is currently on to stop breathing and tolerating her infusions well. She denies any significant changes with her gait or balance- she reports ongoing trouble with her gait. She reports that she did have a fall approximately 3 weeks ago. Reportedly she did not sustain any injuries. Denies any new numbness or weakness. Denies any changes with her vision. No change with the bowels or bladder. For that she is always have bladder frequency and urgency She reports that her migraine headaches have been under good control. She is currently on nortriptyline 20 mg at bedtime. She also uses Imitrex as needed. She reports that she is no longer taking baclofen but she did not find it beneficial. She reports that she continues to have ongoing joint pain specifically in the left knee and hip. She is tried moving in the past with no benefit. She  states thatit is tolerable however some day she has severe discomfort with no relief. She returns today for an evaluation.    REVIEW OF SYSTEMS: Out of a complete 14 system review of symptoms, the patient complains only of the following symptoms, and all other reviewed systems are  negative.  Fatigue, unexpected weight change, incontinence of bladder, frequency of urination, urgency, numbness, memory loss, daytime sleepiness  ALLERGIES: Allergies  Allergen Reactions  . Sulfa Antibiotics Nausea And Vomiting    HOME MEDICATIONS: Outpatient Medications Prior to Visit  Medication Sig Dispense Refill  . baclofen (LIORESAL) 10 MG tablet Take 2 tablets (20 mg total) by mouth 3 (three) times daily. 540 each 4  . Diclofenac Sodium 3 % GEL Place onto the skin. Apply 1-2 grams topically to the affected area 2-3 times daily. Pain and anti-inflammatory. (QS to 88-day supply for a total of 700g x 3 refills) Prescribed by Dr. Terrace Arabia - signed paper request/prescription sent to [facility] (ph: [phone], Fax: [phone]).    Marland Kitchen lidocaine (XYLOCAINE) 5 % ointment Apply topically as needed. Apply 2-3 grams topically to the affected area 3-4 times daily. Pain and anti-inflammatory. (QS to 89-day supply for a total of 1063.2g x 3 refills) Prescribed by Dr. Terrace Arabia - signed paper request/prescription sent to [facility] (ph: [phone], Fax: [phone]). 50 g 11  . meloxicam (MOBIC) 15 MG tablet Take 1 tablet (15 mg total) by mouth daily. 30 tablet 11  . modafinil (PROVIGIL) 100 MG tablet Take 1 tablet (100 mg total) by mouth daily. 30 tablet 5  . Natalizumab (TYSABRI IV) Inject into the vein. Every 28 days    . nortriptyline (PAMELOR) 10 MG capsule Take 2 capsules (20 mg total) by mouth at bedtime. 180 capsule 4  . ondansetron (ZOFRAN) 4 MG tablet Take 1 tablet (4 mg total) by mouth every 8 (eight) hours as needed for nausea or vomiting. 20 tablet 6  . SUMAtriptan (IMITREX) 100 MG tablet Take 100 mg by mouth every 2 (two) hours as needed. May repeat in 2 hours if headache persists or recurs.     Facility-Administered Medications Prior to Visit  Medication Dose Route Frequency Provider Last Rate Last Dose  . gadopentetate dimeglumine (MAGNEVIST) injection 20 mL  20 mL  Intravenous Once PRN Levert Feinstein, MD        PAST MEDICAL HISTORY: Past Medical History:  Diagnosis Date  . Ankle fracture, right   . History of hysterectomy   . Knee joint dislocation    bilateral  . Migraines   . Multiple sclerosis (HCC)     PAST SURGICAL HISTORY: Past Surgical History:  Procedure Laterality Date  . ABDOMINAL HYSTERECTOMY    . CESAREAN SECTION    . MYOMECTOMY      FAMILY HISTORY: Family History  Problem Relation Age of Onset  . Diabetes Mother   . Osteoarthritis Unknown   . Heart disease Unknown   . Rheumatic fever Unknown   . Hypertension Unknown     SOCIAL HISTORY: Social History   Social History  . Marital status: Married    Spouse name: [name]  . Number of children: 1  . Years of education: college   Occupational History  .  [employer]    Does not work   Social History Main Topics  . Smoking status: Never Smoker  . Smokeless tobacco: Never Used  . Alcohol use 0.6 oz/week    1 Glasses of wine per week     Comment: On  Holidays  . Drug use: No  . Sexual activity: Yes    Birth control/ protection: Surgical   Other Topics Concern  . Not on file   Social History Narrative   Patient is married. [name]).    Patient is unemployed.   Education- College education   Right handed.   Caffeine- None      PHYSICAL EXAM  Vitals:   10/08/16 1402  Weight: 188 lb 9.6 oz (85.5 kg)  Height: 5\' 6"  (1.676 m)   Body mass index is 30.44 kg/m.  Generalized: Well developed, in no acute distress   Neurological examination  Mentation: Alert oriented to time, place, history taking. Follows all commands speech and language fluent Cranial nerve II-XII: Pupils were equal round reactive to light. Extraocular movements were full, visual field were full on confrontational test. Facial sensation and strength were normal. Uvula tongue midline. Head turning and shoulder shrug  were normal and symmetric. Motor: The motor testing reveals 5 over 5  strength of all 4 extremities. Good symmetric motor tone is noted throughout.  Sensory: Sensory testing is intact to soft touch on all 4 extremities. No evidence of extinction is noted.  Coordination: Cerebellar testing reveals good finger-nose-finger and heel-to-shin bilaterally.  Gait and station: Gait is slightly wide-based and unsteady. She is not using an assistive device today. Tandem gait not attempted. Romberg is negative. Reflexes: Deep tendon reflexes are symmetric and normal bilaterally.   DIAGNOSTIC DATA (LABS, IMAGING, TESTING) - I reviewed patient records, labs, notes, testing and imaging myself where available.  Lab Results  Component Value Date   WBC 7.3 04/02/2016   HGB 12.2 04/02/2016   HCT 38.1 04/02/2016   MCV 85 04/02/2016   PLT 214 04/02/2016      Component Value Date/Time   NA 142 04/02/2016 1537   K 5.3 (H) 04/02/2016 1537   CL 101 04/02/2016 1537   CO2 23 04/02/2016 1537   GLUCOSE 97 04/02/2016 1537   BUN 11 04/02/2016 1537   CREATININE 0.68 04/02/2016 1537   CALCIUM 9.7 04/02/2016 1537   PROT 7.5 04/02/2016 1537   ALBUMIN 4.4 04/02/2016 1537   AST 12 04/02/2016 1537   ALT 11 04/02/2016 1537   ALKPHOS 53 04/02/2016 1537   BILITOT 0.3 04/02/2016 1537   GFRNONAA 105 04/02/2016 1537   GFRAA 120 04/02/2016 1537    Lab Results  Component Value Date   VITAMINB12 314 08/23/2013   Lab Results  Component Value Date   TSH 0.613 03/28/2015      ASSESSMENT AND PLAN 46 y.o. year old female  has a past medical history of Ankle fracture, right; History of hysterectomy; Knee joint dislocation; Migraines; and Multiple sclerosis (HCC). here with:  1. Multiple sclerosis 2. Abnormality of gait 3. Joint pain 4. migraine  The patient will continue on Tysabri. I will check blood work today. The patient continues to have joint pain in the left knee and hip. This pain is not always constant but sometimes becomes intolerable. I will give the patient a  prescription of Ultram 25-50 mg every 6 hours for severe pain. She will continue on nortriptyline 20 mg at bedtime for migraines. I advised that her nortriptyline and Ultram can interact putting her at risk for serotonin syndrome and increased risk for seizures. I have advised patient the symptoms of serotonin syndrome. She voices understanding. I again reiterated to the patient that she will use ultram for severe pain and should not taken daily. She voices understanding. She will  follow-up in 6 months with Dr. Terrace Arabia.     Butch Penny, MSN, NP-C 10/08/2016, 2:10 PM [facility] [address], [address] [place], [place] [address] [phone]

## 2016-10-09 ENCOUNTER — Telehealth: Payer: Self-pay | Admitting: *Deleted

## 2016-10-09 LAB — CBC WITH DIFFERENTIAL/PLATELET
BASOS ABS: 0 10*3/uL (ref 0.0–0.2)
Basos: 0 %
EOS (ABSOLUTE): 0.2 10*3/uL (ref 0.0–0.4)
Eos: 2 %
HEMOGLOBIN: 12.7 g/dL (ref 11.1–15.9)
Hematocrit: 38.9 % (ref 34.0–46.6)
IMMATURE GRANS (ABS): 0 10*3/uL (ref 0.0–0.1)
Immature Granulocytes: 0 %
LYMPHS: 38 %
Lymphocytes Absolute: 3.1 10*3/uL (ref 0.7–3.1)
MCH: 27.4 pg (ref 26.6–33.0)
MCHC: 32.6 g/dL (ref 31.5–35.7)
MCV: 84 fL (ref 79–97)
MONOCYTES: 9 %
Monocytes Absolute: 0.7 10*3/uL (ref 0.1–0.9)
Neutrophils Absolute: 4.1 10*3/uL (ref 1.4–7.0)
Neutrophils: 51 %
PLATELETS: 208 10*3/uL (ref 150–379)
RBC: 4.64 x10E6/uL (ref 3.77–5.28)
RDW: 15.5 % — ABNORMAL HIGH (ref 12.3–15.4)
WBC: 8.1 10*3/uL (ref 3.4–10.8)

## 2016-10-09 NOTE — Telephone Encounter (Signed)
-----   Message from Dana Penny, NP sent at 10/09/2016  7:20 AM EDT ----- Lab work unremarkable. Waiting on JCV. Please call patient with results.

## 2016-10-09 NOTE — Telephone Encounter (Signed)
Called and spoke with patient about labs results per MM,NP note. She verbalized understanding.  Advised she was told to follow up in 6 months with Dr Terrace Arabia. When I checked schedule, she was scheduled in January with MM,NP again. Advised I will check with Aundra Millet to make sure this appt is ok to keep. If not, we will call back to r/s with Dr Terrace Arabia.

## 2016-10-09 NOTE — Progress Notes (Signed)
Fax confirmation received for tramadol to [facility] [phone]. sy

## 2016-10-09 NOTE — Telephone Encounter (Signed)
Ok to be with me or Dr. Terrace Arabia- her preference.

## 2016-10-10 NOTE — Telephone Encounter (Signed)
Spoke to pt and she is fine to see MM/NP.  Kept appt as originally scheduled.

## 2016-10-18 ENCOUNTER — Telehealth: Payer: Self-pay | Admitting: *Deleted

## 2016-10-18 NOTE — Telephone Encounter (Signed)
Noted. Please make patient aware.  

## 2016-10-18 NOTE — Telephone Encounter (Signed)
Spoke to pt and relayed that JCV antibody was negative.  She verbalized understanding.

## 2016-10-18 NOTE — Telephone Encounter (Signed)
Received fax [facility] daignostic JCV negative 0.18 index.

## 2016-10-26 NOTE — Progress Notes (Signed)
Personally  participated in, made any corrections needed, and agree with history, physical, neuro exam,assessment and plan as stated.     Antonia Ahern, MD [facility]     

## 2016-12-13 ENCOUNTER — Encounter (HOSPITAL_BASED_OUTPATIENT_CLINIC_OR_DEPARTMENT_OTHER): Payer: Self-pay

## 2016-12-13 ENCOUNTER — Emergency Department (HOSPITAL_BASED_OUTPATIENT_CLINIC_OR_DEPARTMENT_OTHER)
Admission: EM | Admit: 2016-12-13 | Discharge: 2016-12-13 | Disposition: A | Discharge status: Home / Self Care | Payer: BLUE CROSS/BLUE SHIELD | Attending: Emergency Medicine | Admitting: Emergency Medicine

## 2016-12-13 DIAGNOSIS — Z79899 Other long term (current) drug therapy: Secondary | ICD-10-CM | POA: Diagnosis not present

## 2016-12-13 DIAGNOSIS — K0889 Other specified disorders of teeth and supporting structures: Secondary | ICD-10-CM | POA: Diagnosis present

## 2016-12-13 MED ORDER — BENZOCAINE (TOPICAL) 20 % EX AERO
INHALATION_SPRAY | Freq: Four times a day (QID) | CUTANEOUS | Status: DC | PRN
  Administered 2016-12-13: 1 via OROMUCOSAL
  Filled 2016-12-13 (×2): qty 57

## 2016-12-13 MED ORDER — IBUPROFEN 800 MG PO TABS: 800.0000 mg | ORAL_TABLET | Freq: Three times a day (TID) | ORAL | 0 refills | Status: DC

## 2016-12-13 MED ORDER — MAGIC MOUTHWASH
ORAL | Status: AC
  Filled 2016-12-13: qty 10

## 2016-12-13 MED ORDER — DIAZEPAM 5 MG/ML IJ SOLN
5.0000 mg | Freq: Once | INTRAMUSCULAR | Status: AC
  Administered 2016-12-13: 5 mg via INTRAMUSCULAR
  Filled 2016-12-13: qty 2

## 2016-12-13 NOTE — Discharge Instructions (Signed)
As discussed, continue with previous instructions to drink fluids then move on tomorrow to soft foods as previously advised. Take ibuprofen 800 every 8 hours and hydrocodone for breakthrough pain. Follow-up with your dentist as soon as possible.  Apply ice to the area for comfort. Return if you experience facial swelling, difficulty swallowing, difficulty breathing, or any other new concerning symptoms in the meantime.

## 2016-12-13 NOTE — ED Triage Notes (Signed)
Pt c/o left lower tooth extraction today-pain not relieved by hydrocodone-NAD-presents to triage in w/c

## 2016-12-13 NOTE — ED Provider Notes (Signed)
Patient seen and evaluated. Has pain after extraction. Patient given periapical, an inferior alveolar nerve block by myself with 0.5% Marcaine. Socket packed with eugenol and cotton roll.   Rolland Porter, MD 12/13/16 2250

## 2016-12-13 NOTE — ED Provider Notes (Signed)
[facility] Provider Note   CSN: [id number] Arrival date & time: 12/13/16  1926     History   Chief Complaint Chief Complaint  Patient presents with  . Dental Pain    HPI Dana Duke is a 46 y.o. female presenting with left lower molar pain after dental extraction a few hours prior to arrival. Patient reports that as soon as the dental block wore off she started experiencing severe pain. She reports that she was not told that she would be experiencing this sort of pain after extraction. She has tried taking Norco 2 approximately 2 hours prior to arrival with no relief and increased pain.   HPI  Past Medical History:  Diagnosis Date  . Ankle fracture, right   . History of hysterectomy   . Knee joint dislocation    bilateral  . Migraines   . Multiple sclerosis [facility])     Patient Active Problem List   Diagnosis Date Noted  . Spasticity 06/29/2015  . ECG abnormality 06/23/2014  . Migraine without aura and without status migrainosus, not intractable 06/16/2014  . Nonspecific abnormal electrocardiogram (ECG) (EKG) 06/14/2014  . Headache 10/28/2013  . Multiple sclerosis (HCC) 08/23/2013  . Gait difficulty 02/22/2013    Past Surgical History:  Procedure Laterality Date  . ABDOMINAL HYSTERECTOMY    . CESAREAN SECTION    . MYOMECTOMY      OB History    Gravida Para Term Preterm AB Living   2 1 1   1 1    SAB TAB Ectopic Multiple Live Births     1             Home Medications    Prior to Admission medications   Medication Sig Start Date End Date Taking? Authorizing Provider  acetaminophen-codeine (TYLENOL #3) 300-30 MG tablet Take by mouth every 4 (four) hours as needed for moderate pain.   Yes [provider]  HYDROcodone-acetaminophen (NORCO/VICODIN) 5-325 MG tablet Take 1 tablet by mouth every 6 (six) hours as needed for moderate pain.   Yes [provider]  ibuprofen (ADVIL,MOTRIN) 800 MG tablet Take 1 tablet (800 mg total) by  mouth 3 (three) times daily. 12/13/16   Mathews Robinsons B, PA-C  Natalizumab (TYSABRI IV) Inject into the vein. Every 28 days    [provider]  nortriptyline (PAMELOR) 10 MG capsule Take 2 capsules (20 mg total) by mouth at bedtime. 06/29/15   Levert Feinstein, MD  traMADol (ULTRAM) 50 MG tablet Take 0.5-1 tablets (25-50 mg total) by mouth every 6 (six) hours as needed for severe pain. 10/08/16   Butch Penny, NP    Family History Family History  Problem Relation Age of Onset  . Diabetes Mother   . Osteoarthritis Unknown   . Heart disease Unknown   . Rheumatic fever Unknown   . Hypertension Unknown     Social History Social History  Substance Use Topics  . Smoking status: Never Smoker  . Smokeless tobacco: Never Used  . Alcohol use No     Allergies   Sulfa antibiotics   Review of Systems Review of Systems  Constitutional: Negative for chills and fever.  HENT: Positive for dental problem. Negative for drooling, facial swelling, sinus pain, sore throat and trouble swallowing.   Respiratory: Negative for choking, chest tightness, shortness of breath and wheezing.   Gastrointestinal: Negative for nausea and vomiting.  Musculoskeletal: Negative for neck pain and neck stiffness.  Skin: Negative for color change, pallor, rash and wound.  Neurological: Negative for dizziness, facial asymmetry, weakness, light-headedness and headaches.     Physical Exam Updated Vital Signs BP 140/85 (BP Location: Right Arm)   Pulse 80   Temp 99.1 F (37.3 C) (Oral)   Resp 18   Ht 5\' 6"  (1.676 m)   Wt 85.3 kg (188 lb)   SpO2 99%   BMI 30.34 kg/m   Physical Exam  Constitutional: She appears well-developed and well-nourished. No distress.  Afebrile, nontoxic-appearing, lying comfortably in bed in no acute distress.  HENT:  Head: Normocephalic and atraumatic.  Eyes: Conjunctivae are normal.  Neck: Neck supple.  Cardiovascular: Normal rate and regular rhythm.   No murmur  heard. Pulmonary/Chest: Effort normal and breath sounds normal. No respiratory distress.  Musculoskeletal: She exhibits no edema.  Neurological: She is alert.  Skin: Skin is warm and dry. She is not diaphoretic.  Psychiatric: She has a normal mood and affect.  Nursing note and vitals reviewed.    ED Treatments / Results  Labs (all labs ordered are listed, but only abnormal results are displayed) Labs Reviewed - No data to display  EKG  EKG Interpretation None       Radiology No results found.  Procedures Procedures (including critical care time)  Periodontal Exam **delete this**  Dental block performed by Dr. Fayrene Fearing See his note  Medications Ordered in ED Medications  benzocaine (HURRICAINE) 20 % oral spray (1 application Mouth/Throat Given 12/13/16 2257)  diazepam (VALIUM) injection 5 mg (5 mg Intramuscular Given 12/13/16 2142)     Initial Impression / Assessment and Plan / ED Course  I have reviewed the triage vital signs and the nursing notes.  Pertinent labs & imaging results that were available during my care of the patient were reviewed by me and considered in my medical decision making (see chart for details).     Patient presenting with left lower dental pain after extraction a few hours prior to arrival.   Patient with toothache.  No gross abscess.  Exam unconcerning for Ludwig's angina or spread of infection.  Will treat with pain medicine.  Urged patient to follow-up with dentist.    Patient had muscle spasms to her face and was treated with Valium with improvement.  Patient's pain was controlled while in the emergency Department and dental block was performed by Dr. Fayrene Fearing.  Patient was discharged home with close follow-up with dentist, PCP as needed and symptomatic relief.  Discussed strict return precautions and advised to return to the emergency department if experiencing any new or worsening symptoms. Instructions were understood and patient agreed  with discharge plan.  Final Clinical Impressions(s) / ED Diagnoses   Final diagnoses:  Pain, dental    New Prescriptions Discharge Medication List as of 12/13/2016 10:45 PM    START taking these medications   Details  ibuprofen (ADVIL,MOTRIN) 800 MG tablet Take 1 tablet (800 mg total) by mouth 3 (three) times daily., Starting Fri 12/13/2016, Print         Georgiana Shore, PA-C 12/14/16 7829    Rolland Porter, MD 12/28/16 2104

## 2017-01-28 ENCOUNTER — Other Ambulatory Visit: Payer: Self-pay | Admitting: *Deleted

## 2017-01-28 DIAGNOSIS — G35 Multiple sclerosis: Secondary | ICD-10-CM

## 2017-02-13 ENCOUNTER — Telehealth: Payer: Self-pay | Admitting: Neurology

## 2017-02-13 ENCOUNTER — Ambulatory Visit
Admission: RE | Admit: 2017-02-13 | Discharge: 2017-02-13 | Disposition: A | Discharge status: Home / Self Care | Payer: BLUE CROSS/BLUE SHIELD | Source: Ambulatory Visit | Attending: Neurology | Admitting: Neurology

## 2017-02-13 DIAGNOSIS — G35 Multiple sclerosis: Secondary | ICD-10-CM

## 2017-02-13 MED ORDER — GADOBENATE DIMEGLUMINE 529 MG/ML IV SOLN
15.0000 mL | Freq: Once | INTRAVENOUS | Status: AC | PRN
  Administered 2017-02-13: 15 mL via INTRAVENOUS

## 2017-02-13 NOTE — Telephone Encounter (Signed)
Please call patient, repeat MRI of the brain with and without contrast continued evidence of MS lesions, there was no change compared to previous scan in November 2017  IMPRESSION:  This MRI of the brain with and without contrast shows the following: 1.   Multiple T2/FLAIR hyperintense foci in the brainstem, cerebellum and cerebral hemispheres in a pattern and configuration consistent with chronic demyelinating plaque associated with multiple sclerosis. None of the foci appears to be acute. When compared to the MRI dated 02/21/2016, there is no interval change. 2.    There is a normal enhancement pattern and there are no acute findings.

## 2017-02-13 NOTE — Telephone Encounter (Signed)
Spoke to patient she is aware of the results ?

## 2017-02-18 ENCOUNTER — Other Ambulatory Visit: Payer: Self-pay | Admitting: Family Medicine

## 2017-02-18 DIAGNOSIS — Z1231 Encounter for screening mammogram for malignant neoplasm of breast: Secondary | ICD-10-CM

## 2017-02-27 ENCOUNTER — Ambulatory Visit
Admission: RE | Admit: 2017-02-27 | Discharge: 2017-02-27 | Disposition: A | Discharge status: Home / Self Care | Payer: BLUE CROSS/BLUE SHIELD | Source: Ambulatory Visit | Attending: Family Medicine | Admitting: Family Medicine

## 2017-02-27 DIAGNOSIS — Z1231 Encounter for screening mammogram for malignant neoplasm of breast: Secondary | ICD-10-CM

## 2017-03-18 ENCOUNTER — Ambulatory Visit: Payer: BLUE CROSS/BLUE SHIELD

## 2017-03-20 ENCOUNTER — Encounter: Payer: Self-pay | Admitting: Neurology

## 2017-03-20 ENCOUNTER — Ambulatory Visit (INDEPENDENT_AMBULATORY_CARE_PROVIDER_SITE_OTHER): Payer: BLUE CROSS/BLUE SHIELD | Admitting: Neurology

## 2017-03-20 ENCOUNTER — Other Ambulatory Visit: Payer: Self-pay

## 2017-03-20 VITALS — BP 111/74 | HR 75 | Resp 18 | Ht 66.0 in | Wt 184.5 lb

## 2017-03-20 DIAGNOSIS — G35 Multiple sclerosis: Secondary | ICD-10-CM

## 2017-03-20 MED ORDER — DULOXETINE HCL 60 MG PO CPEP: 60.0000 mg | ORAL_CAPSULE | Freq: Every day | ORAL | 11 refills | Status: DC

## 2017-03-20 NOTE — Progress Notes (Signed)
PATIENT: Dana Duke DOB: 17-Aug-1970  REASON FOR VISIT: follow up- multiple sclerosis HISTORY FROM: patient-   HISTORY OF PRESENT ILLNESS: HISTORY (initial visit was in Nov 2014) Dana Duke is a 46 years old right-handed African American female, referred by Dr. Suann Larry, Janalyn Rouse, she has relapsing remitting multiple sclerosis  Since 2011 she noticed gradual onset slow worsening gait difficulty, she tends to drag her foot across the floor, stiff, has difficulty going upstairs, because of knee pain, she suffered a history of right ankle fracture in 2010, seems to dragging her right foot across the floor more, also complains urinary urgency, no bowel incontinence   MRI at [facility] in May 7th 2015, without contrast  MRI cervical spine (without) demonstrating at least 4 spinal cord T2 hyperintensities at cervico-medullary junction, C2, C3-4 and C6.  MRI brain (without) demonstrating multiple (> 20) round and ovoid, periventricular, callosal and peri-callosal, subcortical, juxtacortical, pontine and cerebellar T2 hyperintensities. Some of these are perpendicular to the lateral ventricles on axial and sagittal views. Some of these are hypointense on T1 views. Findings suspicious for demyelinating disease  CSF showed 5 oligoclonal banding, elevated IgG index   Extensive laboratory evaluations showed normal or negative CBC, CMP, Lyme titer, ANA, NMO, TSH, B12, folic acid, RPR. Positive varicellar zoster antibody  The visual evoked response test above was within normal limits on the left, with prolongation of the P100 latency on the right.   JC virus antibody was 0.15 negative in 10/18/2013  MRI of brain, and cervical spine with contrast, showed no contrast enhancement, there was no lesions noticed in MRI thoracic spine,  She started Plegridy since July 2015,  began full dose injection since Sept 15 2015. Complains of injection site reactio, lasting 2-4 weeks, she only has mild  flu like illness   She has fallen few times, more difficulty walking, when she fell few weeks ago, Sep 15th 2015, she has difficulty getting up, could not move from waist down for 2-3 hours, also noticed slurring of her speech. She has mild fever then  She called Dr. Anne Hahn, had a repeat MRI of the brain Oct 2015, we have reviewed MRI together, continued evidence of periventricular white matter disease consistent with her diagnosis of multiple sclerosis, there was no change compared to previous image in  may 2015  She was started on prednisone  tapering since October 16,2015,  tolerating the medication well, seems to have mild improvement in her gait difficulty   She also complains of frequent headaches, consistent with migraines,   UPDATE Feb 2nd 2016:  Last visit Jan 19th 2016, this is [name] 9-21 visit, she continued to have intermittent headaches, Maxalt 5 mg works most of the time, but sometimes with severe prolonged headaches, repeat dose of Maxalt fail to improve her symptoms, sleep always helps  UPDATE March 3rd 2016: She complains of worsening headaches almost daily, moderate, tried maxalt.without significant improvement, she is taking baclofen ER 30mg , nortriptyline 20mg  qhs. Also with bilateral knee and hip pain, left worse than right, bilateral feet numbness, coldness sensation, which are chronic.  UPDATE May 25th 2016: She is overall doing very well, no significant bilateral lower extremity spasticity over the past few weeks, baseline urinary incontinence, gradually getting worse, accident almost daily basis, she was recently evaluated by her gynecologist, planning on to see a urologist soon   UPDATE Mar 28 2015: She is with her husband daughter at today's clinical visit, we have reviewed previous MS history, she was started  on Plegridy in July 2015, had a flareup in Oct 2015, was treated with steroid, over the past 18 months, she had slow worsening gait difficulty  We  have reviewed MRI of the brain with and without contrast in November 2016:  abnormal MRI of the brain with and without contrast showing T2/FLAIR hyperintense foci at the cervicomedullary junction and within the pons, midbrain and cerebellum in the posterior fossa. Additionally, there are many T2/FLAIR hyperintense foci in the cerebral hemispheres, predominantly in the periventricular white matter. The foci are consistent with chronic demyelinating plaques from multiple sclerosis. None of the foci appears to be acute and there are no enhancing lesions.   abnormal MRI of the cervical spine showing T2 hyperintense foci within the spinal cord at the cervicomedullary junction, C2, C3, C5C6 and C6 as detailed above. Additional foci are noted within the pons. None of the foci appears to be acute. No significant degenerative changes are noted. The lesions are consistent with chronic demyelinating plaques as would be seen in multiple sclerosis. The findings are similar to the MRI report from 08/19/2013.  She still has significant better urgency, no bowel incontinence, taking baclofen ER for bilateral lower extremity spasticity,  Migraine headaches: Overall under good control she is taking nortriptyline 20 mg every night, taking Topamax as needed instead of regular 100 mg twice a day Imitrex as needed was helpful  UPDATE June 29 2015: He started [place] infusion since January 2017, she tolerated well, she has much less frequent headaches, Maxalt continued to help, will taper off Topamax, leave her on nortriptyline 20 mg every night, her bilateral lower extremity spasticity has much improved as well, on lower dose of baclofen ER 30 mg daily,  UPDATE August 16th 2017: She complains of increased bilateral lower extremity now taking baclofen 10 mg 3 times a day, also nortriptyline 20 mg every night as migraine prevention, she barely has any headaches now, tolerating Tysarbri infusion very well,  continue have baseline gait abnormality, dragging left leg more, also complains of chronic insomnia,  UPDATE Dec 19th 2017: She complains of left hip and left knee pain, continue mild gait difficulty, tolerating Tysarbri infusion well, no worsening gait abnormality  We have reviewed MRI of the brain without contrast in November 2017:Abnormal MRI scan of the brain with and without contrast showing multiple periventricular, periatrial, subcortical and brainstem white matter hyperintensities consistent with chronic demyelinating disease. No enhancing lesions are noted. Overall no significant change compared with previous MRI scan dated 03/01/2015  UPDATE Dec 6th 2018: She is overall doing very well, continue complaints of fatigue, gait abnormality, left arm and leg deep achy pain, she rarely gets migraine headaches now, take nortriptyline 20 mg at bedtime, tolerating Tysabri IV infusion every month,  I have personally reviewed MRI of the brain in 2018 comparison to 2017, there is no significant change, no contrast enhancement.   REVIEW OF SYSTEMS: Out of a complete 14 system review of symptoms, the patient complains only of the following symptoms, and all other reviewed systems are negative.  Frequent urination, urgency, joint pain, walking difficulty  ALLERGIES: Allergies  Allergen Reactions  . Sulfa Antibiotics Nausea And Vomiting    HOME MEDICATIONS: Outpatient Medications Prior to Visit  Medication Sig Dispense Refill  . atorvastatin (LIPITOR) 20 MG tablet TAKE ONE TABLET BY MOUTH DAILY    . lisinopril (PRINIVIL,ZESTRIL) 2.5 MG tablet   0  . Natalizumab (TYSABRI IV) Inject into the vein. Every 28 days    . nortriptyline (PAMELOR)  10 MG capsule Take 2 capsules (20 mg total) by mouth at bedtime. 180 capsule 4  . oxybutynin (DITROPAN-XL) 5 MG 24 hr tablet Take by mouth.    . traMADol (ULTRAM) 50 MG tablet Take 0.5-1 tablets (25-50 mg total) by mouth every 6 (six) hours as needed for  severe pain. 15 tablet 0  . acetaminophen-codeine (TYLENOL #3) 300-30 MG tablet Take by mouth every 4 (four) hours as needed for moderate pain.    Marland Kitchen HYDROcodone-acetaminophen (NORCO/VICODIN) 5-325 MG tablet Take 1 tablet by mouth every 6 (six) hours as needed for moderate pain.    Marland Kitchen ibuprofen (ADVIL,MOTRIN) 800 MG tablet Take 1 tablet (800 mg total) by mouth 3 (three) times daily. (Patient not taking: Reported on 03/20/2017) 21 tablet 0   Facility-Administered Medications Prior to Visit  Medication Dose Route Frequency Provider Last Rate Last Dose  . gadopentetate dimeglumine (MAGNEVIST) injection 20 mL  20 mL Intravenous Once PRN Levert Feinstein, MD        PAST MEDICAL HISTORY: Past Medical History:  Diagnosis Date  . Ankle fracture, right   . History of hysterectomy   . Knee joint dislocation    bilateral  . Migraines   . Multiple sclerosis (HCC)     PAST SURGICAL HISTORY: Past Surgical History:  Procedure Laterality Date  . ABDOMINAL HYSTERECTOMY    . CESAREAN SECTION    . MYOMECTOMY      FAMILY HISTORY: Family History  Problem Relation Age of Onset  . Diabetes Mother   . Osteoarthritis Unknown   . Heart disease Unknown   . Rheumatic fever Unknown   . Hypertension Unknown     SOCIAL HISTORY: Social History   Socioeconomic History  . Marital status: Married    Spouse name: [name]  . Number of children: 1  . Years of education: college  . Highest education level: Not on file  Social Needs  . Financial resource strain: Not on file  . Food insecurity - worry: Not on file  . Food insecurity - inability: Not on file  . Transportation needs - medical: Not on file  . Transportation needs - non-medical: Not on file  Occupational History    Employer: [employer]    Comment: Does not work  Tobacco Use  . Smoking status: Never Smoker  . Smokeless tobacco: Never Used  Substance and Sexual Activity  . Alcohol use: No  . Drug use: No  . Sexual activity: Not on file    Other Topics Concern  . Not on file  Social History Narrative   Patient is married. [name]).    Patient is unemployed.   Education- College education   Right handed.   Caffeine- None      PHYSICAL EXAM  Vitals:   03/20/17 1306  BP: 111/74  Pulse: 75  Resp: 18  Weight: 184 lb 8 oz (83.7 kg)  Height: 5\' 6"  (1.676 m)   Body mass index is 29.78 kg/m.   PHYSICAL EXAMNIATION:  Gen: NAD, conversant, well nourised, obese, well groomed                     Cardiovascular: Regular rate rhythm, no peripheral edema, warm, nontender. Eyes: Conjunctivae clear without exudates or hemorrhage Neck: Supple, no carotid bruits. Pulmonary: Clear to auscultation bilaterally   NEUROLOGICAL EXAM:  MENTAL STATUS: Speech:    Speech is normal; fluent and spontaneous with normal comprehension.  Cognition:     Orientation to time, place and person  Normal recent and remote memory     Normal Attention span and concentration     Normal Language, naming, repeating,spontaneous speech     Fund of knowledge   CRANIAL NERVES: CN II: Visual fields are full to confrontation. Fundoscopic exam is normal with sharp discs and no vascular changes. Pupils are round equal and briskly reactive to light. CN III, IV, VI: extraocular movement are normal. No ptosis. CN V: Facial sensation is intact to pinprick in all 3 divisions bilaterally. Corneal responses are intact.  CN VII: Face is symmetric with normal eye closure and smile. CN VIII: Hearing is normal to rubbing fingers CN IX, X: Palate elevates symmetrically. Phonation is normal. CN XI: Head turning and shoulder shrug are intact CN XII: Tongue is midline with normal movements and no atrophy.  MOTOR: There is no pronator drift of out-stretched arms. Muscle bulk and tone are normal. Muscle strength is normal.  REFLEXES: Reflexes are 2+ and symmetric at the biceps, triceps, knees, and ankles. Plantar responses are flexor.  SENSORY: Intact to  light touch, pinprick, positional and vibratory sensation are intact in fingers and toes.  COORDINATION: Rapid alternating movements and fine finger movements are intact. There is no dysmetria on finger-to-nose and heel-knee-shin.    GAIT/STANCE: Need pushing up to get up from seated position, dragging left leg, mildly unsteady.  DIAGNOSTIC DATA (LABS, IMAGING, TESTING) - I reviewed patient records, labs, notes, testing and imaging myself where available.  Lab Results  Component Value Date   WBC 8.1 10/08/2016   HGB 12.7 10/08/2016   HCT 38.9 10/08/2016   MCV 84 10/08/2016   PLT 208 10/08/2016      Component Value Date/Time   NA 142 04/02/2016 1537   K 5.3 (H) 04/02/2016 1537   CL 101 04/02/2016 1537   CO2 23 04/02/2016 1537   GLUCOSE 97 04/02/2016 1537   BUN 11 04/02/2016 1537   CREATININE 0.68 04/02/2016 1537   CALCIUM 9.7 04/02/2016 1537   PROT 7.5 04/02/2016 1537   ALBUMIN 4.4 04/02/2016 1537   AST 12 04/02/2016 1537   ALT 11 04/02/2016 1537   ALKPHOS 53 04/02/2016 1537   BILITOT 0.3 04/02/2016 1537   GFRNONAA 105 04/02/2016 1537   GFRAA 120 04/02/2016 1537    Lab Results  Component Value Date   VITAMINB12 314 08/23/2013   Lab Results  Component Value Date   TSH 0.613 03/28/2015    ASSESSMENT AND PLAN 46 y.o. year old female   Relapsing remitting multiple sclerosis Chronic migraine headaches Left side body achy pain  Continue Tysabri IV infusion every months  May taper off nortriptyline for her migraine has much improved  Cymbalta 60 mg daily for left side of body achy pain  Laboratory evaluations Levert FeinsteinYijun Analucia Hush, M.D. Ph.D.  [facility] [address] [place], [place] [address] Phone: [phone] Fax:      [phone]

## 2017-03-21 LAB — CBC WITH DIFFERENTIAL
BASOS ABS: 0 10*3/uL (ref 0.0–0.2)
BASOS: 0 %
EOS (ABSOLUTE): 0.2 10*3/uL (ref 0.0–0.4)
Eos: 3 %
HEMATOCRIT: 39.3 % (ref 34.0–46.6)
HEMOGLOBIN: 13 g/dL (ref 11.1–15.9)
IMMATURE GRANS (ABS): 0 10*3/uL (ref 0.0–0.1)
Immature Granulocytes: 0 %
LYMPHS ABS: 3.8 10*3/uL — AB (ref 0.7–3.1)
Lymphs: 45 %
MCH: 27.1 pg (ref 26.6–33.0)
MCHC: 33.1 g/dL (ref 31.5–35.7)
MCV: 82 fL (ref 79–97)
MONOCYTES: 8 %
Monocytes Absolute: 0.7 10*3/uL (ref 0.1–0.9)
NEUTROS ABS: 3.7 10*3/uL (ref 1.4–7.0)
Neutrophils: 44 %
RBC: 4.79 x10E6/uL (ref 3.77–5.28)
RDW: 15.3 % (ref 12.3–15.4)
WBC: 8.4 10*3/uL (ref 3.4–10.8)

## 2017-03-21 LAB — COMPREHENSIVE METABOLIC PANEL
A/G RATIO: 1.4 (ref 1.2–2.2)
ALBUMIN: 4.6 g/dL (ref 3.5–5.5)
ALK PHOS: 55 IU/L (ref 39–117)
ALT: 12 IU/L (ref 0–32)
AST: 14 IU/L (ref 0–40)
BILIRUBIN TOTAL: 0.3 mg/dL (ref 0.0–1.2)
BUN / CREAT RATIO: 16 (ref 9–23)
BUN: 14 mg/dL (ref 6–24)
CHLORIDE: 102 mmol/L (ref 96–106)
CO2: 23 mmol/L (ref 20–29)
Calcium: 9.7 mg/dL (ref 8.7–10.2)
Creatinine, Ser: 0.88 mg/dL (ref 0.57–1.00)
GFR calc non Af Amer: 78 mL/min/{1.73_m2} (ref >59)
GFR, EST AFRICAN AMERICAN: 90 mL/min/{1.73_m2} (ref >59)
GLOBULIN, TOTAL: 3.3 g/dL (ref 1.5–4.5)
GLUCOSE: 77 mg/dL (ref 65–99)
POTASSIUM: 4.4 mmol/L (ref 3.5–5.2)
SODIUM: 142 mmol/L (ref 134–144)
TOTAL PROTEIN: 7.9 g/dL (ref 6.0–8.5)

## 2017-03-26 ENCOUNTER — Other Ambulatory Visit: Payer: Self-pay | Admitting: *Deleted

## 2017-03-26 MED ORDER — DULOXETINE HCL 60 MG PO CPEP: 60.0000 mg | ORAL_CAPSULE | Freq: Every day | ORAL | 3 refills | Status: DC

## 2017-03-27 ENCOUNTER — Telehealth: Payer: Self-pay | Admitting: *Deleted

## 2017-03-27 NOTE — Telephone Encounter (Signed)
Labs collected 03/20/17:  JCV 0.17 negative

## 2017-04-22 ENCOUNTER — Ambulatory Visit: Payer: BLUE CROSS/BLUE SHIELD | Admitting: Adult Health

## 2017-04-23 ENCOUNTER — Telehealth: Payer: Self-pay | Admitting: *Deleted

## 2017-04-23 ENCOUNTER — Other Ambulatory Visit: Payer: Self-pay | Admitting: *Deleted

## 2017-04-23 MED ORDER — PREGABALIN 50 MG PO CAPS: 50.0000 mg | ORAL_CAPSULE | Freq: Three times a day (TID) | ORAL | 5 refills | Status: DC

## 2017-04-23 NOTE — Telephone Encounter (Signed)
Patient was in our office today for an infusion.  While here, she reported that duloxetine was unhelpful for her pain.  Dr. Terrace Arabia reviewed chart.  Per her vo, patient can discontinue the duloxetine and she has provided her with a prescription for Lyrica 50mg , TID.  Pt agreeable to this change and the prescription was faxed to the [facility] listed in her chart.

## 2017-04-30 ENCOUNTER — Telehealth: Payer: Self-pay | Admitting: *Deleted

## 2017-04-30 NOTE — Telephone Encounter (Signed)
Lyrica PA approved by [facility] [id number].  Approval valid through 04/28/2020.  Pharmacy notified and will fill for the patient.

## 2017-05-21 ENCOUNTER — Encounter: Payer: Self-pay | Admitting: *Deleted

## 2017-06-23 ENCOUNTER — Telehealth: Payer: Self-pay | Admitting: Neurology

## 2017-06-23 NOTE — Telephone Encounter (Signed)
She is currently taking Lyrica 50mg , one capsule TID with little relief.  She is tolerating the medication well without any adverse side effects.  She would like to try a higher dosage, if appropriate.

## 2017-06-23 NOTE — Telephone Encounter (Signed)
Pt called she has question about pregabalin (LYRICA) 50 MG capsule. Pt did not want to give any information.

## 2017-06-24 MED ORDER — PREGABALIN 100 MG PO CAPS: 100.0000 mg | ORAL_CAPSULE | Freq: Three times a day (TID) | ORAL | 5 refills | Status: DC

## 2017-06-24 NOTE — Telephone Encounter (Signed)
Per vo by Dr. Terrace Arabia, increase Lyrica to 100mg , one capsule TID.  Rx faxed and confirmed to [facility].  Remaining refills of 50mg  have been voided at the pharmacy.  Patient aware and agreeable to this change.

## 2017-06-24 NOTE — Addendum Note (Signed)
Addended by: Lindell Spar C on: 06/24/2017 11:50 AM   Modules accepted: Orders

## 2017-09-10 ENCOUNTER — Telehealth: Payer: Self-pay | Admitting: *Deleted

## 2017-09-10 NOTE — Telephone Encounter (Signed)
Patient stopped in to try to reschedule her appt.  It was scheduled with CDaphine Deutscher and she has never seen her.  She has only seem Megan.  There are no openings soon and she feels she cant wait until the next available.  Could Dr. Terrace Arabia see her?  Please call.

## 2017-09-10 NOTE — Telephone Encounter (Signed)
Spoke to patient.  I offered her an appt with Dr. Terrace Arabia on 09/16/17 but she was unable to come that day.  Her follow up has been moved to 09/29/17 with Dr. Terrace Arabia.

## 2017-09-15 ENCOUNTER — Ambulatory Visit: Payer: BLUE CROSS/BLUE SHIELD | Admitting: Nurse Practitioner

## 2017-09-29 ENCOUNTER — Ambulatory Visit (INDEPENDENT_AMBULATORY_CARE_PROVIDER_SITE_OTHER): Payer: BLUE CROSS/BLUE SHIELD | Admitting: Neurology

## 2017-09-29 ENCOUNTER — Encounter: Payer: Self-pay | Admitting: Neurology

## 2017-09-29 ENCOUNTER — Telehealth: Payer: Self-pay | Admitting: Neurology

## 2017-09-29 VITALS — BP 114/79 | HR 83 | Ht 66.0 in | Wt 185.0 lb

## 2017-09-29 DIAGNOSIS — R269 Unspecified abnormalities of gait and mobility: Secondary | ICD-10-CM

## 2017-09-29 DIAGNOSIS — R252 Cramp and spasm: Secondary | ICD-10-CM

## 2017-09-29 DIAGNOSIS — G35 Multiple sclerosis: Secondary | ICD-10-CM

## 2017-09-29 MED ORDER — DALFAMPRIDINE ER 10 MG PO TB12: 10.0000 mg | ORAL_TABLET | Freq: Two times a day (BID) | ORAL | 11 refills | Status: DC

## 2017-09-29 NOTE — Patient Instructions (Addendum)
ocrelizumab Dana Duke

## 2017-09-29 NOTE — Telephone Encounter (Signed)
[employer] please scheduled in Nov order sent to [facility] they will reach out to the pt to schedule.

## 2017-09-29 NOTE — Progress Notes (Signed)
PATIENT: Dana Duke DOB: 17-Aug-1970  REASON FOR VISIT: follow up- multiple sclerosis HISTORY FROM: patient-   HISTORY OF PRESENT ILLNESS: HISTORY (initial visit was in Nov 2014) Dana Duke is a 47 years old right-handed African American female, referred by Dr. Suann Larry, Janalyn Rouse, she has relapsing remitting multiple sclerosis  Since 2011 she noticed gradual onset slow worsening gait difficulty, she tends to drag her foot across the floor, stiff, has difficulty going upstairs, because of knee pain, she suffered a history of right ankle fracture in 2010, seems to dragging her right foot across the floor more, also complains urinary urgency, no bowel incontinence   MRI at [facility] in May 7th 2015, without contrast  MRI cervical spine (without) demonstrating at least 4 spinal cord T2 hyperintensities at cervico-medullary junction, C2, C3-4 and C6.  MRI brain (without) demonstrating multiple (> 20) round and ovoid, periventricular, callosal and peri-callosal, subcortical, juxtacortical, pontine and cerebellar T2 hyperintensities. Some of these are perpendicular to the lateral ventricles on axial and sagittal views. Some of these are hypointense on T1 views. Findings suspicious for demyelinating disease  CSF showed 5 oligoclonal banding, elevated IgG index   Extensive laboratory evaluations showed normal or negative CBC, CMP, Lyme titer, ANA, NMO, TSH, B12, folic acid, RPR. Positive varicellar zoster antibody  The visual evoked response test above was within normal limits on the left, with prolongation of the P100 latency on the right.   JC virus antibody was 0.15 negative in 10/18/2013  MRI of brain, and cervical spine with contrast, showed no contrast enhancement, there was no lesions noticed in MRI thoracic spine,  She started Plegridy since July 2015,  began full dose injection since Sept 15 2015. Complains of injection site reactio, lasting 2-4 weeks, she only has mild  flu like illness   She has fallen few times, more difficulty walking, when she fell few weeks ago, Sep 15th 2015, she has difficulty getting up, could not move from waist down for 2-3 hours, also noticed slurring of her speech. She has mild fever then  She called Dr. Anne Hahn, had a repeat MRI of the brain Oct 2015, we have reviewed MRI together, continued evidence of periventricular white matter disease consistent with her diagnosis of multiple sclerosis, there was no change compared to previous image in  may 2015  She was started on prednisone  tapering since October 16,2015,  tolerating the medication well, seems to have mild improvement in her gait difficulty   She also complains of frequent headaches, consistent with migraines,   UPDATE Feb 2nd 2016:  Last visit Jan 19th 2016, this is [name] 9-21 visit, she continued to have intermittent headaches, Maxalt 5 mg works most of the time, but sometimes with severe prolonged headaches, repeat dose of Maxalt fail to improve her symptoms, sleep always helps  UPDATE March 3rd 2016: She complains of worsening headaches almost daily, moderate, tried maxalt.without significant improvement, she is taking baclofen ER 30mg , nortriptyline 20mg  qhs. Also with bilateral knee and hip pain, left worse than right, bilateral feet numbness, coldness sensation, which are chronic.  UPDATE May 25th 2016: She is overall doing very well, no significant bilateral lower extremity spasticity over the past few weeks, baseline urinary incontinence, gradually getting worse, accident almost daily basis, she was recently evaluated by her gynecologist, planning on to see a urologist soon   UPDATE Mar 28 2015: She is with her husband daughter at today's clinical visit, we have reviewed previous MS history, she was started  on Plegridy in July 2015, had a flareup in Oct 2015, was treated with steroid, over the past 18 months, she had slow worsening gait difficulty  We  have reviewed MRI of the brain with and without contrast in November 2016:  abnormal MRI of the brain with and without contrast showing T2/FLAIR hyperintense foci at the cervicomedullary junction and within the pons, midbrain and cerebellum in the posterior fossa. Additionally, there are many T2/FLAIR hyperintense foci in the cerebral hemispheres, predominantly in the periventricular white matter. The foci are consistent with chronic demyelinating plaques from multiple sclerosis. None of the foci appears to be acute and there are no enhancing lesions.   abnormal MRI of the cervical spine showing T2 hyperintense foci within the spinal cord at the cervicomedullary junction, C2, C3, C5C6 and C6 as detailed above. Additional foci are noted within the pons. None of the foci appears to be acute. No significant degenerative changes are noted. The lesions are consistent with chronic demyelinating plaques as would be seen in multiple sclerosis. The findings are similar to the MRI report from 08/19/2013.  She still has significant better urgency, no bowel incontinence, taking baclofen ER for bilateral lower extremity spasticity,  Migraine headaches: Overall under good control she is taking nortriptyline 20 mg every night, taking Topamax as needed instead of regular 100 mg twice a day Imitrex as needed was helpful  UPDATE June 29 2015: She started [place] infusion since January 2017, she tolerated well, she has much less frequent headaches, Maxalt continued to help, will taper off Topamax, leave her on nortriptyline 20 mg every night, her bilateral lower extremity spasticity has much improved as well, on lower dose of baclofen ER 30 mg daily,  UPDATE August 16th 2017: She complains of increased bilateral lower extremity now taking baclofen 10 mg 3 times a day, also nortriptyline 20 mg every night as migraine prevention, she barely has any headaches now, tolerating Tysarbri infusion very well,  continue have baseline gait abnormality, dragging left leg more, also complains of chronic insomnia,  UPDATE Dec 19th 2017: She complains of left hip and left knee pain, continue mild gait difficulty, tolerating Tysarbri infusion well, no worsening gait abnormality  We have reviewed MRI of the brain without contrast in November 2017:Abnormal MRI scan of the brain with and without contrast showing multiple periventricular, periatrial, subcortical and brainstem white matter hyperintensities consistent with chronic demyelinating disease. No enhancing lesions are noted. Overall no significant change compared with previous MRI scan dated 03/01/2015  UPDATE Dec 6th 2018: She is overall doing very well, continue complaints of fatigue, gait abnormality, left arm and leg deep achy pain, she rarely gets migraine headaches now, take nortriptyline 20 mg at bedtime, tolerating Tysabri IV infusion every month,  I have personally reviewed MRI of the brain in 2018 comparison to 2017, there is no significant change, no contrast enhancement.  UPDATE September 29 2017: She complains of worsening gait abnormality, reported 50% worse compared to 6 months ago, also has worsening urinary urgency, sometimes bladder incontinence, no significant weakness in her arms, also complains of significant left knee pain, was seen by orthopedic surgeon, intra-articular joint injection provided no significant improvement  REVIEW OF SYSTEMS: Out of a complete 14 system review of symptoms, the patient complains only of the following symptoms, and all other reviewed systems are negative.  AS ABOVE.  ALLERGIES: Allergies  Allergen Reactions  . Sulfa Antibiotics Nausea And Vomiting    HOME MEDICATIONS: Outpatient Medications Prior to Visit  Medication  Sig Dispense Refill  . atorvastatin (LIPITOR) 20 MG tablet TAKE ONE TABLET BY MOUTH DAILY    . lisinopril (PRINIVIL,ZESTRIL) 2.5 MG tablet   0  . Natalizumab (TYSABRI IV) Inject into  the vein. Every 28 days    . nortriptyline (PAMELOR) 10 MG capsule Take 2 capsules (20 mg total) by mouth at bedtime. 180 capsule 4  . oxybutynin (DITROPAN-XL) 5 MG 24 hr tablet Take by mouth.    . pregabalin (LYRICA) 100 MG capsule Take 1 capsule (100 mg total) by mouth 3 (three) times daily. 90 capsule 5  . traMADol (ULTRAM) 50 MG tablet Take 0.5-1 tablets (25-50 mg total) by mouth every 6 (six) hours as needed for severe pain. 15 tablet 0  . acetaminophen-codeine (TYLENOL #3) 300-30 MG tablet Take by mouth every 4 (four) hours as needed for moderate pain.    Marland Kitchen HYDROcodone-acetaminophen (NORCO/VICODIN) 5-325 MG tablet Take 1 tablet by mouth every 6 (six) hours as needed for moderate pain.    Marland Kitchen ibuprofen (ADVIL,MOTRIN) 800 MG tablet Take 1 tablet (800 mg total) by mouth 3 (three) times daily. (Patient not taking: Reported on 03/20/2017) 21 tablet 0   Facility-Administered Medications Prior to Visit  Medication Dose Route Frequency Provider Last Rate Last Dose  . gadopentetate dimeglumine (MAGNEVIST) injection 20 mL  20 mL Intravenous Once PRN Levert Feinstein, MD        PAST MEDICAL HISTORY: Past Medical History:  Diagnosis Date  . Ankle fracture, right   . History of hysterectomy   . Knee joint dislocation    bilateral  . Migraines   . Multiple sclerosis (HCC)     PAST SURGICAL HISTORY: Past Surgical History:  Procedure Laterality Date  . ABDOMINAL HYSTERECTOMY    . CESAREAN SECTION    . MYOMECTOMY      FAMILY HISTORY: Family History  Problem Relation Age of Onset  . Diabetes Mother   . Osteoarthritis Unknown   . Heart disease Unknown   . Rheumatic fever Unknown   . Hypertension Unknown     SOCIAL HISTORY: Social History   Socioeconomic History  . Marital status: Married    Spouse name: [name]  . Number of children: 1  . Years of education: college  . Highest education level: Not on file  Occupational History    Employer:  [employer]    Comment: Does not work    Social Needs  . Financial resource strain: Not on file  . Food insecurity:    Worry: Not on file    Inability: Not on file  . Transportation needs:    Medical: Not on file    Non-medical: Not on file  Tobacco Use  . Smoking status: Never Smoker  . Smokeless tobacco: Never Used  Substance and Sexual Activity  . Alcohol use: No  . Drug use: No  . Sexual activity: Not on file  Lifestyle  . Physical activity:    Days per week: Not on file    Minutes per session: Not on file  . Stress: Not on file  Relationships  . Social connections:    Talks on phone: Not on file    Gets together: Not on file    Attends religious service: Not on file    Active member of club or organization: Not on file    Attends meetings of clubs or organizations: Not on file    Relationship status: Not on file  . Intimate partner violence:    Fear of current  or ex partner: Not on file    Emotionally abused: Not on file    Physically abused: Not on file    Forced sexual activity: Not on file  Other Topics Concern  . Not on file  Social History Narrative   Patient is married. [name]).    Patient is unemployed.   Education- College education   Right handed.   Caffeine- None      PHYSICAL EXAM  Vitals:   09/29/17 1119  BP: 114/79  Pulse: 83  Weight: 185 lb (83.9 kg)  Height: 5\' 6"  (1.676 m)   Body mass index is 29.86 kg/m.   PHYSICAL EXAMNIATION:  Gen: NAD, conversant, well nourised, obese, well groomed                     Cardiovascular: Regular rate rhythm, no peripheral edema, warm, nontender. Eyes: Conjunctivae clear without exudates or hemorrhage Neck: Supple, no carotid bruits. Pulmonary: Clear to auscultation bilaterally   NEUROLOGICAL EXAM:  MENTAL STATUS: Speech:    Speech is normal; fluent and spontaneous with normal comprehension.  Cognition:     Orientation to time, place and person     Normal recent and remote memory     Normal Attention span and concentration      Normal Language, naming, repeating,spontaneous speech     Fund of knowledge   CRANIAL NERVES: CN II: Visual fields are full to confrontation. Fundoscopic exam is normal with sharp discs and no vascular changes. Pupils are round equal and briskly reactive to light. CN III, IV, VI: extraocular movement are normal. No ptosis. CN V: Facial sensation is intact to pinprick in all 3 divisions bilaterally. Corneal responses are intact.  CN VII: Face is symmetric with normal eye closure and smile. CN VIII: Hearing is normal to rubbing fingers CN IX, X: Palate elevates symmetrically. Phonation is normal. CN XI: Head turning and shoulder shrug are intact CN XII: Tongue is midline with normal movements and no atrophy.  MOTOR: There is no pronator drift of out-stretched arms. Muscle bulk and tone are normal. Muscle strength is normal.  REFLEXES: Reflexes are 2+ and symmetric at the biceps, triceps, knees, and ankles. Plantar responses are flexor.  SENSORY: Intact to light touch, pinprick, positional and vibratory sensation are intact in fingers and toes.  COORDINATION: Rapid alternating movements and fine finger movements are intact. There is no dysmetria on finger-to-nose and heel-knee-shin.    GAIT/STANCE: Need pushing up to get up from seated position, dragging left leg, mildly unsteady.  DIAGNOSTIC DATA (LABS, IMAGING, TESTING) - I reviewed patient records, labs, notes, testing and imaging myself where available.  Lab Results  Component Value Date   WBC 8.4 03/20/2017   HGB 13.0 03/20/2017   HCT 39.3 03/20/2017   MCV 82 03/20/2017   PLT 208 10/08/2016      Component Value Date/Time   NA 142 03/20/2017 1356   K 4.4 03/20/2017 1356   CL 102 03/20/2017 1356   CO2 23 03/20/2017 1356   GLUCOSE 77 03/20/2017 1356   BUN 14 03/20/2017 1356   CREATININE 0.88 03/20/2017 1356   CALCIUM 9.7 03/20/2017 1356   PROT 7.9 03/20/2017 1356   ALBUMIN 4.6 03/20/2017 1356   AST 14 03/20/2017 1356    ALT 12 03/20/2017 1356   ALKPHOS 55 03/20/2017 1356   BILITOT 0.3 03/20/2017 1356   GFRNONAA 78 03/20/2017 1356   GFRAA 90 03/20/2017 1356    Lab Results  Component Value Date  NWGNFAOZ30 314 08/23/2013   Lab Results  Component Value Date   TSH 0.613 03/28/2015  Treatment:    Tysarbri infusion since Jan 2017.   JC virus antibody: negative 0.17 in Dec 2018.  ASSESSMENT AND PLAN 47 y.o. year old female   Relapsing remitting multiple sclerosis  MRI was in November 2018, MRI of the brain, cervical spine in November 2019  We will check JC virus titer, Tysarbri antibody   Worsening gait abnormality  25 Foot Walk today was 11.18/15.08  Start Ampyra 10 mg twice a day  Worsening urinary urgency  Was seen by urologist      Levert Feinstein, M.D. Ph.D.  [facility] [address] [place], [place] [address] Phone: [phone] Fax:      [phone]

## 2017-09-29 NOTE — Telephone Encounter (Signed)
Noted,

## 2017-09-29 NOTE — Telephone Encounter (Signed)
Patient needs to have MRIs done on Nov 2019.

## 2017-10-03 ENCOUNTER — Telehealth: Payer: Self-pay | Admitting: Neurology

## 2017-10-03 ENCOUNTER — Telehealth: Payer: Self-pay | Admitting: *Deleted

## 2017-10-03 LAB — COMPREHENSIVE METABOLIC PANEL
ALBUMIN: 4.3 g/dL (ref 3.5–5.5)
ALT: 11 IU/L (ref 0–32)
AST: 16 IU/L (ref 0–40)
Albumin/Globulin Ratio: 1.5 (ref 1.2–2.2)
Alkaline Phosphatase: 60 IU/L (ref 39–117)
BUN / CREAT RATIO: 19 (ref 9–23)
BUN: 13 mg/dL (ref 6–24)
Bilirubin Total: 0.2 mg/dL (ref 0.0–1.2)
CALCIUM: 9.3 mg/dL (ref 8.7–10.2)
CO2: 24 mmol/L (ref 20–29)
CREATININE: 0.68 mg/dL (ref 0.57–1.00)
Chloride: 105 mmol/L (ref 96–106)
GFR calc Af Amer: 119 mL/min/{1.73_m2} (ref >59)
GFR, EST NON AFRICAN AMERICAN: 103 mL/min/{1.73_m2} (ref >59)
GLOBULIN, TOTAL: 2.9 g/dL (ref 1.5–4.5)
Glucose: 98 mg/dL (ref 65–99)
Potassium: 4.6 mmol/L (ref 3.5–5.2)
SODIUM: 140 mmol/L (ref 134–144)
Total Protein: 7.2 g/dL (ref 6.0–8.5)

## 2017-10-03 LAB — CBC WITH DIFFERENTIAL/PLATELET
BASOS: 0 %
Basophils Absolute: 0 10*3/uL (ref 0.0–0.2)
EOS (ABSOLUTE): 0.2 10*3/uL (ref 0.0–0.4)
EOS: 3 %
HEMATOCRIT: 40.7 % (ref 34.0–46.6)
HEMOGLOBIN: 12.5 g/dL (ref 11.1–15.9)
IMMATURE GRANULOCYTES: 0 %
Immature Grans (Abs): 0 10*3/uL (ref 0.0–0.1)
Lymphocytes Absolute: 3.3 10*3/uL — ABNORMAL HIGH (ref 0.7–3.1)
Lymphs: 50 %
MCH: 26.1 pg — ABNORMAL LOW (ref 26.6–33.0)
MCHC: 30.7 g/dL — ABNORMAL LOW (ref 31.5–35.7)
MCV: 85 fL (ref 79–97)
MONOCYTES: 7 %
MONOS ABS: 0.5 10*3/uL (ref 0.1–0.9)
NEUTROS PCT: 40 %
Neutrophils Absolute: 2.7 10*3/uL (ref 1.4–7.0)
Platelets: 209 10*3/uL (ref 150–450)
RBC: 4.79 x10E6/uL (ref 3.77–5.28)
RDW: 15.6 % — AB (ref 12.3–15.4)
WBC: 6.7 10*3/uL (ref 3.4–10.8)

## 2017-10-03 LAB — ANTI-TYSABRI (NATALIZUMAB) AB: ANTI-TYSABRI(NATALIZUMAB) AB: NEGATIVE

## 2017-10-03 LAB — TSH: TSH: 1.13 u[IU]/mL (ref 0.450–4.500)

## 2017-10-03 NOTE — Telephone Encounter (Signed)
Spoke with Lizza and reviewed below lab results.  I also explained I completed a PA for her Ampyra this morning; just waiting on ins. response.  She verbalized understanding of same/fim

## 2017-10-03 NOTE — Telephone Encounter (Signed)
PA for Ampyra (brand or generic ok) 10mg  #60/30 completed via Cover My Meds, Key# [id number].  Dx: RRMS (G35), Gait Disturbance (R26.9). 25 ft. timed walk done 09/29/17 (11.18/15.08)/fim

## 2017-10-03 NOTE — Telephone Encounter (Signed)
Please find out her most recent JC virus antibody result and call patient  Silvestre Moment antibody was negative, rest of the laboratory evaluations showed no significant abnormality.

## 2017-10-06 ENCOUNTER — Telehealth: Payer: Self-pay | Admitting: *Deleted

## 2017-10-06 NOTE — Telephone Encounter (Signed)
Labs collected 09/29/17:  JCV ab - 0.17 (negative)

## 2017-10-07 NOTE — Telephone Encounter (Signed)
Ampyra PA approved by [employer] (pt [id number]).  Valid through 04/01/18.

## 2017-10-22 ENCOUNTER — Telehealth: Payer: Self-pay | Admitting: Neurology

## 2017-10-22 ENCOUNTER — Other Ambulatory Visit: Payer: Self-pay | Admitting: *Deleted

## 2017-10-22 MED ORDER — DALFAMPRIDINE ER 10 MG PO TB12: 10.0000 mg | ORAL_TABLET | Freq: Two times a day (BID) | ORAL | 3 refills | Status: DC

## 2017-10-22 NOTE — Telephone Encounter (Signed)
Pt requesting a call to discuss dalfampridine 10 MG TB12 and what the insurance company mentioned to her. Pt did not wish to discuss further with me

## 2017-10-22 NOTE — Telephone Encounter (Signed)
Spoke to [employer] - she needs the Ampyra rx sent to [facility].  She also plans to apply for co-pay assistance with Ampyra.  I provided her with the phone number for both pharmacy and Ampyra.  She will call me back if she runs into any problems.

## 2017-10-23 ENCOUNTER — Telehealth: Payer: Self-pay | Admitting: Neurology

## 2017-10-23 ENCOUNTER — Other Ambulatory Visit: Payer: Self-pay | Admitting: *Deleted

## 2017-10-23 MED ORDER — DALFAMPRIDINE ER 10 MG PO TB12: 10.0000 mg | ORAL_TABLET | Freq: Two times a day (BID) | ORAL | 3 refills | Status: DC

## 2017-10-23 MED ORDER — AMPYRA 10 MG PO TB12: 10.0000 mg | ORAL_TABLET | Freq: Two times a day (BID) | ORAL | 3 refills | Status: DC

## 2017-10-23 NOTE — Telephone Encounter (Signed)
Error

## 2017-10-23 NOTE — Telephone Encounter (Signed)
Patient has requested for Dr. Zannie Cove nurse to call her to discuss paper work.

## 2017-10-23 NOTE — Telephone Encounter (Signed)
12 wk botox inj °

## 2017-10-23 NOTE — Telephone Encounter (Addendum)
Returned call. Patient requesting prescription be sent in for name brand Ampyra so she will qualify for patient assistance.  Request completed.

## 2017-10-27 NOTE — Telephone Encounter (Signed)
This message was sent in error. Sorry!

## 2017-10-27 NOTE — Telephone Encounter (Signed)
Dana Duke, is this for an infusion or an injection? I dont see where this patient receives Botox?

## 2017-10-31 MED ORDER — DALFAMPRIDINE ER 10 MG PO TB12: 10.0000 mg | ORAL_TABLET | Freq: Two times a day (BID) | ORAL | 11 refills | Status: DC

## 2017-10-31 NOTE — Telephone Encounter (Signed)
Pt said she was approved PAP for $1000, medication would then cost her $2000.  Patient is asking script for generic to be sent instead. Pt is aware the clinic closes at noon today

## 2017-10-31 NOTE — Addendum Note (Signed)
Addended by: Candis Schatz I on: 10/31/2017 11:06 AM   Modules accepted: Orders

## 2017-10-31 NOTE — Telephone Encounter (Signed)
Rx. for Dalfampridine escribed to [facility] as requested/fim

## 2018-01-01 ENCOUNTER — Other Ambulatory Visit: Payer: Self-pay | Admitting: Family Medicine

## 2018-01-01 DIAGNOSIS — Z1231 Encounter for screening mammogram for malignant neoplasm of breast: Secondary | ICD-10-CM

## 2018-01-07 ENCOUNTER — Telehealth: Payer: Self-pay | Admitting: Neurology

## 2018-01-07 NOTE — Telephone Encounter (Signed)
Spoke to patient - she has an upcoming follow up to be evaluated for a power wheelchair. She is going to drop the paperwork off that will need to be addressed the day of her appt.  There is no additional charge for power wheelchair ppw.

## 2018-01-07 NOTE — Telephone Encounter (Signed)
Pt requesting a call to discuss upcoming appt, did not wish to discuss further.

## 2018-01-08 NOTE — Telephone Encounter (Signed)
Spoke to patient - she had to move her wheelchair evaluation appt to a sooner date in order to get it before the end of the year.  Her MRI scans are not scheduled until 02/21/18.  She is aware that we will just call her once we have the results.  A second appt will not be necessary.

## 2018-01-08 NOTE — Telephone Encounter (Signed)
Pt requesting a call stating there was an issue with her appt. Did not wish to discuss with me.

## 2018-01-29 ENCOUNTER — Telehealth: Payer: Self-pay | Admitting: *Deleted

## 2018-01-29 ENCOUNTER — Ambulatory Visit (INDEPENDENT_AMBULATORY_CARE_PROVIDER_SITE_OTHER): Payer: BLUE CROSS/BLUE SHIELD | Admitting: Neurology

## 2018-01-29 ENCOUNTER — Encounter: Payer: Self-pay | Admitting: Neurology

## 2018-01-29 VITALS — BP 128/86 | HR 90 | Ht 66.0 in | Wt 192.0 lb

## 2018-01-29 DIAGNOSIS — G35 Multiple sclerosis: Secondary | ICD-10-CM

## 2018-01-29 DIAGNOSIS — R269 Unspecified abnormalities of gait and mobility: Secondary | ICD-10-CM | POA: Diagnosis not present

## 2018-01-29 NOTE — Telephone Encounter (Signed)
Patient is attempting to get a power wheelchair or power scooter through [facility].  We have the prescription here and Dr. Terrace Arabia has completed her face-to-face evaluation that her insurance company requires.   There are a few questions that need to be clarified from the company.  I have left a voicemail requesting a phone call back.

## 2018-01-29 NOTE — Progress Notes (Signed)
PATIENT: Dana Duke DOB: 17-Aug-1970  REASON FOR VISIT: follow up- multiple sclerosis HISTORY FROM: patient-   HISTORY OF PRESENT ILLNESS: HISTORY (initial visit was in Nov 2014) Dana Duke is a 47 years old right-handed African American female, referred by Dr. Suann Larry, Janalyn Rouse, she has relapsing remitting multiple sclerosis  Since 2011 she noticed gradual onset slow worsening gait difficulty, she tends to drag her foot across the floor, stiff, has difficulty going upstairs, because of knee pain, she suffered a history of right ankle fracture in 2010, seems to dragging her right foot across the floor more, also complains urinary urgency, no bowel incontinence   MRI at [facility] in May 7th 2015, without contrast  MRI cervical spine (without) demonstrating at least 4 spinal cord T2 hyperintensities at cervico-medullary junction, C2, C3-4 and C6.  MRI brain (without) demonstrating multiple (> 20) round and ovoid, periventricular, callosal and peri-callosal, subcortical, juxtacortical, pontine and cerebellar T2 hyperintensities. Some of these are perpendicular to the lateral ventricles on axial and sagittal views. Some of these are hypointense on T1 views. Findings suspicious for demyelinating disease  CSF showed 5 oligoclonal banding, elevated IgG index   Extensive laboratory evaluations showed normal or negative CBC, CMP, Lyme titer, ANA, NMO, TSH, B12, folic acid, RPR. Positive varicellar zoster antibody  The visual evoked response test above was within normal limits on the left, with prolongation of the P100 latency on the right.   JC virus antibody was 0.15 negative in 10/18/2013  MRI of brain, and cervical spine with contrast, showed no contrast enhancement, there was no lesions noticed in MRI thoracic spine,  She started Plegridy since July 2015,  began full dose injection since Sept 15 2015. Complains of injection site reactio, lasting 2-4 weeks, she only has mild  flu like illness   She has fallen few times, more difficulty walking, when she fell few weeks ago, Sep 15th 2015, she has difficulty getting up, could not move from waist down for 2-3 hours, also noticed slurring of her speech. She has mild fever then  She called Dr. Anne Hahn, had a repeat MRI of the brain Oct 2015, we have reviewed MRI together, continued evidence of periventricular white matter disease consistent with her diagnosis of multiple sclerosis, there was no change compared to previous image in  may 2015  She was started on prednisone  tapering since October 16,2015,  tolerating the medication well, seems to have mild improvement in her gait difficulty   She also complains of frequent headaches, consistent with migraines,   UPDATE Feb 2nd 2016:  Last visit Jan 19th 2016, this is [name] 9-21 visit, she continued to have intermittent headaches, Maxalt 5 mg works most of the time, but sometimes with severe prolonged headaches, repeat dose of Maxalt fail to improve her symptoms, sleep always helps  UPDATE March 3rd 2016: She complains of worsening headaches almost daily, moderate, tried maxalt.without significant improvement, she is taking baclofen ER 30mg , nortriptyline 20mg  qhs. Also with bilateral knee and hip pain, left worse than right, bilateral feet numbness, coldness sensation, which are chronic.  UPDATE May 25th 2016: She is overall doing very well, no significant bilateral lower extremity spasticity over the past few weeks, baseline urinary incontinence, gradually getting worse, accident almost daily basis, she was recently evaluated by her gynecologist, planning on to see a urologist soon   UPDATE Mar 28 2015: She is with her husband daughter at today's clinical visit, we have reviewed previous MS history, she was started  on Plegridy in July 2015, had a flareup in Oct 2015, was treated with steroid, over the past 18 months, she had slow worsening gait difficulty  We  have reviewed MRI of the brain with and without contrast in November 2016:  abnormal MRI of the brain with and without contrast showing T2/FLAIR hyperintense foci at the cervicomedullary junction and within the pons, midbrain and cerebellum in the posterior fossa. Additionally, there are many T2/FLAIR hyperintense foci in the cerebral hemispheres, predominantly in the periventricular white matter. The foci are consistent with chronic demyelinating plaques from multiple sclerosis. None of the foci appears to be acute and there are no enhancing lesions.   abnormal MRI of the cervical spine showing T2 hyperintense foci within the spinal cord at the cervicomedullary junction, C2, C3, C5C6 and C6 as detailed above. Additional foci are noted within the pons. None of the foci appears to be acute. No significant degenerative changes are noted. The lesions are consistent with chronic demyelinating plaques as would be seen in multiple sclerosis. The findings are similar to the MRI report from 08/19/2013.  She still has significant better urgency, no bowel incontinence, taking baclofen ER for bilateral lower extremity spasticity,  Migraine headaches: Overall under good control she is taking nortriptyline 20 mg every night, taking Topamax as needed instead of regular 100 mg twice a day Imitrex as needed was helpful  UPDATE June 29 2015: She started [place] infusion since January 2017, she tolerated well, she has much less frequent headaches, Maxalt continued to help, will taper off Topamax, leave her on nortriptyline 20 mg every night, her bilateral lower extremity spasticity has much improved as well, on lower dose of baclofen ER 30 mg daily,  UPDATE August 16th 2017: She complains of increased bilateral lower extremity now taking baclofen 10 mg 3 times a day, also nortriptyline 20 mg every night as migraine prevention, she barely has any headaches now, tolerating Tysarbri infusion very well,  continue have baseline gait abnormality, dragging left leg more, also complains of chronic insomnia,  UPDATE Dec 19th 2017: She complains of left hip and left knee pain, continue mild gait difficulty, tolerating Tysarbri infusion well, no worsening gait abnormality  We have reviewed MRI of the brain without contrast in November 2017:Abnormal MRI scan of the brain with and without contrast showing multiple periventricular, periatrial, subcortical and brainstem white matter hyperintensities consistent with chronic demyelinating disease. No enhancing lesions are noted. Overall no significant change compared with previous MRI scan dated 03/01/2015  UPDATE Dec 6th 2018: She is overall doing very well, continue complaints of fatigue, gait abnormality, left arm and leg deep achy pain, she rarely gets migraine headaches now, take nortriptyline 20 mg at bedtime, tolerating Tysabri IV infusion every month,  I have personally reviewed MRI of the brain in 2018 comparison to 2017, there is no significant change, no contrast enhancement.  UPDATE September 29 2017: She complains of worsening gait abnormality, reported 50% worse compared to 6 months ago, also has worsening urinary urgency, sometimes bladder incontinence, no significant weakness in her arms, also complains of significant left knee pain, was seen by orthopedic surgeon, intra-articular joint injection provided no significant improvement  UPDATE Jan 29 2018: Patient came in earlier than expected for gait abnormality, failed to respond well to Ampyra,  I reviewed the laboratory evaluation in June 2019, normal negative TSH, CMP, anti-Tysabri antibody, negative JC virus titer, CBC showed no significant abnormality.  She hoped to be evaluated for electronic motorized wheelchair, she ambulate  without assistant most of the time, but does complains of lack of stamina, tendency to fall,  REVIEW OF SYSTEMS: Out of a complete 14 system review of symptoms, the  patient complains only of the following symptoms, and all other reviewed systems are negative.  AS ABOVE.  ALLERGIES: Allergies  Allergen Reactions  . Sulfa Antibiotics Nausea And Vomiting    HOME MEDICATIONS: Outpatient Medications Prior to Visit  Medication Sig Dispense Refill  . atorvastatin (LIPITOR) 20 MG tablet TAKE ONE TABLET BY MOUTH DAILY    . dalfampridine 10 MG TB12 Take 1 tablet (10 mg total) by mouth 2 (two) times daily. 60 tablet 11  . lisinopril (PRINIVIL,ZESTRIL) 2.5 MG tablet   0  . Natalizumab (TYSABRI IV) Inject into the vein. Every 28 days    . nortriptyline (PAMELOR) 10 MG capsule Take 2 capsules (20 mg total) by mouth at bedtime. 180 capsule 4  . oxybutynin (DITROPAN-XL) 5 MG 24 hr tablet Take by mouth.    . pregabalin (LYRICA) 100 MG capsule Take 1 capsule (100 mg total) by mouth 3 (three) times daily. 90 capsule 5  . traMADol (ULTRAM) 50 MG tablet Take 0.5-1 tablets (25-50 mg total) by mouth every 6 (six) hours as needed for severe pain. 15 tablet 0   Facility-Administered Medications Prior to Visit  Medication Dose Route Frequency Provider Last Rate Last Dose  . gadopentetate dimeglumine (MAGNEVIST) injection 20 mL  20 mL Intravenous Once PRN Levert Feinstein, MD        PAST MEDICAL HISTORY: Past Medical History:  Diagnosis Date  . Ankle fracture, right   . History of hysterectomy   . Knee joint dislocation    bilateral  . Migraines   . Multiple sclerosis (HCC)     PAST SURGICAL HISTORY: Past Surgical History:  Procedure Laterality Date  . ABDOMINAL HYSTERECTOMY    . CESAREAN SECTION    . MYOMECTOMY      FAMILY HISTORY: Family History  Problem Relation Age of Onset  . Diabetes Mother   . Osteoarthritis Unknown   . Heart disease Unknown   . Rheumatic fever Unknown   . Hypertension Unknown     SOCIAL HISTORY: Social History   Socioeconomic History  . Marital status: Married    Spouse name: [name]  . Number of children: 1  . Years of  education: college  . Highest education level: Not on file  Occupational History    Employer:  [employer]    Comment: Does not work  Social Needs  . Financial resource strain: Not on file  . Food insecurity:    Worry: Not on file    Inability: Not on file  . Transportation needs:    Medical: Not on file    Non-medical: Not on file  Tobacco Use  . Smoking status: Never Smoker  . Smokeless tobacco: Never Used  Substance and Sexual Activity  . Alcohol use: No  . Drug use: No  . Sexual activity: Not on file  Lifestyle  . Physical activity:    Days per week: Not on file    Minutes per session: Not on file  . Stress: Not on file  Relationships  . Social connections:    Talks on phone: Not on file    Gets together: Not on file    Attends religious service: Not on file    Active member of club or organization: Not on file    Attends meetings of clubs or organizations: Not on file  Relationship status: Not on file  . Intimate partner violence:    Fear of current or ex partner: Not on file    Emotionally abused: Not on file    Physically abused: Not on file    Forced sexual activity: Not on file  Other Topics Concern  . Not on file  Social History Narrative   Patient is married. [name]).    Patient is unemployed.   Education- College education   Right handed.   Caffeine- None      PHYSICAL EXAM  Vitals:   01/29/18 0851  Height: 5\' 6"  (1.676 m)   Body mass index is 29.86 kg/m.   PHYSICAL EXAMNIATION:  Gen: NAD, conversant, well nourised, obese, well groomed                     Cardiovascular: Regular rate rhythm, no peripheral edema, warm, nontender. Eyes: Conjunctivae clear without exudates or hemorrhage Neck: Supple, no carotid bruits. Pulmonary: Clear to auscultation bilaterally   NEUROLOGICAL EXAM:  MENTAL STATUS: Speech:    Speech is normal; fluent and spontaneous with normal comprehension.  Cognition:     Orientation to time, place and  person     Normal recent and remote memory     Normal Attention span and concentration     Normal Language, naming, repeating,spontaneous speech     Fund of knowledge   CRANIAL NERVES: CN II: Visual fields are full to confrontation. Fundoscopic exam is normal with sharp discs and no vascular changes. Pupils are round equal and briskly reactive to light. CN III, IV, VI: extraocular movement are normal. No ptosis. CN V: Facial sensation is intact to pinprick in all 3 divisions bilaterally. Corneal responses are intact.  CN VII: Face is symmetric with normal eye closure and smile. CN VIII: Hearing is normal to rubbing fingers CN IX, X: Palate elevates symmetrically. Phonation is normal. CN XI: Head turning and shoulder shrug are intact CN XII: Tongue is midline with normal movements and no atrophy.  MOTOR: There is no pronator drift of out-stretched arms. Muscle bulk and tone are normal. Muscle strength is normal.  REFLEXES: Reflexes are 2+ and symmetric at the biceps, triceps, knees, and ankles. Plantar responses are flexor.  SENSORY: Intact to light touch, pinprick, positional and vibratory sensation are intact in fingers and toes.  COORDINATION: Rapid alternating movements and fine finger movements are intact. There is no dysmetria on finger-to-nose and heel-knee-shin.    GAIT/STANCE: Need pushing up to get up from seated position, dragging left leg, mildly unsteady.  DIAGNOSTIC DATA (LABS, IMAGING, TESTING) - I reviewed patient records, labs, notes, testing and imaging myself where available.  Lab Results  Component Value Date   WBC 6.7 09/29/2017   HGB 12.5 09/29/2017   HCT 40.7 09/29/2017   MCV 85 09/29/2017   PLT 209 09/29/2017      Component Value Date/Time   NA 140 09/29/2017 1301   K 4.6 09/29/2017 1301   CL 105 09/29/2017 1301   CO2 24 09/29/2017 1301   GLUCOSE 98 09/29/2017 1301   BUN 13 09/29/2017 1301   CREATININE 0.68 09/29/2017 1301   CALCIUM 9.3  09/29/2017 1301   PROT 7.2 09/29/2017 1301   ALBUMIN 4.3 09/29/2017 1301   AST 16 09/29/2017 1301   ALT 11 09/29/2017 1301   ALKPHOS 60 09/29/2017 1301   BILITOT 0.2 09/29/2017 1301   GFRNONAA 103 09/29/2017 1301   GFRAA 119 09/29/2017 1301    Lab Results  Component Value Date   VITAMINB12 314 08/23/2013   Lab Results  Component Value Date   TSH 1.130 09/29/2017  Treatment:    Tysarbri infusion since Jan 2017.   JC virus antibody: negative 0.17 in Dec 2018.  ASSESSMENT AND PLAN 47 y.o. year old female   Relapsing remitting multiple sclerosis    Tysarbri infusion since Jan 2017.  JC virus antibody: negative 0.17 in June 2019  Will have repeat MRI in November 2019    Worsening gait abnormality  25 Foot Walk today was 11.18/15.08  Continue Ampyra 10 mg twice a day since July,      Face to face time was 25 minutes, greater than 50% of the time was spent in counseling and coordination of care with the patient.    Levert Feinstein, M.D. Ph.D.  [facility] [address] [place], [place] [address] Phone: [phone] Fax:      [phone]

## 2018-02-03 NOTE — Telephone Encounter (Signed)
I have spoken with Yetta Glassman at [employer].  The patient does not meet criteria for a power wheelchair or scooter at this time.    I have called the patient to let her know.  It was recommended by Dr. Terrace Arabia, at her last visit, to purchase a transport wheelchair to have in case she needs it for long distances.  She is currently able to walk short distance without any assistive devices.  She also has a rollator at home, if needed.  She plans to apply for [employer] to help with her weekly transportation.  Once she completes the patient portion and signs the medical release form, she will bring it to our office for Dr. Terrace Arabia to complete the provider section.

## 2018-02-17 ENCOUNTER — Telehealth: Payer: Self-pay | Admitting: Neurology

## 2018-02-17 NOTE — Telephone Encounter (Signed)
[employer]BS Auth: [id number] (exp. 02/17/18 to 03/18/18) patient is scheduled at GI for 02/21/18.

## 2018-02-21 ENCOUNTER — Ambulatory Visit
Admission: RE | Admit: 2018-02-21 | Discharge: 2018-02-21 | Disposition: A | Discharge status: Home / Self Care | Payer: BLUE CROSS/BLUE SHIELD | Source: Ambulatory Visit | Attending: Neurology | Admitting: Neurology

## 2018-02-21 DIAGNOSIS — G35 Multiple sclerosis: Secondary | ICD-10-CM

## 2018-02-21 MED ORDER — GADOBENATE DIMEGLUMINE 529 MG/ML IV SOLN
18.0000 mL | Freq: Once | INTRAVENOUS | Status: AC | PRN
  Administered 2018-02-21: 18 mL via INTRAVENOUS

## 2018-02-23 ENCOUNTER — Telehealth: Payer: Self-pay | Admitting: Neurology

## 2018-02-23 ENCOUNTER — Ambulatory Visit: Payer: Self-pay | Admitting: Neurology

## 2018-02-23 NOTE — Telephone Encounter (Signed)
Spoke to patient - she is aware of her MRI results and verbalized understanding. 

## 2018-02-23 NOTE — Telephone Encounter (Signed)
Please call patient, repeat MRI of the brain showed no change compared to previous study, continue with evidence of MS lesions at cervical spine,  IMPRESSION:   MRI cervical spine (with and without) demonstrating: - Chronic demyelinating plaques at C2, C3, C5-6 and C7 levels. - No acute findings. - No change from MRI on 03/01/15.   MRI brain (with and without) demonstrating: - Few small round periventricular and subcortical and pontine and cerebellar chronic demyelinating plaques. - No acute findings. - No change from MRI on 02/13/17.

## 2018-02-26 ENCOUNTER — Other Ambulatory Visit: Payer: Self-pay | Admitting: *Deleted

## 2018-02-26 ENCOUNTER — Telehealth: Payer: Self-pay | Admitting: *Deleted

## 2018-02-26 MED ORDER — GABAPENTIN 300 MG PO CAPS: 300.0000 mg | ORAL_CAPSULE | Freq: Three times a day (TID) | ORAL | 11 refills | Status: DC

## 2018-02-26 NOTE — Telephone Encounter (Addendum)
Spoke to patient - her pain in her bilateral hips and legs have become worse.  She is asking if Dr. Terrace Arabia would be agreeable to providing her with a prescription for gabapentin.  Per vo by Dr. Terrace Arabia, ok to provided gabapentin 300mg , one capsule TID.  The patient is aware and agreeable.  It was recommended she start the medication at bedtime first and slowly titrate up her dose.  She verbalized understanding.  Additionally, her [employer] application has been faxed in for her.  She is aware to contact them to schedule her in-person interview.

## 2018-03-02 ENCOUNTER — Ambulatory Visit
Admission: RE | Admit: 2018-03-02 | Discharge: 2018-03-02 | Disposition: A | Discharge status: Home / Self Care | Payer: BLUE CROSS/BLUE SHIELD | Source: Ambulatory Visit | Attending: Family Medicine | Admitting: Family Medicine

## 2018-03-02 ENCOUNTER — Ambulatory Visit: Payer: BLUE CROSS/BLUE SHIELD

## 2018-03-02 ENCOUNTER — Ambulatory Visit: Payer: BLUE CROSS/BLUE SHIELD | Admitting: Neurology

## 2018-03-02 DIAGNOSIS — Z1231 Encounter for screening mammogram for malignant neoplasm of breast: Secondary | ICD-10-CM

## 2018-03-18 NOTE — Telephone Encounter (Addendum)
Patient reports [employer] was missing page 3 of part B on the application recently sent.  I pulled the paperwork and that page was included in the original fax.  I sent the application again.  Once I received fax confirmation, I called and left a message for [employer] Toni Amend Rorie at [phone]) letting them know it was resent.  I provided our number to call if anything is missing.

## 2018-03-18 NOTE — Telephone Encounter (Signed)
Pt requesting a call stating there had been issues with the application. Pt did not wish to discus further with me.

## 2018-03-24 ENCOUNTER — Telehealth: Payer: Self-pay | Admitting: *Deleted

## 2018-03-24 NOTE — Telephone Encounter (Addendum)
The patient was in our office today.  She has still not received a call from [employer] to schedule an in-person interview. I called them while she was here today and spoke to [employer] (direct [phone]).  She was unable to locate the complete application that has been faxed to them twice now.  It was faxed a third time and she will call me to confirm receipt, once it has been received.   Note from 03/18/18:  Patient reports [employer] was missing page 3 of part B on the application recently sent.  I pulled the paperwork and that page was included in the original fax.  I sent the application again.  Once I received fax confirmation, I called and left a message for [employer] Toni Amend Rorie at [phone]) letting them know it was resent.  I provided our number to call if anything is missing.

## 2018-03-24 NOTE — Telephone Encounter (Signed)
I called Dana Duke again at [facility] and she confirmed the completed application has been received.  It will be reviewed then Dana Duke will contact the patient to schedule the in-person interview.  The patient is aware to expect the call.

## 2018-04-10 ENCOUNTER — Telehealth: Payer: Self-pay | Admitting: *Deleted

## 2018-04-10 NOTE — Telephone Encounter (Addendum)
PA for Ampyra started through covermemeds (key: [id number]).  Patient has coverage with BCBS [id number]).  Decision pending.

## 2018-04-13 NOTE — Telephone Encounter (Signed)
Ampyra approved by [facility] [phone]).  Valid through 04/14/2038.  Ref#[id number].

## 2018-04-21 ENCOUNTER — Telehealth: Payer: Self-pay | Admitting: Adult Health

## 2018-04-21 NOTE — Telephone Encounter (Signed)
pt has called for the intrafusion suite, call transferred °

## 2018-04-21 NOTE — Telephone Encounter (Signed)
pt has called again for the intrafusion suite, call transferred

## 2018-04-28 ENCOUNTER — Telehealth: Payer: Self-pay | Admitting: Neurology

## 2018-04-28 NOTE — Telephone Encounter (Signed)
Pt called to be transferred to the Infusion Suite.

## 2018-04-29 ENCOUNTER — Telehealth: Payer: Self-pay | Admitting: Adult Health

## 2018-04-29 NOTE — Telephone Encounter (Signed)
pt has called for the intrafusion suite, call transferred °

## 2018-05-15 ENCOUNTER — Other Ambulatory Visit: Payer: Self-pay | Admitting: *Deleted

## 2018-05-15 ENCOUNTER — Telehealth: Payer: Self-pay | Admitting: Neurology

## 2018-05-15 DIAGNOSIS — Z79899 Other long term (current) drug therapy: Secondary | ICD-10-CM

## 2018-05-15 DIAGNOSIS — G35 Multiple sclerosis: Secondary | ICD-10-CM

## 2018-05-15 NOTE — Telephone Encounter (Signed)
Pt is asking for a call from RN Marcelino Duster to discuss if she has had a certain test for her MS please call

## 2018-05-15 NOTE — Telephone Encounter (Signed)
I called pt. She needs to get JCV ab lab test done. Her last one done 09/2017. She will come Monday between 9-11am. I placed her on lab scheduled and put orders in Epic.

## 2018-05-15 NOTE — Telephone Encounter (Signed)
pt has called for the intrafusion suite, call transferred °

## 2018-05-18 ENCOUNTER — Other Ambulatory Visit (INDEPENDENT_AMBULATORY_CARE_PROVIDER_SITE_OTHER): Payer: Self-pay

## 2018-05-18 DIAGNOSIS — G35 Multiple sclerosis: Secondary | ICD-10-CM

## 2018-05-18 DIAGNOSIS — Z79899 Other long term (current) drug therapy: Secondary | ICD-10-CM

## 2018-05-18 DIAGNOSIS — Z0289 Encounter for other administrative examinations: Secondary | ICD-10-CM

## 2018-05-20 ENCOUNTER — Ambulatory Visit: Payer: BLUE CROSS/BLUE SHIELD | Admitting: Neurology

## 2018-05-25 ENCOUNTER — Other Ambulatory Visit: Payer: Self-pay

## 2018-05-25 ENCOUNTER — Encounter: Payer: Self-pay | Admitting: Neurology

## 2018-05-25 ENCOUNTER — Ambulatory Visit (INDEPENDENT_AMBULATORY_CARE_PROVIDER_SITE_OTHER): Payer: BLUE CROSS/BLUE SHIELD | Admitting: Neurology

## 2018-05-25 ENCOUNTER — Telehealth: Payer: Self-pay | Admitting: *Deleted

## 2018-05-25 VITALS — BP 125/83 | HR 75 | Resp 16 | Ht 66.0 in | Wt 186.0 lb

## 2018-05-25 DIAGNOSIS — G43009 Migraine without aura, not intractable, without status migrainosus: Secondary | ICD-10-CM

## 2018-05-25 DIAGNOSIS — R269 Unspecified abnormalities of gait and mobility: Secondary | ICD-10-CM | POA: Diagnosis not present

## 2018-05-25 DIAGNOSIS — G35 Multiple sclerosis: Secondary | ICD-10-CM

## 2018-05-25 MED ORDER — LAMOTRIGINE 25 MG PO TABS: 50.0000 mg | ORAL_TABLET | Freq: Every day | ORAL | 6 refills | Status: DC

## 2018-05-25 NOTE — Telephone Encounter (Signed)
Lab collected 05/18/2018:  JCV ab: 0.21 (H) Indeterminate  Inhibition Assay:  Negative  Copy of results sent to scanning.

## 2018-05-25 NOTE — Progress Notes (Signed)
PATIENT: Dana Duke DOB: 17-Aug-1970  REASON FOR VISIT: follow up- multiple sclerosis HISTORY FROM: patient-   HISTORY OF PRESENT ILLNESS: HISTORY (initial visit was in Nov 2014) Dana Duke is a 48 years old right-handed African American female, referred by Dr. Suann Larry, Janalyn Rouse, she has relapsing remitting multiple sclerosis  Since 2011 she noticed gradual onset slow worsening gait difficulty, she tends to drag her foot across the floor, stiff, has difficulty going upstairs, because of knee pain, she suffered a history of right ankle fracture in 2010, seems to dragging her right foot across the floor more, also complains urinary urgency, no bowel incontinence   MRI at [facility] in May 7th 2015, without contrast  MRI cervical spine (without) demonstrating at least 4 spinal cord T2 hyperintensities at cervico-medullary junction, C2, C3-4 and C6.  MRI brain (without) demonstrating multiple (> 20) round and ovoid, periventricular, callosal and peri-callosal, subcortical, juxtacortical, pontine and cerebellar T2 hyperintensities. Some of these are perpendicular to the lateral ventricles on axial and sagittal views. Some of these are hypointense on T1 views. Findings suspicious for demyelinating disease  CSF showed 5 oligoclonal banding, elevated IgG index   Extensive laboratory evaluations showed normal or negative CBC, CMP, Lyme titer, ANA, NMO, TSH, B12, folic acid, RPR. Positive varicellar zoster antibody  The visual evoked response test above was within normal limits on the left, with prolongation of the P100 latency on the right.   JC virus antibody was 0.15 negative in 10/18/2013  MRI of brain, and cervical spine with contrast, showed no contrast enhancement, there was no lesions noticed in MRI thoracic spine,  She started Plegridy since July 2015,  began full dose injection since Sept 15 2015. Complains of injection site reactio, lasting 2-4 weeks, she only has mild  flu like illness   She has fallen few times, more difficulty walking, when she fell few weeks ago, Sep 15th 2015, she has difficulty getting up, could not move from waist down for 2-3 hours, also noticed slurring of her speech. She has mild fever then  She called Dr. Anne Hahn, had a repeat MRI of the brain Oct 2015, we have reviewed MRI together, continued evidence of periventricular white matter disease consistent with her diagnosis of multiple sclerosis, there was no change compared to previous image in  may 2015  She was started on prednisone  tapering since October 16,2015,  tolerating the medication well, seems to have mild improvement in her gait difficulty   She also complains of frequent headaches, consistent with migraines,   UPDATE Feb 2nd 2016:  Last visit Jan 19th 2016, this is [name] 9-21 visit, she continued to have intermittent headaches, Maxalt 5 mg works most of the time, but sometimes with severe prolonged headaches, repeat dose of Maxalt fail to improve her symptoms, sleep always helps  UPDATE March 3rd 2016: She complains of worsening headaches almost daily, moderate, tried maxalt.without significant improvement, she is taking baclofen ER 30mg , nortriptyline 20mg  qhs. Also with bilateral knee and hip pain, left worse than right, bilateral feet numbness, coldness sensation, which are chronic.  UPDATE May 25th 2016: She is overall doing very well, no significant bilateral lower extremity spasticity over the past few weeks, baseline urinary incontinence, gradually getting worse, accident almost daily basis, she was recently evaluated by her gynecologist, planning on to see a urologist soon   UPDATE Mar 28 2015: She is with her husband daughter at today's clinical visit, we have reviewed previous MS history, she was started  on Plegridy in July 2015, had a flareup in Oct 2015, was treated with steroid, over the past 18 months, she had slow worsening gait difficulty  We  have reviewed MRI of the brain with and without contrast in November 2016:  abnormal MRI of the brain with and without contrast showing T2/FLAIR hyperintense foci at the cervicomedullary junction and within the pons, midbrain and cerebellum in the posterior fossa. Additionally, there are many T2/FLAIR hyperintense foci in the cerebral hemispheres, predominantly in the periventricular white matter. The foci are consistent with chronic demyelinating plaques from multiple sclerosis. None of the foci appears to be acute and there are no enhancing lesions.   abnormal MRI of the cervical spine showing T2 hyperintense foci within the spinal cord at the cervicomedullary junction, C2, C3, C5C6 and C6 as detailed above. Additional foci are noted within the pons. None of the foci appears to be acute. No significant degenerative changes are noted. The lesions are consistent with chronic demyelinating plaques as would be seen in multiple sclerosis. The findings are similar to the MRI report from 08/19/2013.  She still has significant better urgency, no bowel incontinence, taking baclofen ER for bilateral lower extremity spasticity,  Migraine headaches: Overall under good control she is taking nortriptyline 20 mg every night, taking Topamax as needed instead of regular 100 mg twice a day Imitrex as needed was helpful  UPDATE June 29 2015: She started [place] infusion since January 2017, she tolerated well, she has much less frequent headaches, Maxalt continued to help, will taper off Topamax, leave her on nortriptyline 20 mg every night, her bilateral lower extremity spasticity has much improved as well, on lower dose of baclofen ER 30 mg daily,  UPDATE August 16th 2017: She complains of increased bilateral lower extremity now taking baclofen 10 mg 3 times a day, also nortriptyline 20 mg every night as migraine prevention, she barely has any headaches now, tolerating Tysarbri infusion very well,  continue have baseline gait abnormality, dragging left leg more, also complains of chronic insomnia,  UPDATE Dec 19th 2017: She complains of left hip and left knee pain, continue mild gait difficulty, tolerating Tysarbri infusion well, no worsening gait abnormality  We have reviewed MRI of the brain without contrast in November 2017:Abnormal MRI scan of the brain with and without contrast showing multiple periventricular, periatrial, subcortical and brainstem white matter hyperintensities consistent with chronic demyelinating disease. No enhancing lesions are noted. Overall no significant change compared with previous MRI scan dated 03/01/2015  UPDATE Dec 6th 2018: She is overall doing very well, continue complaints of fatigue, gait abnormality, left arm and leg deep achy pain, she rarely gets migraine headaches now, take nortriptyline 20 mg at bedtime, tolerating Tysabri IV infusion every month,  I have personally reviewed MRI of the brain in 2018 comparison to 2017, there is no significant change, no contrast enhancement.  UPDATE September 29 2017: She complains of worsening gait abnormality, reported 50% worse compared to 6 months ago, also has worsening urinary urgency, sometimes bladder incontinence, no significant weakness in her arms, also complains of significant left knee pain, was seen by orthopedic surgeon, intra-articular joint injection provided no significant improvement  UPDATE Jan 29 2018: Patient came in earlier than expected for gait abnormality, failed to respond well to Ampyra,  I reviewed the laboratory evaluation in June 2019, normal negative TSH, CMP, anti-Tysabri antibody, negative JC virus titer, CBC showed no significant abnormality.  She hoped to be evaluated for electronic motorized wheelchair, she ambulate  without assistant most of the time, but does complains of lack of stamina, tendency to fall,  UPDATE May 25 2018: She complains of worsening gait abnormality,  dragging her left leg more across the floor  Personally reviewed MRI cervical spine 01/02/2018, demyelinating plaque C2, 3, C5-6 and 7 level, no change compared to MRI in 2016  MRI of the brain with without contrast few small round periventricular subcortical, pontine, and cerebellar demyelinating plaque, no acute findings no change compared to MRI in 2018  Laboratory evaluation showed negative anti-Tysarbri antibody, JC virus antibody pending in February 2020  REVIEW OF SYSTEMS: Out of a complete 14 system review of symptoms, the patient complains only of the following symptoms, and all other reviewed systems are negative.  AS ABOVE.  ALLERGIES: Allergies  Allergen Reactions  . Sulfa Antibiotics Nausea And Vomiting    HOME MEDICATIONS: Outpatient Medications Prior to Visit  Medication Sig Dispense Refill  . atorvastatin (LIPITOR) 20 MG tablet TAKE ONE TABLET BY MOUTH DAILY    . dalfampridine 10 MG TB12 Take 1 tablet (10 mg total) by mouth 2 (two) times daily. 60 tablet 11  . gabapentin (NEURONTIN) 300 MG capsule Take 1 capsule (300 mg total) by mouth 3 (three) times daily. 90 capsule 11  . lisinopril (PRINIVIL,ZESTRIL) 2.5 MG tablet   0  . Natalizumab (TYSABRI IV) Inject into the vein. Every 28 days     Facility-Administered Medications Prior to Visit  Medication Dose Route Frequency Provider Last Rate Last Dose  . gadopentetate dimeglumine (MAGNEVIST) injection 20 mL  20 mL Intravenous Once PRN Levert Feinstein, MD        PAST MEDICAL HISTORY: Past Medical History:  Diagnosis Date  . Ankle fracture, right   . History of hysterectomy   . Knee joint dislocation    bilateral  . Migraines   . Multiple sclerosis (HCC)     PAST SURGICAL HISTORY: Past Surgical History:  Procedure Laterality Date  . ABDOMINAL HYSTERECTOMY    . CESAREAN SECTION    . MYOMECTOMY      FAMILY HISTORY: Family History  Problem Relation Age of Onset  . Diabetes Mother   . Osteoarthritis Unknown   .  Heart disease Unknown   . Rheumatic fever Unknown   . Hypertension Unknown     SOCIAL HISTORY: Social History   Socioeconomic History  . Marital status: Married    Spouse name: [name]  . Number of children: 1  . Years of education: college  . Highest education level: Not on file  Occupational History    Employer: [employer]    Comment: Does not work  Social Needs  . Financial resource strain: Not on file  . Food insecurity:    Worry: Not on file    Inability: Not on file  . Transportation needs:    Medical: Not on file    Non-medical: Not on file  Tobacco Use  . Smoking status: Never Smoker  . Smokeless tobacco: Never Used  Substance and Sexual Activity  . Alcohol use: No  . Drug use: No  . Sexual activity: Not on file  Lifestyle  . Physical activity:    Days per week: Not on file    Minutes per session: Not on file  . Stress: Not on file  Relationships  . Social connections:    Talks on phone: Not on file    Gets together: Not on file    Attends religious service: Not on file  Active member of club or organization: Not on file    Attends meetings of clubs or organizations: Not on file    Relationship status: Not on file  . Intimate partner violence:    Fear of current or ex partner: Not on file    Emotionally abused: Not on file    Physically abused: Not on file    Forced sexual activity: Not on file  Other Topics Concern  . Not on file  Social History Narrative   Patient is married. [name]).    Patient is unemployed.   Education- College education   Right handed.   Caffeine- None      PHYSICAL EXAM  Vitals:   05/25/18 1531  BP: 125/83  Pulse: 75  Resp: 16  Weight: 186 lb (84.4 kg)  Height: 5\' 6"  (1.676 m)   Body mass index is 30.02 kg/m.   PHYSICAL EXAMNIATION:  Gen: NAD, conversant, well nourised, obese, well groomed                     Cardiovascular: Regular rate rhythm, no peripheral edema, warm, nontender. Eyes: Conjunctivae  clear without exudates or hemorrhage Neck: Supple, no carotid bruits. Pulmonary: Clear to auscultation bilaterally   NEUROLOGICAL EXAM:  MENTAL STATUS: Speech:    Speech is normal; fluent and spontaneous with normal comprehension.  Cognition:     Orientation to time, place and person     Normal recent and remote memory     Normal Attention span and concentration     Normal Language, naming, repeating,spontaneous speech     Fund of knowledge   CRANIAL NERVES: CN II: Visual fields are full to confrontation.  Pupils are round equal and briskly reactive to light. CN III, IV, VI: extraocular movement are normal. No ptosis. CN V: Facial sensation is intact to pinprick in all 3 divisions bilaterally. Corneal responses are intact.  CN VII: Face is symmetric with normal eye closure and smile. CN VIII: Hearing is normal to rubbing fingers CN IX, X: Palate elevates symmetrically. Phonation is normal. CN XI: Head turning and shoulder shrug are intact CN XII: Tongue is midline with normal movements and no atrophy.  MOTOR: Mild bilateral lower extremity spasticity, mild bilateral hip flexion ankle dorsiflexion weakness, left worse than right  REFLEXES: Reflexes are 3 and symmetric at the biceps, triceps, knees, and ankles. Plantar responses are extensor bilaterally  SENSORY: Intact to light touch, pinprick, vibratory sensation  COORDINATION: Rapid alternating movements and fine finger movements are intact. There is no dysmetria on finger-to-nose and heel-knee-shin.    GAIT/STANCE: Need pushing up to get up from seated position, dragging left leg, stiff, mildly unsteady.  DIAGNOSTIC DATA (LABS, IMAGING, TESTING) - I reviewed patient records, labs, notes, testing and imaging myself where available.  Lab Results  Component Value Date   WBC 6.7 09/29/2017   HGB 12.5 09/29/2017   HCT 40.7 09/29/2017   MCV 85 09/29/2017   PLT 209 09/29/2017      Component Value Date/Time   NA 140  09/29/2017 1301   K 4.6 09/29/2017 1301   CL 105 09/29/2017 1301   CO2 24 09/29/2017 1301   GLUCOSE 98 09/29/2017 1301   BUN 13 09/29/2017 1301   CREATININE 0.68 09/29/2017 1301   CALCIUM 9.3 09/29/2017 1301   PROT 7.2 09/29/2017 1301   ALBUMIN 4.3 09/29/2017 1301   AST 16 09/29/2017 1301   ALT 11 09/29/2017 1301   ALKPHOS 60 09/29/2017 1301   BILITOT  0.2 09/29/2017 1301   GFRNONAA 103 09/29/2017 1301   GFRAA 119 09/29/2017 1301    Lab Results  Component Value Date   VITAMINB12 314 08/23/2013   Lab Results  Component Value Date   TSH 1.130 09/29/2017  Treatment:    Tysarbri infusion since Jan 2017.   JC virus antibody: negative 0.17 in Dec 2018.  ASSESSMENT AND PLAN 48 y.o. year old female   Relapsing remitting multiple sclerosis    Symptom onset since 2010  Tysarbri infusion since Jan 2017.  JC virus antibody: negative 0.17 in June 2019 repeat 1 in February 2020 is pending   MRI brain and cervical spine in November 2020 showed no significant abnormality  Patient complains of worsening gait abnormality, I have offered alternative of ocrelizumab treatment,  Worsening gait abnormality  Previously tried Ampyra 10 mg twice a day without significant improvement,   Bilateral lower extremity paresthesia  Try lamotrigine 25 mg titrating to 50 mg twice a day    Levert FeinsteinYijun Kensleigh Gates, M.D. Ph.D.  [facility] [address] [place], [place] [address] Phone: [phone] Fax:      [phone]

## 2018-10-07 ENCOUNTER — Ambulatory Visit: Payer: BLUE CROSS/BLUE SHIELD | Admitting: Adult Health

## 2018-11-23 ENCOUNTER — Telehealth: Payer: Self-pay

## 2018-11-23 NOTE — Progress Notes (Signed)
PATIENT: Dana Duke DOB: 10/16/1970  REASON FOR VISIT: follow up HISTORY FROM: patient  HISTORY OF PRESENT ILLNESS: Today 11/24/18  HISTORY  HISTORY (initial visit was in Nov 2014) Ms. Dana Duke is a 48 years old right-handed African American female, referred by Dr. Suann Larryevesahwar, Janalyn RouseShaili, she has relapsing remitting multiple sclerosis  Since 2011 she noticed gradual onset slow worsening gait difficulty, she tends to drag her foot across the floor, stiff, has difficulty going upstairs, because of knee pain, she suffered a history of right ankle fracture in 2010, seems to dragging her right foot across the floor more, also complains urinary urgency, no bowel incontinence  MRI at [facility] in May 7th 2015, without contrast  MRI cervical spine (without) demonstrating at least 4 spinal cord T2 hyperintensities at cervico-medullary junction, C2, C3-4 and C6.  MRI brain (without) demonstrating multiple (> 20) round and ovoid, periventricular, callosal and peri-callosal, subcortical, juxtacortical, pontine and cerebellar T2 hyperintensities. Some of these are perpendicular to the lateral ventricles on axial and sagittal views. Some of these are hypointense on T1 views. Findings suspicious for demyelinating disease  CSF showed 5 oligoclonal banding, elevated IgG index  Extensive laboratory evaluations showed normal or negative CBC, CMP, Lyme titer, ANA, NMO, TSH, B12, folic acid, RPR. Positive varicellar zoster antibody  The visual evoked response test above was within normal limits on the left, with prolongation of the P100 latency on the right.   JC virus antibody was 0.15 negative in 10/18/2013  MRI of brain, and cervical spine with contrast, showed no contrast enhancement, there was no lesions noticed in MRI thoracic spine,  She startedPlegridy since July 2015, began full dose injection since Sept 15 2015. Complains of injection site reactio, lasting 2-4 weeks, she only has  mild flu like illness   She has fallen few times, more difficulty walking, when she fell few weeks ago, Sep 15th 2015, she has difficulty getting up, could not move from waist down for 2-3 hours, also noticed slurring of her speech. She has mild fever then  She called Dr. Anne HahnWillis, had a repeat MRI of the brain Oct 2015, we have reviewed MRI together, continued evidence of periventricular white matter disease consistent with her diagnosis of multiple sclerosis, there was no change compared to previous image in may 2015  She was started on prednisone tapering since October 16,2015, tolerating the medication well, seems to have mild improvement in her gait difficulty  She also complains of frequent headaches, consistent with migraines,   UPDATE Feb 2nd 2016:  Last visit Jan 19th 2016, this is [name] 9-21 visit, she continued to have intermittent headaches, Maxalt 5 mg works most of the time, but sometimes with severe prolonged headaches, repeat dose of Maxalt fail to improve her symptoms, sleep always helps  UPDATE March 3rd 2016: She complains of worsening headaches almost daily, moderate, tried maxalt.without significant improvement, she is taking baclofen ER 30mg , nortriptyline 20mg  qhs. Also with bilateral knee and hip pain, left worse than right, bilateral feet numbness, coldness sensation, which are chronic.  UPDATE May 25th 2016: She is overall doing very well, no significant bilateral lower extremity spasticity over the past few weeks, baseline urinary incontinence, gradually getting worse, accident almost daily basis, she was recently evaluated by her gynecologist, planning on to see a urologist soon   UPDATE Mar 28 2015: She is with her husband daughter at today's clinical visit, we have reviewed previous MS history, she was started on Plegridy in July 2015, had  a flareup in Oct 2015, was treated with steroid, over the past 18 months, she had slow worsening gait  difficulty  We have reviewed MRI of the brain with and without contrast in November 2016: abnormal MRI of the brain with and without contrast showing T2/FLAIR hyperintense foci at the cervicomedullary junction and within the pons, midbrain and cerebellum in the posterior fossa. Additionally, there are many T2/FLAIR hyperintense foci in the cerebral hemispheres, predominantly in the periventricular white matter. The foci are consistent with chronic demyelinating plaques from multiple sclerosis. None of the foci appears to be acute and there are no enhancing lesions.   abnormal MRI of the cervical spine showing T2 hyperintense foci within the spinal cord at the cervicomedullary junction, C2, C3, C5C6 and C6 as detailed above. Additional foci are noted within the pons. None of the foci appears to be acute. No significant degenerative changes are noted. The lesions are consistent with chronic demyelinating plaques as would be seen in multiple sclerosis. The findings are similar to the MRI report from 08/19/2013.  She still has significant better urgency, no bowel incontinence, taking baclofen ER for bilateral lower extremity spasticity,  Migraine headaches: Overall under good control she is taking nortriptyline 20 mg every night, taking Topamax as needed instead of regular 100 mg twice a day Imitrex as needed was helpful  UPDATE June 29 2015: She started [place]ysarbri infusion since January 2017, she tolerated well, she has much less frequent headaches, Maxalt continued to help, will taper off Topamax, leave her on nortriptyline 20 mg every night, her bilateral lower extremity spasticity has much improved as well, on lower dose of baclofen ER 30 mg daily,  UPDATE August 16th 2017: She complains of increased bilateral lower extremity now taking baclofen 10 mg 3 times a day, also nortriptyline 20 mg every night as migraine prevention, she barely has any headaches now, tolerating Tysarbri  infusion very well, continue have baseline gait abnormality, dragging left leg more, also complains of chronic insomnia,  UPDATE Dec 19th 2017: She complains of left hip and left knee pain, continue mild gait difficulty, tolerating Tysarbri infusion well, no worsening gait abnormality  We have reviewed MRI of the brain without contrast in November 2017:Abnormal MRI scan of the brain with and without contrast showing multiple periventricular, periatrial, subcortical and brainstem white matter hyperintensities consistent with chronic demyelinating disease. No enhancing lesions are noted. Overall no significant change compared with previous MRI scan dated 03/01/2015  UPDATE Dec 6th 2018: She is overall doing very well, continue complaints of fatigue, gait abnormality, left arm and leg deep achy pain, she rarely gets migraine headaches now, take nortriptyline 20 mg at bedtime, tolerating Tysabri IV infusion every month,  I have personally reviewed MRI of the brain in 2018 comparison to 2017, there is no significant change, no contrast enhancement.  UPDATE September 29 2017: She complains of worsening gait abnormality, reported 50% worse compared to 6 months ago, also has worsening urinary urgency, sometimes bladder incontinence, no significant weakness in her arms, also complains of significant left knee pain, was seen by orthopedic surgeon, intra-articular joint injection provided no significant improvement  UPDATE Jan 29 2018: Patient came in earlier than expected for gait abnormality, failed to respond well to Ampyra,  I reviewed the laboratory evaluation in June 2019, normal negative TSH, CMP, anti-Tysabri antibody, negative JC virus titer, CBC showed no significant abnormality.  She hoped to be evaluated for electronic motorized wheelchair, she ambulate without assistant most of the time, but  does complains of lack of stamina, tendency to fall,  UPDATE May 25 2018: She complains of  worsening gait abnormality, dragging her left leg more across the floor  Personally reviewed MRI cervical spine 01/02/2018, demyelinating plaque C2, 3, C5-6 and 7 level, no change compared to MRI in 2016  MRI of the brain with without contrast few small round periventricular subcortical, pontine, and cerebellar demyelinating plaque, no acute findings no change compared to MRI in 2018  Laboratory evaluation showed negative anti-Tysarbri antibody, JC virus antibody pending in February 2020  Update November 24, 2018 SS: on Tysabri since January 2017, MRI of the brain and cervical spine November 2019 showed no significant abnormality, has complained of worsening gait abnormality, was offered Ocrevus at last visit.  Previously she has tried Ampyra for gait abnormality without significant improvement.  She was started on Lamictal 50 mg twice daily for lower extremity paresthesia.  She has chronic pain to her left knee, she has seen orthopedic surgeon in the past.  She has tried gabapentin, Lyrica, Cymbalta.  Over time, she complains of worsening gait, tripping over her left foot.  Several years ago she had physical therapy.  She continues to complain of urinary urgency.  She is to see her eye doctor next week. 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  She is interested in trying Ocrevus due to continued progression of gait abnormality.  She wants further evaluation of her chronic left knee pain.  REVIEW OF SYSTEMS: Out of a complete 14 system review of symptoms, the patient complains only of the following symptoms, and all other reviewed systems are negative.  Gait abnormality, falls, left knee pain  ALLERGIES: Allergies  Allergen Reactions   Sulfa Antibiotics Nausea And Vomiting    HOME MEDICATIONS: Outpatient Medications Prior to Visit  Medication Sig Dispense Refill   atorvastatin (LIPITOR) 20 MG tablet TAKE ONE TABLET BY MOUTH DAILY     Cholecalciferol 25 MCG (1000  UT) tablet Take 1,000 Units by mouth daily.     dalfampridine 10 MG TB12 Take 10 mg by mouth 2 (two) times daily.     gabapentin (NEURONTIN) 300 MG capsule Take 300 mg by mouth 3 (three) times daily.     lisinopril (PRINIVIL,ZESTRIL) 2.5 MG tablet   0   MYRBETRIQ 25 MG TB24 tablet Take 25 mg by mouth daily.     Natalizumab (TYSABRI IV) Inject into the vein. Every 28 days     UNABLE TO FIND Take 1 tablet by mouth 3 (three) times daily. Med Name: Glysen     UNABLE TO FIND Take 1 tablet by mouth 3 (three) times daily between meals. Med Name: Curalin     lamoTRIgine (LAMICTAL) 25 MG tablet Take 2 tablets (50 mg total) by mouth daily. (Patient not taking: Reported on 11/24/2018) 120 tablet 6   Facility-Administered Medications Prior to Visit  Medication Dose Route Frequency Provider Last Rate Last Dose   gadopentetate dimeglumine (MAGNEVIST) injection 20 mL  20 mL Intravenous Once PRN Levert FeinsteinYan, Yijun, MD        PAST MEDICAL HISTORY: Past Medical History:  Diagnosis Date   Ankle fracture, right    History of hysterectomy    Knee joint dislocation    bilateral   Migraines    Multiple sclerosis (HCC)     PAST SURGICAL HISTORY: Past Surgical History:  Procedure Laterality Date   ABDOMINAL HYSTERECTOMY     CESAREAN SECTION     MYOMECTOMY  FAMILY HISTORY: Family History  Problem Relation Age of Onset   Diabetes Mother    Osteoarthritis Unknown    Heart disease Unknown    Rheumatic fever Unknown    Hypertension Unknown     SOCIAL HISTORY: Social History   Socioeconomic History   Marital status: Married    Spouse name: Merchant navy officer   Number of children: 1   Years of education: college   Highest education level: Not on file  Occupational History    Employer: [employer]    Comment: Does not work  Ecologist strain: Not on file   Food insecurity    Worry: Not on file    Inability: Not on Occupational hygienist needs     Medical: Not on file    Non-medical: Not on file  Tobacco Use   Smoking status: Never Smoker   Smokeless tobacco: Never Used  Substance and Sexual Activity   Alcohol use: No   Drug use: No   Sexual activity: Not on file  Lifestyle   Physical activity    Days per week: Not on file    Minutes per session: Not on file   Stress: Not on file  Relationships   Social connections    Talks on phone: Not on file    Gets together: Not on file    Attends religious service: Not on file    Active member of club or organization: Not on file    Attends meetings of clubs or organizations: Not on file    Relationship status: Not on file   Intimate partner violence    Fear of current or ex partner: Not on file    Emotionally abused: Not on file    Physically abused: Not on file    Forced sexual activity: Not on file  Other Topics Concern   Not on file  Social History Narrative   Patient is married. [name]).    Patient is unemployed.   Education- College education   Right handed.   Caffeine- None    PHYSICAL EXAM  Vitals:   11/24/18 1545  BP: 116/80  Pulse: 72  Temp: 97.8 F (36.6 C)  TempSrc: Oral  Weight: 177 lb 3.2 oz (80.4 kg)  Height: 5\' 6"  (1.676 m)   Body mass index is 28.6 kg/m.  Generalized: Well developed, in no acute distress   Neurological examination  Mentation: Alert oriented to time, place, history taking. Follows all commands speech and language fluent Cranial nerve II-XII: Pupils were equal round reactive to light. Extraocular movements were full, visual field were full on confrontational test. Facial sensation and strength were normal.  Head turning and shoulder shrug  were normal and symmetric. Motor: The motor testing reveals 4 over 5 strength of all 4 extremities. Good symmetric motor tone is noted throughout.  Sensory: Sensory testing is intact to soft touch on all 4 extremities. No evidence of extinction is noted.  Coordination: Cerebellar  testing reveals good finger-nose-finger and heel-to-shin bilaterally.  Gait and station: Gait is mildly unsteady, slight forward lean, dragging left leg, has to push off from a seated position Reflexes: Deep tendon reflexes are symmetric and normal bilaterally.   DIAGNOSTIC DATA (LABS, IMAGING, TESTING) - I reviewed patient records, labs, notes, testing and imaging myself where available.  Lab Results  Component Value Date   WBC 6.7 09/29/2017   HGB 12.5 09/29/2017   HCT 40.7 09/29/2017   MCV 85 09/29/2017   PLT  209 09/29/2017      Component Value Date/Time   NA 140 09/29/2017 1301   K 4.6 09/29/2017 1301   CL 105 09/29/2017 1301   CO2 24 09/29/2017 1301   GLUCOSE 98 09/29/2017 1301   BUN 13 09/29/2017 1301   CREATININE 0.68 09/29/2017 1301   CALCIUM 9.3 09/29/2017 1301   PROT 7.2 09/29/2017 1301   ALBUMIN 4.3 09/29/2017 1301   AST 16 09/29/2017 1301   ALT 11 09/29/2017 1301   ALKPHOS 60 09/29/2017 1301   BILITOT 0.2 09/29/2017 1301   GFRNONAA 103 09/29/2017 1301   GFRAA 119 09/29/2017 1301   No results found for: CHOL, HDL, LDLCALC, LDLDIRECT, TRIG, CHOLHDL No results found for: HGBA1C Lab Results  Component Value Date   VITAMINB12 314 08/23/2013   Lab Results  Component Value Date   TSH 1.130 09/29/2017      ASSESSMENT AND PLAN 48 y.o. year old female  has a past medical history of Ankle fracture, right, History of hysterectomy, Knee joint dislocation, Migraines, and Multiple sclerosis ([place]). here with:  1.  Relapsing remitting multiple sclerosis -Symptom onset since 2010 -Tysabri infusions since January 2017 -JC virus antibody February 2020, 0.21, negative inhibition assay -MRI of the brain and cervical spine in November 2019 showed no change compared to previous study, continue to show evidence of MS lesions at cervical spine -Due to report of continued gait abnormality, she has decided to pursue Ocrevus -I will check basic lab work today, including  prescreening lab work for [employer] start (CBC, CMP, QuantiFERON, hepatitis B surface antigen, hepatitis B antibody, hepatitis C antibody, varicella, JCV antibody) -We will start paperwork to get approval for Ocrevus -We will repeat MRI of the brain and cervical spine at next visit  2.  Worsening gait abnormality -Previously tried Ampyra without significant improvement -We will switch to [place] -May consider referral to physical therapy in the future  3.  Bilateral lower extremity paresthesia -She continues to complain of left knee achy pain -She has tried Lamictal, gabapentin, Lyrica, Cymbalta, nortriptyline, baclofen, Ultram  -Today we will try Keppra 250 mg twice daily -She wants to know why she has chronic left knee pain (has seen orthopedic surgeon in the past, given joint injection with no significant improvement), has reported since 2014, unable to identify etiology at this point  I spent 25 minutes with the patient. 50% of this time was spent discussing her plan of care.   Butler Denmark, AGNP-C, DNP 11/24/2018, 8:23 PM [facility] [address] [place], [place] [address] [phone]

## 2018-11-23 NOTE — Telephone Encounter (Signed)
Patient called wanting to know that since she has an appt with Butler Denmark, NP on 11-24-2018. She was wondering could her JCV labs be drawn tomorrow.

## 2018-11-23 NOTE — Telephone Encounter (Signed)
Last JCV drawn on 05/25/2018.  She is due to a re-check.  She has an appt with Sarah on 11/24/2018.

## 2018-11-24 ENCOUNTER — Ambulatory Visit (INDEPENDENT_AMBULATORY_CARE_PROVIDER_SITE_OTHER): Payer: BC Managed Care – PPO | Admitting: Neurology

## 2018-11-24 ENCOUNTER — Other Ambulatory Visit: Payer: Self-pay

## 2018-11-24 ENCOUNTER — Encounter: Payer: Self-pay | Admitting: Neurology

## 2018-11-24 VITALS — BP 116/80 | HR 72 | Temp 97.8°F | Ht 66.0 in | Wt 177.2 lb

## 2018-11-24 DIAGNOSIS — R269 Unspecified abnormalities of gait and mobility: Secondary | ICD-10-CM

## 2018-11-24 DIAGNOSIS — G35 Multiple sclerosis: Secondary | ICD-10-CM

## 2018-11-24 MED ORDER — LEVETIRACETAM 250 MG PO TABS: 250.0000 mg | ORAL_TABLET | Freq: Two times a day (BID) | ORAL | 5 refills | Status: DC

## 2018-11-24 NOTE — Patient Instructions (Signed)
Please try Keppra 250 mg twice daily to see if that helps your knee pain. We will start blood work for the switch to [employer].

## 2018-11-24 NOTE — Progress Notes (Signed)
JCV to [facility] 11-24-18 sy

## 2018-11-25 NOTE — Progress Notes (Signed)
I have reviewed and agreed above plan. 

## 2018-11-26 LAB — COMPREHENSIVE METABOLIC PANEL
ALT: 13 IU/L (ref 0–32)
AST: 15 IU/L (ref 0–40)
Albumin/Globulin Ratio: 1.5 (ref 1.2–2.2)
Albumin: 4.3 g/dL (ref 3.8–4.8)
Alkaline Phosphatase: 64 IU/L (ref 39–117)
BUN/Creatinine Ratio: 14 (ref 9–23)
BUN: 10 mg/dL (ref 6–24)
Bilirubin Total: 0.3 mg/dL (ref 0.0–1.2)
CO2: 24 mmol/L (ref 20–29)
Calcium: 9.7 mg/dL (ref 8.7–10.2)
Chloride: 104 mmol/L (ref 96–106)
Creatinine, Ser: 0.73 mg/dL (ref 0.57–1.00)
GFR calc Af Amer: 111 mL/min/{1.73_m2} (ref >59)
GFR calc non Af Amer: 96 mL/min/{1.73_m2} (ref >59)
Globulin, Total: 2.8 g/dL (ref 1.5–4.5)
Glucose: 85 mg/dL (ref 65–99)
Potassium: 4.2 mmol/L (ref 3.5–5.2)
Sodium: 141 mmol/L (ref 134–144)
Total Protein: 7.1 g/dL (ref 6.0–8.5)

## 2018-11-26 LAB — CBC WITH DIFFERENTIAL/PLATELET
Basophils Absolute: 0 10*3/uL (ref 0.0–0.2)
Basos: 0 %
EOS (ABSOLUTE): 0.2 10*3/uL (ref 0.0–0.4)
Eos: 2 %
Hematocrit: 39.4 % (ref 34.0–46.6)
Hemoglobin: 12.2 g/dL (ref 11.1–15.9)
Immature Grans (Abs): 0 10*3/uL (ref 0.0–0.1)
Immature Granulocytes: 0 %
Lymphocytes Absolute: 3.2 10*3/uL — ABNORMAL HIGH (ref 0.7–3.1)
Lymphs: 41 %
MCH: 25.6 pg — ABNORMAL LOW (ref 26.6–33.0)
MCHC: 31 g/dL — ABNORMAL LOW (ref 31.5–35.7)
MCV: 83 fL (ref 79–97)
Monocytes Absolute: 0.5 10*3/uL (ref 0.1–0.9)
Monocytes: 7 %
Neutrophils Absolute: 3.7 10*3/uL (ref 1.4–7.0)
Neutrophils: 50 %
Platelets: 273 10*3/uL (ref 150–450)
RBC: 4.77 x10E6/uL (ref 3.77–5.28)
RDW: 15.8 % — ABNORMAL HIGH (ref 11.7–15.4)
WBC: 7.7 10*3/uL (ref 3.4–10.8)

## 2018-11-26 LAB — QUANTIFERON-TB GOLD PLUS
QuantiFERON Mitogen Value: >10 IU/mL
QuantiFERON Nil Value: 0.03 IU/mL
QuantiFERON TB1 Ag Value: 0.02 IU/mL
QuantiFERON TB2 Ag Value: 0.02 IU/mL
QuantiFERON-TB Gold Plus: NEGATIVE

## 2018-11-26 LAB — HEPATITIS C ANTIBODY: Hep C Virus Ab: <0.1 s/co ratio (ref 0.0–0.9)

## 2018-11-26 LAB — VARICELLA ZOSTER ANTIBODY, IGG: Varicella zoster IgG: 408 index (ref >165)

## 2018-11-26 LAB — HEPATITIS B SURFACE ANTIGEN: Hepatitis B Surface Ag: NEGATIVE

## 2018-11-30 ENCOUNTER — Telehealth: Payer: Self-pay | Admitting: *Deleted

## 2018-11-30 ENCOUNTER — Telehealth: Payer: Self-pay | Admitting: Neurology

## 2018-11-30 NOTE — Telephone Encounter (Signed)
Orders for Ocrevus placed at [facility]. (notes, MRI, labs, enrollment form- signed by pt/ provider, demogs, insurance).

## 2018-11-30 NOTE — Telephone Encounter (Signed)
JCV 0.14 negative collected 11/24/2018.

## 2018-12-01 ENCOUNTER — Telehealth: Payer: Self-pay | Admitting: *Deleted

## 2018-12-01 NOTE — Telephone Encounter (Signed)
I called pt and relayed that per SS/NP that lab results unremarkable.  JCV negative index 0.14.  Last tysabri was 11-17-18 and scheduled for next infusion 12-15-18.  Will check with Leann in [facility] about ocrevus authorization.  Pt has questions about "[place]" for balance.  Thought on this??

## 2018-12-01 NOTE — Telephone Encounter (Signed)
Please call the patient. I talked with Dr. Krista Blue about the [place]. We have not used it before. I do not think it would be harmful if she wants to try.

## 2018-12-01 NOTE — Telephone Encounter (Signed)
-----   Message from Suzzanne Cloud, NP sent at 11/30/2018 11:40 AM EDT ----- Please call patient. Labs are unremarkable. We have submitted paper work for [employer]. Her next appointment for Tysabri is September 1. I discussed with Dr. Krista Blue. She will need to wait 4-6 weeks when switching off Tysabri, started Ocrevus. JCV is pending.

## 2018-12-02 NOTE — Telephone Encounter (Addendum)
I called pt and and relayed to her that her tysabri appt with [facility] was cancelled.  The washout period from tysabri to ocrevus is 4-6 wks.  The ocrevus p/w turned in 11-30-18 per Leanne in [facility].  Pt will contact [place] if she decides to try this (per noted below by SS/NP and Dr. Krista Blue). She verbalized understanding.

## 2018-12-30 ENCOUNTER — Other Ambulatory Visit: Payer: Self-pay | Admitting: Family Medicine

## 2018-12-30 DIAGNOSIS — Z1231 Encounter for screening mammogram for malignant neoplasm of breast: Secondary | ICD-10-CM

## 2019-01-06 ENCOUNTER — Telehealth: Payer: Self-pay | Admitting: *Deleted

## 2019-01-06 NOTE — Telephone Encounter (Signed)
Received fax from [place] needing additional diagnosis codes added: R68.89, R26.9, Z79.899. signed and fax confirmation received [phone].

## 2019-01-20 ENCOUNTER — Telehealth: Payer: Self-pay | Admitting: Neurology

## 2019-01-20 DIAGNOSIS — G35 Multiple sclerosis: Secondary | ICD-10-CM

## 2019-01-20 NOTE — Telephone Encounter (Signed)
I called pt.  She had first round of ocrevus 2 wks ago, is to have 2nd round tomorrow at 1100 with [facility].  She is c/o of constant bilateral eye pain (notes slight pain when moves eyes from R to L and vice versa).  Pain is around eyes, and inside eyes.  No itching, drainage, does not think allergies. No vision changes.  She recalls issues yrs ago maybe same that she got prednisone, change in MS medication.  ? Optic neuritis, allergy? Reaction ocrevus?  Please advise.

## 2019-01-20 NOTE — Telephone Encounter (Signed)
I called the patient.  She reports 5 days ago she developed mild pain to both eyes and when moving her eyes.  She reports a mild headache.  She denies any visual disturbance or blurry vision.  She denies any new numbness or weakness in her arms or legs.  She is scheduled to have her Ocrevus infusion tomorrow morning.  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  She was treated with prednisone. I will discuss with Dr. Krista Blue and advise about next steps. She had her 1st round of Ocrevus 2 weeks ago.

## 2019-01-20 NOTE — Telephone Encounter (Signed)
Pt called in and stated she is feeling pain in both of her eyes, she states it been going on for about a week now  CB# [phone]

## 2019-01-21 ENCOUNTER — Telehealth: Payer: Self-pay | Admitting: Neurology

## 2019-01-21 ENCOUNTER — Other Ambulatory Visit: Payer: Self-pay | Admitting: *Deleted

## 2019-01-21 ENCOUNTER — Other Ambulatory Visit: Payer: Self-pay | Admitting: Neurology

## 2019-01-21 DIAGNOSIS — G35 Multiple sclerosis: Secondary | ICD-10-CM

## 2019-01-21 NOTE — Addendum Note (Signed)
Addended by: Brandon Melnick on: 01/21/2019 11:28 AM   Modules accepted: Orders

## 2019-01-21 NOTE — Telephone Encounter (Signed)
Infusion nurse Lovena Le and I talked with patient,  Patient supposed to get ocrelizumab 300 mg today, instead she got 600 mg today, patient has no adverse reaction  I have put in order for laboratory evaluation, CBC with differentiation, CD19/CD20, IgM IgG level.  She will come in 2 weeks for laboratory evaluation  Keep follow-up appointment on March 06, 2019,  She is advised to call our office for any questions.

## 2019-01-21 NOTE — Progress Notes (Signed)
I called the patient. She is coming in today for infusion. Dr. Krista Blue will talk with her and evaluate her concern. The patient was agreeable.

## 2019-01-21 NOTE — Addendum Note (Signed)
Addended by: Inis Sizer D on: 01/21/2019 11:30 AM   Modules accepted: Orders

## 2019-01-21 NOTE — Telephone Encounter (Signed)
I talked with patient, she complains of few days history of bilateral retro-orbital area pressure and pain, especially with eye movement, but there is no, washout, no visual change,  1, less likely optic neuritis because his bilaterally, no visual loss, I have advised her to continue follow-up with her ophthalmologist. 2.  Continue ocrelizumab infusion 3.  MRI of the brain with without contrast

## 2019-01-21 NOTE — Addendum Note (Signed)
Addended by: Marcial Pacas on: 01/21/2019 12:30 PM   Modules accepted: Orders

## 2019-01-22 LAB — HEPATITIS B CORE ANTIBODY, TOTAL: Hep B Core Total Ab: NEGATIVE

## 2019-01-22 LAB — HEPATITIS B SURFACE ANTIBODY,QUALITATIVE: Hep B Surface Ab, Qual: REACTIVE

## 2019-01-25 ENCOUNTER — Telehealth: Payer: Self-pay | Admitting: Neurology

## 2019-01-25 ENCOUNTER — Telehealth: Payer: Self-pay

## 2019-01-25 NOTE — Telephone Encounter (Signed)
-----   Message from Suzzanne Cloud, NP sent at 01/25/2019 12:21 PM EDT ----- Please call the patient. Labs are unremarkable, as expected, no conflict with Ocrevus.

## 2019-01-25 NOTE — Telephone Encounter (Signed)
[employer] Auth: [id number] (exp. 01/25/19 to 07/23/19) order sent to GI. They will reach out to the patient to schedule.

## 2019-01-25 NOTE — Telephone Encounter (Signed)
Spoke with the patient and she verbalized understanding her results. No questions or concerns at this time.   

## 2019-02-03 ENCOUNTER — Other Ambulatory Visit: Payer: Self-pay | Admitting: *Deleted

## 2019-02-03 ENCOUNTER — Other Ambulatory Visit (INDEPENDENT_AMBULATORY_CARE_PROVIDER_SITE_OTHER): Payer: Self-pay

## 2019-02-03 DIAGNOSIS — G35 Multiple sclerosis: Secondary | ICD-10-CM

## 2019-02-03 DIAGNOSIS — Z0289 Encounter for other administrative examinations: Secondary | ICD-10-CM

## 2019-02-03 DIAGNOSIS — R269 Unspecified abnormalities of gait and mobility: Secondary | ICD-10-CM

## 2019-02-03 NOTE — Addendum Note (Signed)
Addended by: Brandon Melnick on: 02/03/2019 04:31 PM   Modules accepted: Orders

## 2019-02-03 NOTE — Addendum Note (Signed)
Addended by: Inis Sizer D on: 02/03/2019 04:29 PM   Modules accepted: Orders

## 2019-02-06 LAB — CBC WITH DIFFERENTIAL/PLATELET: EOS (ABSOLUTE): 0.1 10*3/uL (ref 0.0–0.4)

## 2019-02-11 ENCOUNTER — Other Ambulatory Visit: Payer: Self-pay

## 2019-02-12 ENCOUNTER — Ambulatory Visit
Admission: RE | Admit: 2019-02-12 | Discharge: 2019-02-12 | Disposition: A | Discharge status: Home / Self Care | Payer: BC Managed Care – PPO | Source: Ambulatory Visit | Attending: Neurology | Admitting: Neurology

## 2019-02-12 ENCOUNTER — Other Ambulatory Visit: Payer: Self-pay

## 2019-02-12 DIAGNOSIS — G35 Multiple sclerosis: Secondary | ICD-10-CM

## 2019-02-12 MED ORDER — GADOBENATE DIMEGLUMINE 529 MG/ML IV SOLN
15.0000 mL | Freq: Once | INTRAVENOUS | Status: AC | PRN
  Administered 2019-02-12: 15 mL via INTRAVENOUS

## 2019-02-25 ENCOUNTER — Other Ambulatory Visit: Payer: Self-pay

## 2019-02-25 ENCOUNTER — Encounter: Payer: Self-pay | Admitting: Neurology

## 2019-02-25 ENCOUNTER — Ambulatory Visit (INDEPENDENT_AMBULATORY_CARE_PROVIDER_SITE_OTHER): Payer: BC Managed Care – PPO | Admitting: Neurology

## 2019-02-25 VITALS — BP 99/66 | HR 74 | Temp 97.3°F | Ht 66.0 in | Wt 165.0 lb

## 2019-02-25 DIAGNOSIS — G35 Multiple sclerosis: Secondary | ICD-10-CM

## 2019-02-25 DIAGNOSIS — R252 Cramp and spasm: Secondary | ICD-10-CM

## 2019-02-25 DIAGNOSIS — R269 Unspecified abnormalities of gait and mobility: Secondary | ICD-10-CM | POA: Diagnosis not present

## 2019-02-25 DIAGNOSIS — F5104 Psychophysiologic insomnia: Secondary | ICD-10-CM

## 2019-02-25 MED ORDER — DULOXETINE HCL 60 MG PO CPEP: 60.0000 mg | ORAL_CAPSULE | Freq: Every day | ORAL | 12 refills | Status: DC

## 2019-02-25 MED ORDER — CLONAZEPAM 0.5 MG PO TABS: 0.5000 mg | ORAL_TABLET | Freq: Every evening | ORAL | 5 refills | Status: DC | PRN

## 2019-02-25 NOTE — Progress Notes (Signed)
PATIENT: Dana Duke DOB: 09-24-1970  REASON FOR VISIT: follow up HISTORY FROM: patient  HISTORY OF PRESENT ILLNESS: (initial visit was in Nov 2014) Dana Duke is a 48 years old right-handed African American female, referred by Dana Duke, Dana Duke, she has relapsing remitting multiple sclerosis  Since 2011 she noticed gradual onset slow worsening gait difficulty, she tends to drag her foot across the floor, stiff, has difficulty going upstairs, because of knee pain, she suffered a history of right ankle fracture in 2010, seems to dragging her right foot across the floor more, also complains urinary urgency, no bowel incontinence  MRI at [facility] in May 7th 2015, without contrast  MRI cervical spine (without) demonstrating at least 4 spinal cord T2 hyperintensities at cervico-medullary junction, C2, C3-4 and C6.  MRI brain (without) demonstrating multiple (> 20) round and ovoid, periventricular, callosal and peri-callosal, subcortical, juxtacortical, pontine and cerebellar T2 hyperintensities. Some of these are perpendicular to the lateral ventricles on axial and sagittal views. Some of these are hypointense on T1 views. Findings suspicious for demyelinating disease  CSF showed 5 oligoclonal banding, elevated IgG index  Extensive laboratory evaluations showed normal or negative CBC, CMP, Lyme titer, ANA, NMO, TSH, B12, folic acid, RPR. Positive varicellar zoster antibody  The visual evoked response test above was within normal limits on the left, with prolongation of the P100 latency on the right.   JC virus antibody was 0.15 negative in 10/18/2013  MRI of brain, and cervical spine with contrast, showed no contrast enhancement, there was no lesions noticed in MRI thoracic spine,  She startedPlegridy since July 2015, began full dose injection since Sept 15 2015. Complains of injection site reactio, lasting 2-4 weeks, she only has mild flu like illness   She has  fallen few times, more difficulty walking, when she fell few weeks ago, Sep 15th 2015, she has difficulty getting up, could not move from waist down for 2-3 hours, also noticed slurring of her speech. She has mild fever then  She called Dana Duke, had a repeat MRI of the brain Oct 2015, we have reviewed MRI together, continued evidence of periventricular white matter disease consistent with her diagnosis of multiple sclerosis, there was no change compared to previous image in may 2015  She was started on prednisone tapering since October 16,2015, tolerating the medication well, seems to have mild improvement in her gait difficulty  She also complains of frequent headaches, consistent with migraines,   UPDATE Feb 2nd 2016:  Last visit Jan 19th 2016, this is [name] 9-21 visit, she continued to have intermittent headaches, Maxalt 5 mg works most of the time, but sometimes with severe prolonged headaches, repeat dose of Maxalt fail to improve her symptoms, sleep always helps  UPDATE March 3rd 2016: She complains of worsening headaches almost daily, moderate, tried maxalt.without significant improvement, she is taking baclofen ER 30mg , nortriptyline 20mg  qhs. Also with bilateral knee and hip pain, left worse than right, bilateral feet numbness, coldness sensation, which are chronic.  UPDATE May 25th 2016: She is overall doing very well, no significant bilateral lower extremity spasticity over the past few weeks, baseline urinary incontinence, gradually getting worse, accident almost daily basis, she was recently evaluated by her gynecologist, planning on to see a urologist soon   UPDATE Mar 28 2015: She is with her husband daughter at today's clinical visit, we have reviewed previous MS history, she was started on Plegridy in July 2015, had a flareup in Oct 2015, was  treated with steroid, over the past 18 months, she had slow worsening gait difficulty  We have reviewed MRI of the  brain with and without contrast in November 2016: abnormal MRI of the brain with and without contrast showing T2/FLAIR hyperintense foci at the cervicomedullary junction and within the pons, midbrain and cerebellum in the posterior fossa. Additionally, there are many T2/FLAIR hyperintense foci in the cerebral hemispheres, predominantly in the periventricular white matter. The foci are consistent with chronic demyelinating plaques from multiple sclerosis. None of the foci appears to be acute and there are no enhancing lesions.   abnormal MRI of the cervical spine showing T2 hyperintense foci within the spinal cord at the cervicomedullary junction, C2, C3, C5C6 and C6 as detailed above. Additional foci are noted within the pons. None of the foci appears to be acute. No significant degenerative changes are noted. The lesions are consistent with chronic demyelinating plaques as would be seen in multiple sclerosis. The findings are similar to the MRI report from 08/19/2013.  She still has significant better urgency, no bowel incontinence, taking baclofen ER for bilateral lower extremity spasticity,  Migraine headaches: Overall under good control she is taking nortriptyline 20 mg every night, taking Topamax as needed instead of regular 100 mg twice a day Imitrex as needed was helpful  UPDATE June 29 2015: She started [place] infusion since January 2017, she tolerated well, she has much less frequent headaches, Maxalt continued to help, will taper off Topamax, leave her on nortriptyline 20 mg every night, her bilateral lower extremity spasticity has much improved as well, on lower dose of baclofen ER 30 mg daily,  UPDATE August 16th 2017: She complains of increased bilateral lower extremity now taking baclofen 10 mg 3 times a day, also nortriptyline 20 mg every night as migraine prevention, she barely has any headaches now, tolerating Tysarbri infusion very well, continue have baseline gait  abnormality, dragging left leg more, also complains of chronic insomnia,  UPDATE Dec 19th 2017: She complains of left hip and left knee pain, continue mild gait difficulty, tolerating Tysarbri infusion well, no worsening gait abnormality  We have reviewed MRI of the brain without contrast in November 2017:Abnormal MRI scan of the brain with and without contrast showing multiple periventricular, periatrial, subcortical and brainstem white matter hyperintensities consistent with chronic demyelinating disease. No enhancing lesions are noted. Overall no significant change compared with previous MRI scan dated 03/01/2015  UPDATE Dec 6th 2018: She is overall doing very well, continue complaints of fatigue, gait abnormality, left arm and leg deep achy pain, she rarely gets migraine headaches now, take nortriptyline 20 mg at bedtime, tolerating Tysabri IV infusion every month,  I have personally reviewed MRI of the brain in 2018 comparison to 2017, there is no significant change, no contrast enhancement.  UPDATE September 29 2017: She complains of worsening gait abnormality, reported 50% worse compared to 6 months ago, also has worsening urinary urgency, sometimes bladder incontinence, no significant weakness in her arms, also complains of significant left knee pain, was seen by orthopedic surgeon, intra-articular joint injection provided no significant improvement  UPDATE Jan 29 2018: Patient came in earlier than expected for gait abnormality, failed to respond well to Ampyra,  I reviewed the laboratory evaluation in June 2019, normal negative TSH, CMP, anti-Tysabri antibody, negative JC virus titer, CBC showed no significant abnormality.  She hoped to be evaluated for electronic motorized wheelchair, she ambulate without assistant most of the time, but does complains of lack of stamina,  tendency to fall,  UPDATE May 25 2018: She complains of worsening gait abnormality, dragging her left leg more  across the floor  Personally reviewed MRI cervical spine 01/02/2018, demyelinating plaque C2, 3, C5-6 and 7 level, no change compared to MRI in 2016  MRI of the brain with without contrast few small round periventricular subcortical, pontine, and cerebellar demyelinating plaque, no acute findings no change compared to MRI in 2018  Laboratory evaluation showed negative anti-Tysarbri antibody, JC virus antibody pending in February 2020  UPDATE Feb 25 2019: She was able to tolerate ocrelizumab infusion, no significant side effect noted, she continue complains slow worsening gait abnormality. Laboratory evaluation February 03, 2019 showed normal CBC, normal IgG, IgA, IgM, definite B-cell (CD19 plus, CD20 plus) is not identified  I personally reviewed MRI of the brain without contrast February 12, 2019, multiple T2/flair hyperintensity foci in the brainstem, spinal cord, cerebellum, cerebral hemisphere, in a pattern, and configuration, consistent with chronic demyelinating plaque associated with multiple sclerosis.  There was no change compared to previous MRI in November 2019.  No contrast-enhancement  REVIEW OF SYSTEMS: Out of a complete 14 system review of symptoms, the patient complains only of the following symptoms, and all other reviewed systems are negative.  As above  ALLERGIES: Allergies  Allergen Reactions  . Sulfa Antibiotics Nausea And Vomiting    HOME MEDICATIONS: Outpatient Medications Prior to Visit  Medication Sig Dispense Refill  . atorvastatin (LIPITOR) 20 MG tablet TAKE ONE TABLET BY MOUTH DAILY    . Cholecalciferol 25 MCG (1000 UT) tablet Take 1,000 Units by mouth daily.    Marland Kitchen levETIRAcetam (KEPPRA) 250 MG tablet Take 1 tablet (250 mg total) by mouth 2 (two) times daily. 60 tablet 5  . lisinopril (PRINIVIL,ZESTRIL) 2.5 MG tablet   0  . loteprednol (LOTEMAX) 0.5 % ophthalmic suspension     . MYRBETRIQ 25 MG TB24 tablet Take 25 mg by mouth daily.    Marland Kitchen ocrelizumab  (OCREVUS) 300 MG/10ML injection Inject into the vein.    Marland Kitchen UNABLE TO FIND Take 1 tablet by mouth 3 (three) times daily. Med Name: Johnney Ou    . UNABLE TO FIND Take 1 tablet by mouth 3 (three) times daily between meals. Med Name: Curalin    . dalfampridine 10 MG TB12 Take 10 mg by mouth 2 (two) times daily.    Marland Kitchen gabapentin (NEURONTIN) 300 MG capsule Take 300 mg by mouth 3 (three) times daily.    . Natalizumab (TYSABRI IV) Inject into the vein. Every 28 days     Facility-Administered Medications Prior to Visit  Medication Dose Route Frequency Provider Last Rate Last Dose  . gadopentetate dimeglumine (MAGNEVIST) injection 20 mL  20 mL Intravenous Once PRN Levert Feinstein, MD        PAST MEDICAL HISTORY: Past Medical History:  Diagnosis Date  . Ankle fracture, right   . History of hysterectomy   . Knee joint dislocation    bilateral  . Migraines   . Multiple sclerosis (HCC)     PAST SURGICAL HISTORY: Past Surgical History:  Procedure Laterality Date  . ABDOMINAL HYSTERECTOMY    . CESAREAN SECTION    . MYOMECTOMY      FAMILY HISTORY: Family History  Problem Relation Age of Onset  . Diabetes Mother   . Osteoarthritis Other   . Heart disease Other   . Rheumatic fever Other   . Hypertension Other     SOCIAL HISTORY: Social History   Socioeconomic History  .  Marital status: Married    Spouse name: Elta Guadeloupe  . Number of children: 1  . Years of education: college  . Highest education level: Not on file  Occupational History    Employer: [employer]    Comment: Does not work  Social Needs  . Financial resource strain: Not on file  . Food insecurity    Worry: Not on file    Inability: Not on file  . Transportation needs    Medical: Not on file    Non-medical: Not on file  Tobacco Use  . Smoking status: Never Smoker  . Smokeless tobacco: Never Used  Substance and Sexual Activity  . Alcohol use: No  . Drug use: No  . Sexual activity: Not on file  Lifestyle  . Physical  activity    Days per week: Not on file    Minutes per session: Not on file  . Stress: Not on file  Relationships  . Social Herbalist on phone: Not on file    Gets together: Not on file    Attends religious service: Not on file    Active member of club or organization: Not on file    Attends meetings of clubs or organizations: Not on file    Relationship status: Not on file  . Intimate partner violence    Fear of current or ex partner: Not on file    Emotionally abused: Not on file    Physically abused: Not on file    Forced sexual activity: Not on file  Other Topics Concern  . Not on file  Social History Narrative   Patient is married. Elta Guadeloupe).    Patient is unemployed.   Education- College education   Right handed.   Caffeine- None    PHYSICAL EXAM  Vitals:   02/25/19 1426  BP: 99/66  Pulse: 74  Temp: (!) 97.3 F (36.3 C)  Weight: 165 lb (74.8 kg)  Height: 5\' 6"  (1.676 m)   Body mass index is 26.63 kg/m.  Generalized: Well developed, in no acute distress   Neurological examination  Mentation: Alert oriented to time, place, history taking. Follows all commands speech and language fluent Cranial nerve II-XII: Pupils were equal round reactive to light. Extraocular movements were full, visual field were full on confrontational test. Facial sensation and strength were normal.  Head turning and shoulder shrug  were normal and symmetric. Motor: Mild fixation of left upper extremity on rapid rotating movement, mild spasticity in the hip flexion weakness at bilateral lower extremity. Sensory: Length dependent sensory changes Coordination: Cerebellar testing reveals good finger-nose-finger and heel-to-shin bilaterally.  Gait and station: She needs push-up to get up from seated position, wide-based, unsteady Reflexes: Deep tendon reflexes are symmetric and normal bilaterally.   DIAGNOSTIC DATA (LABS, IMAGING, TESTING) - I reviewed patient records, labs, notes,  testing and imaging myself where available.  Lab Results  Component Value Date   WBC 6.8 02/03/2019   HGB 11.7 02/03/2019   HCT 37.2 02/03/2019   MCV 82 02/03/2019   PLT 244 02/03/2019      Component Value Date/Time   NA 141 11/24/2018 1640   K 4.2 11/24/2018 1640   CL 104 11/24/2018 1640   CO2 24 11/24/2018 1640   GLUCOSE 85 11/24/2018 1640   BUN 10 11/24/2018 1640   CREATININE 0.73 11/24/2018 1640   CALCIUM 9.7 11/24/2018 1640   PROT 7.1 11/24/2018 1640   ALBUMIN 4.3 11/24/2018 1640   AST 15  11/24/2018 1640   ALT 13 11/24/2018 1640   ALKPHOS 64 11/24/2018 1640   BILITOT 0.3 11/24/2018 1640   GFRNONAA 96 11/24/2018 1640   GFRAA 111 11/24/2018 1640   No results found for: CHOL, HDL, LDLCALC, LDLDIRECT, TRIG, CHOLHDL No results found for: YWVP7T Lab Results  Component Value Date   VITAMINB12 314 08/23/2013   Lab Results  Component Value Date   TSH 1.130 09/29/2017      ASSESSMENT AND PLAN 48 y.o. year old female  has a past medical history of Ankle fracture, right, History of hysterectomy, Knee joint dislocation, Migraines, and Multiple sclerosis (HCC). here with:  1.  Relapsing remitting multiple sclerosis -Symptom onset since 2010 -Tysabri infusions since January 2017 -JC virus antibody February 2020, 0.21, negative inhibition assay -MRI of the brain and cervical spine in October 2020 showed no change compared to previous study, continue to show evidence of MS lesions at cervical spine -Due to report of continued gait abnormality, ocrelizumab was started in October 2020  2.  Worsening gait abnormality -Previously tried Ampyra without significant improvement -May consider referral to physical therapy in the future  3.  Bilateral lower extremity paresthesia -She continues to complain of left lower extremity -She has tried Lamictal, gabapentin, Lyrica, Cymbalta, nortriptyline, baclofen, Ultram, Keppra -will retry Cymbalta 60mg  daily  4.  Chronic insomnia   Clonazepam 0.5 mg as needed  Levert Feinstein, M.D. Ph.D.  [facility] [address] [place], [place] [address] Phone: [phone] Fax:      [phone]

## 2019-03-01 ENCOUNTER — Other Ambulatory Visit: Payer: Self-pay | Admitting: Neurology

## 2019-03-01 MED ORDER — CLONAZEPAM 0.5 MG PO TABS: 0.5000 mg | ORAL_TABLET | Freq: Every evening | ORAL | 5 refills | Status: DC | PRN

## 2019-03-01 MED ORDER — DULOXETINE HCL 60 MG PO CPEP: 60.0000 mg | ORAL_CAPSULE | Freq: Every day | ORAL | 11 refills | Status: DC

## 2019-03-01 NOTE — Telephone Encounter (Signed)
1) Medication(s) Requested (by name): Clonazepam duloxetine 2) Pharmacy of Choice: [facility] on [address]   Patient called to check on her prescriptions states that when she went to pick them up she was told by the pharmacy that the do not have them when verifying pharmacy of choice patient states the pharmacy should be [facility] on [address].    Please follow up.

## 2019-03-01 NOTE — Telephone Encounter (Signed)
Refills will be sent to [facility].

## 2019-03-02 ENCOUNTER — Other Ambulatory Visit: Payer: Self-pay | Admitting: Neurology

## 2019-03-02 MED ORDER — CLONAZEPAM 0.5 MG PO TABS: 0.5000 mg | ORAL_TABLET | Freq: Every evening | ORAL | 1 refills | Status: DC | PRN

## 2019-03-02 NOTE — Telephone Encounter (Signed)
Ok to have shingles vaccination

## 2019-03-02 NOTE — Telephone Encounter (Signed)
[facility] called and refills of Clonazepam have been voided.  New order placed for the requested Dana Duke location.

## 2019-03-02 NOTE — Telephone Encounter (Signed)
Patient called stating she would like to speak to someone in regards to the shingles shot states she has received the flu shot but when inquiring about the shingle shot she was told to follow up with Dr.Yan due to her currently being on ocrevus.   Patient also called to inform that she was unable to pickup the clonazepam. States that it is on back order and her pharmacy will not have it in until another 3 weeks and she is currently calling around to see which pharmacy has it in stock. Please follow up

## 2019-03-02 NOTE — Telephone Encounter (Signed)
I spoke to the patient and she verbalized understanding that she can have the shingles vaccination.

## 2019-03-02 NOTE — Addendum Note (Signed)
Addended by: Noberto Retort C on: 03/02/2019 12:12 PM   Modules accepted: Orders

## 2019-03-02 NOTE — Addendum Note (Signed)
Addended by: Noberto Retort C on: 03/02/2019 12:13 PM   Modules accepted: Orders

## 2019-03-02 NOTE — Addendum Note (Signed)
Addended by: Marcial Pacas on: 03/02/2019 01:20 PM   Modules accepted: Orders

## 2019-03-02 NOTE — Telephone Encounter (Addendum)
Pt has called back to inform that she would like the clonazePAM (KLONOPIN) 0.5 MG tablet called into [facility] [phone] Pt is also asking if Dr Krista Blue will authorize the change of her medications from 30-90 day please call

## 2019-03-09 ENCOUNTER — Ambulatory Visit
Admission: RE | Admit: 2019-03-09 | Discharge: 2019-03-09 | Disposition: A | Discharge status: Home / Self Care | Payer: BLUE CROSS/BLUE SHIELD | Source: Ambulatory Visit | Attending: Family Medicine | Admitting: Family Medicine

## 2019-03-09 ENCOUNTER — Other Ambulatory Visit: Payer: Self-pay

## 2019-03-09 DIAGNOSIS — Z1231 Encounter for screening mammogram for malignant neoplasm of breast: Secondary | ICD-10-CM

## 2019-04-21 ENCOUNTER — Telehealth: Payer: Self-pay | Admitting: Neurology

## 2019-04-21 NOTE — Telephone Encounter (Signed)
Left message requesting a return call.

## 2019-04-21 NOTE — Telephone Encounter (Signed)
Patient called in and stated she has had a headache since last Thursday and it has gotten worse and her PCP advised her to contact the office to speak with someone

## 2019-04-22 ENCOUNTER — Ambulatory Visit (INDEPENDENT_AMBULATORY_CARE_PROVIDER_SITE_OTHER): Payer: BC Managed Care – PPO | Admitting: Neurology

## 2019-04-22 ENCOUNTER — Other Ambulatory Visit: Payer: Self-pay

## 2019-04-22 ENCOUNTER — Encounter: Payer: Self-pay | Admitting: Neurology

## 2019-04-22 VITALS — BP 114/76 | HR 78 | Temp 97.2°F

## 2019-04-22 DIAGNOSIS — G43009 Migraine without aura, not intractable, without status migrainosus: Secondary | ICD-10-CM | POA: Diagnosis not present

## 2019-04-22 DIAGNOSIS — G35 Multiple sclerosis: Secondary | ICD-10-CM | POA: Diagnosis not present

## 2019-04-22 MED ORDER — SUMATRIPTAN SUCCINATE 100 MG PO TABS: 100.0000 mg | ORAL_TABLET | Freq: Once | ORAL | 11 refills | Status: DC | PRN

## 2019-04-22 MED ORDER — ONDANSETRON 4 MG PO TBDP: 4.0000 mg | ORAL_TABLET | Freq: Three times a day (TID) | ORAL | 6 refills | Status: DC | PRN

## 2019-04-22 NOTE — Progress Notes (Signed)
PATIENT: Dana Duke DOB: 09-24-1970  REASON FOR VISIT: follow up HISTORY FROM: patient  HISTORY OF PRESENT ILLNESS: (initial visit was in Nov 2014) Dana Duke is a 49 years old right-handed African American female, referred by Dr. Suann Larry, Janalyn Rouse, she has relapsing remitting multiple sclerosis  Since 2011 she noticed gradual onset slow worsening gait difficulty, she tends to drag her foot across the floor, stiff, has difficulty going upstairs, because of knee pain, she suffered a history of right ankle fracture in 2010, seems to dragging her right foot across the floor more, also complains urinary urgency, no bowel incontinence  MRI at [facility] in May 7th 2015, without contrast  MRI cervical spine (without) demonstrating at least 4 spinal cord T2 hyperintensities at cervico-medullary junction, C2, C3-4 and C6.  MRI brain (without) demonstrating multiple (> 20) round and ovoid, periventricular, callosal and peri-callosal, subcortical, juxtacortical, pontine and cerebellar T2 hyperintensities. Some of these are perpendicular to the lateral ventricles on axial and sagittal views. Some of these are hypointense on T1 views. Findings suspicious for demyelinating disease  CSF showed 5 oligoclonal banding, elevated IgG index  Extensive laboratory evaluations showed normal or negative CBC, CMP, Lyme titer, ANA, NMO, TSH, B12, folic acid, RPR. Positive varicellar zoster antibody  The visual evoked response test above was within normal limits on the left, with prolongation of the P100 latency on the right.   JC virus antibody was 0.15 negative in 10/18/2013  MRI of brain, and cervical spine with contrast, showed no contrast enhancement, there was no lesions noticed in MRI thoracic spine,  She startedPlegridy since July 2015, began full dose injection since Sept 15 2015. Complains of injection site reactio, lasting 2-4 weeks, she only has mild flu like illness   She has  fallen few times, more difficulty walking, when she fell few weeks ago, Sep 15th 2015, she has difficulty getting up, could not move from waist down for 2-3 hours, also noticed slurring of her speech. She has mild fever then  She called Dr. Anne Hahn, had a repeat MRI of the brain Oct 2015, we have reviewed MRI together, continued evidence of periventricular white matter disease consistent with her diagnosis of multiple sclerosis, there was no change compared to previous image in may 2015  She was started on prednisone tapering since October 16,2015, tolerating the medication well, seems to have mild improvement in her gait difficulty  She also complains of frequent headaches, consistent with migraines,   UPDATE Feb 2nd 2016:  Last visit Jan 19th 2016, this is [name] 9-21 visit, she continued to have intermittent headaches, Maxalt 5 mg works most of the time, but sometimes with severe prolonged headaches, repeat dose of Maxalt fail to improve her symptoms, sleep always helps  UPDATE March 3rd 2016: She complains of worsening headaches almost daily, moderate, tried maxalt.without significant improvement, she is taking baclofen ER 30mg , nortriptyline 20mg  qhs. Also with bilateral knee and hip pain, left worse than right, bilateral feet numbness, coldness sensation, which are chronic.  UPDATE May 25th 2016: She is overall doing very well, no significant bilateral lower extremity spasticity over the past few weeks, baseline urinary incontinence, gradually getting worse, accident almost daily basis, she was recently evaluated by her gynecologist, planning on to see a urologist soon   UPDATE Mar 28 2015: She is with her husband daughter at today's clinical visit, we have reviewed previous MS history, she was started on Plegridy in July 2015, had a flareup in Oct 2015, was  treated with steroid, over the past 18 months, she had slow worsening gait difficulty  We have reviewed MRI of the  brain with and without contrast in November 2016: abnormal MRI of the brain with and without contrast showing T2/FLAIR hyperintense foci at the cervicomedullary junction and within the pons, midbrain and cerebellum in the posterior fossa. Additionally, there are many T2/FLAIR hyperintense foci in the cerebral hemispheres, predominantly in the periventricular white matter. The foci are consistent with chronic demyelinating plaques from multiple sclerosis. None of the foci appears to be acute and there are no enhancing lesions.   abnormal MRI of the cervical spine showing T2 hyperintense foci within the spinal cord at the cervicomedullary junction, C2, C3, C5C6 and C6 as detailed above. Additional foci are noted within the pons. None of the foci appears to be acute. No significant degenerative changes are noted. The lesions are consistent with chronic demyelinating plaques as would be seen in multiple sclerosis. The findings are similar to the MRI report from 08/19/2013.  She still has significant better urgency, no bowel incontinence, taking baclofen ER for bilateral lower extremity spasticity,  Migraine headaches: Overall under good control she is taking nortriptyline 20 mg every night, taking Topamax as needed instead of regular 100 mg twice a day Imitrex as needed was helpful  UPDATE June 29 2015: She started [place] infusion since January 2017, she tolerated well, she has much less frequent headaches, Maxalt continued to help, will taper off Topamax, leave her on nortriptyline 20 mg every night, her bilateral lower extremity spasticity has much improved as well, on lower dose of baclofen ER 30 mg daily,  UPDATE August 16th 2017: She complains of increased bilateral lower extremity now taking baclofen 10 mg 3 times a day, also nortriptyline 20 mg every night as migraine prevention, she barely has any headaches now, tolerating Tysarbri infusion very well, continue have baseline gait  abnormality, dragging left leg more, also complains of chronic insomnia,  UPDATE Dec 19th 2017: She complains of left hip and left knee pain, continue mild gait difficulty, tolerating Tysarbri infusion well, no worsening gait abnormality  We have reviewed MRI of the brain without contrast in November 2017:Abnormal MRI scan of the brain with and without contrast showing multiple periventricular, periatrial, subcortical and brainstem white matter hyperintensities consistent with chronic demyelinating disease. No enhancing lesions are noted. Overall no significant change compared with previous MRI scan dated 03/01/2015  UPDATE Dec 6th 2018: She is overall doing very well, continue complaints of fatigue, gait abnormality, left arm and leg deep achy pain, she rarely gets migraine headaches now, take nortriptyline 20 mg at bedtime, tolerating Tysabri IV infusion every month,  I have personally reviewed MRI of the brain in 2018 comparison to 2017, there is no significant change, no contrast enhancement.  UPDATE September 29 2017: She complains of worsening gait abnormality, reported 50% worse compared to 6 months ago, also has worsening urinary urgency, sometimes bladder incontinence, no significant weakness in her arms, also complains of significant left knee pain, was seen by orthopedic surgeon, intra-articular joint injection provided no significant improvement  UPDATE Jan 29 2018: Patient came in earlier than expected for gait abnormality, failed to respond well to Ampyra,  I reviewed the laboratory evaluation in June 2019, normal negative TSH, CMP, anti-Tysabri antibody, negative JC virus titer, CBC showed no significant abnormality.  She hoped to be evaluated for electronic motorized wheelchair, she ambulate without assistant most of the time, but does complains of lack of stamina,  tendency to fall,  UPDATE May 25 2018: She complains of worsening gait abnormality, dragging her left leg more  across the floor  Personally reviewed MRI cervical spine 01/02/2018, demyelinating plaque C2, 3, C5-6 and 7 level, no change compared to MRI in 2016  MRI of the brain with without contrast few small round periventricular subcortical, pontine, and cerebellar demyelinating plaque, no acute findings no change compared to MRI in 2018  Laboratory evaluation showed negative anti-Tysarbri antibody, JC virus antibody pending in February 2020  UPDATE Feb 25 2019: She was able to tolerate ocrelizumab infusion, no significant side effect noted, she continue complains slow worsening gait abnormality. Laboratory evaluation February 03, 2019 showed normal CBC, normal IgG, IgA, IgM, definite B-cell (CD19 plus, CD20 plus) is not identified  I personally reviewed MRI of the brain without contrast February 12, 2019, multiple T2/flair hyperintensity foci in the brainstem, spinal cord, cerebellum, cerebral hemisphere, in a pattern, and configuration, consistent with chronic demyelinating plaque associated with multiple sclerosis.  There was no change compared to previous MRI in November 2019.  No contrast-enhancement  UPDATE Jan 7th 2021: She came to clinic earlier than expected for worsening migraine headaches, she had 7 days of migraine around Christmas time, had recurrent migraine again over the past 4 days, failed multiple home dose of Imitrex, today she still complains 6 out of 10 right temporal area pounding headache, worsening with movement, light noise sensitivity, with mild nausea  Her headache failed to improve with 50 mg  Ubrevyl sample, but much improved after she was given IV Depacon,,along with 10 mg of Compazine Toradol 30 mg  REVIEW OF SYSTEMS: Out of a complete 14 system review of symptoms, the patient complains only of the following symptoms, and all other reviewed systems are negative.  As above  ALLERGIES: Allergies  Allergen Reactions  . Sulfa Antibiotics Nausea And Vomiting    HOME  MEDICATIONS: Outpatient Medications Prior to Visit  Medication Sig Dispense Refill  . atorvastatin (LIPITOR) 20 MG tablet TAKE ONE TABLET BY MOUTH DAILY    . Cholecalciferol 25 MCG (1000 UT) tablet Take 1,000 Units by mouth daily.    . clonazePAM (KLONOPIN) 0.5 MG tablet Take 1 tablet (0.5 mg total) by mouth at bedtime as needed (for insomnia). 90 tablet 1  . DULoxetine (CYMBALTA) 60 MG capsule Take 1 capsule (60 mg total) by mouth daily. 30 capsule 11  . loteprednol (LOTEMAX) 0.5 % ophthalmic suspension     . MYRBETRIQ 50 MG TB24 tablet Take 50 mg by mouth daily.    Marland Kitchen ocrelizumab (OCREVUS) 300 MG/10ML injection Inject into the vein.    Marland Kitchen UNABLE TO FIND Take 1 tablet by mouth 3 (three) times daily. Med Name: Johnney Ou    . UNABLE TO FIND Take 1 tablet by mouth 3 (three) times daily between meals. Med Name: Curalin    . lisinopril (PRINIVIL,ZESTRIL) 2.5 MG tablet   0  . MYRBETRIQ 25 MG TB24 tablet Take 25 mg by mouth daily.     Facility-Administered Medications Prior to Visit  Medication Dose Route Frequency Provider Last Rate Last Admin  . gadopentetate dimeglumine (MAGNEVIST) injection 20 mL  20 mL Intravenous Once PRN Levert Feinstein, MD        PAST MEDICAL HISTORY: Past Medical History:  Diagnosis Date  . Ankle fracture, right   . History of hysterectomy   . Knee joint dislocation    bilateral  . Migraines   . Multiple sclerosis (HCC)     PAST  SURGICAL HISTORY: Past Surgical History:  Procedure Laterality Date  . ABDOMINAL HYSTERECTOMY    . CESAREAN SECTION    . MYOMECTOMY      FAMILY HISTORY: Family History  Problem Relation Age of Onset  . Diabetes Mother   . Osteoarthritis Other   . Heart disease Other   . Rheumatic fever Other   . Hypertension Other     SOCIAL HISTORY: Social History   Socioeconomic History  . Marital status: Married    Spouse name: Elta Guadeloupe  . Number of children: 1  . Years of education: college  . Highest education level: Not on file    Occupational History    Employer: [employer]    Comment: Does not work  Tobacco Use  . Smoking status: Never Smoker  . Smokeless tobacco: Never Used  Substance and Sexual Activity  . Alcohol use: No  . Drug use: No  . Sexual activity: Not on file  Other Topics Concern  . Not on file  Social History Narrative   Patient is married. Elta Guadeloupe).    Patient is unemployed.   Education- College education   Right handed.   Caffeine- None   Social Determinants of Health   Financial Resource Strain:   . Difficulty of Paying Living Expenses: Not on file  Food Insecurity:   . Worried About Charity fundraiser in the Last Year: Not on file  . Ran Out of Food in the Last Year: Not on file  Transportation Needs:   . Lack of Transportation (Medical): Not on file  . Lack of Transportation (Non-Medical): Not on file  Physical Activity:   . Days of Exercise per Week: Not on file  . Minutes of Exercise per Session: Not on file  Stress:   . Feeling of Stress : Not on file  Social Connections:   . Frequency of Communication with Friends and Family: Not on file  . Frequency of Social Gatherings with Friends and Family: Not on file  . Attends Religious Services: Not on file  . Active Member of Clubs or Organizations: Not on file  . Attends Archivist Meetings: Not on file  . Marital Status: Not on file  Intimate Partner Violence:   . Fear of Current or Ex-Partner: Not on file  . Emotionally Abused: Not on file  . Physically Abused: Not on file  . Sexually Abused: Not on file    PHYSICAL EXAM  Vitals:   04/22/19 1026  BP: 114/76  Pulse: 78  Temp: (!) 97.2 F (36.2 C)   There is no height or weight on file to calculate BMI.  Generalized: Well developed, in no acute distress   Neurological examination  Mentation: Alert oriented to time, place, history taking. Follows all commands speech and language fluent Cranial nerve II-XII: Pupils were equal round reactive to  light. Extraocular movements were full, visual field were full on confrontational test. Facial sensation and strength were normal.  Head turning and shoulder shrug  were normal and symmetric. Motor: Mild fixation of left upper extremity on rapid rotating movement, mild spasticity and bilateral hip flexion weakness  Sensory: Length dependent sensory changes Coordination: Cerebellar testing reveals good finger-nose-finger and heel-to-shin bilaterally.  Gait and station: She needs push-up to get up from seated position, wide-based, unsteady Reflexes: Deep tendon reflexes are hyperactive  DIAGNOSTIC DATA (LABS, IMAGING, TESTING) - I reviewed patient records, labs, notes, testing and imaging myself where available.  Lab Results  Component Value Date  WBC 6.8 02/03/2019   HGB 11.7 02/03/2019   HCT 37.2 02/03/2019   MCV 82 02/03/2019   PLT 244 02/03/2019      Component Value Date/Time   NA 141 11/24/2018 1640   K 4.2 11/24/2018 1640   CL 104 11/24/2018 1640   CO2 24 11/24/2018 1640   GLUCOSE 85 11/24/2018 1640   BUN 10 11/24/2018 1640   CREATININE 0.73 11/24/2018 1640   CALCIUM 9.7 11/24/2018 1640   PROT 7.1 11/24/2018 1640   ALBUMIN 4.3 11/24/2018 1640   AST 15 11/24/2018 1640   ALT 13 11/24/2018 1640   ALKPHOS 64 11/24/2018 1640   BILITOT 0.3 11/24/2018 1640   GFRNONAA 96 11/24/2018 1640   GFRAA 111 11/24/2018 1640   No results found for: CHOL, HDL, LDLCALC, LDLDIRECT, TRIG, CHOLHDL No results found for: JMEQ6S Lab Results  Component Value Date   VITAMINB12 314 08/23/2013   Lab Results  Component Value Date   TSH 1.130 09/29/2017      ASSESSMENT AND PLAN 49 y.o. year old female   Worsening chronic migraine headaches  Suggested preventive medication magnesium oxide 400 mg twice a day, riboflavin 100 mg twice a day  Imitrex as needed, may combine it with Zofran, Aleve, for abortive treatment  Relapsing remitting multiple sclerosis, slow worsening gait  abnormality -Symptom onset since 2010 -Tysabri infusions since January 2017 -JC virus antibody February 2020, 0.21, negative inhibition assay -MRI of the brain and cervical spine in October 2020 showed no change compared to previous study, continue to show evidence of MS lesions at cervical spine -Due to report of continued gait abnormality, ocrelizumab was started in October 2020   Levert Feinstein, M.D. Ph.D.  [facility] [address] [place], [place] [address] Phone: [phone] Fax:      [phone]

## 2019-04-22 NOTE — Telephone Encounter (Signed)
Pt has returned the call to RN [facility] call back

## 2019-04-22 NOTE — Telephone Encounter (Signed)
I spoke to the patient.  She had a headache the week of Christmas that subsided.  However, it returned this past Monday.  She tried sumatriptan and Excedrin Migraine with no relief.  She has also has some nausea, bilateral eye (right eye worse than left) and her head pain increases when she is up walking around. She would like to be evaluated in the office.  She has been placed on the schedule this morning.

## 2019-04-22 NOTE — Telephone Encounter (Signed)
Pt's daughter Joyce Gross called stating that the pt may be having a MS flare up and is needing to speak to the nurse. Please advise.

## 2019-04-22 NOTE — Patient Instructions (Signed)
Magnesium oxide 400mg Riboflavin 100mg twice a day 

## 2019-05-03 ENCOUNTER — Telehealth: Payer: Self-pay | Admitting: Neurology

## 2019-05-03 MED ORDER — ONDANSETRON 4 MG PO TBDP: 4.0000 mg | ORAL_TABLET | Freq: Three times a day (TID) | ORAL | 1 refills | Status: DC | PRN

## 2019-05-03 NOTE — Telephone Encounter (Signed)
Pharmacy called in and stated a faxed order for ondansetron (ZOFRAN ODT) 4 MG disintegrating tablet needs to be sent to [facility] - [place], [place] - [address]

## 2019-05-03 NOTE — Telephone Encounter (Signed)
Refills sent to requested pharmacy. 

## 2019-05-20 LAB — CBC WITH DIFFERENTIAL/PLATELET
Basophils Absolute: 0 10*3/uL (ref 0.0–0.2)
Basos: 0 %
Eos: 1 %
Hematocrit: 37.2 % (ref 34.0–46.6)
Hemoglobin: 11.7 g/dL (ref 11.1–15.9)
Immature Grans (Abs): 0 10*3/uL (ref 0.0–0.1)
Immature Granulocytes: 0 %
Lymphocytes Absolute: 2.2 10*3/uL (ref 0.7–3.1)
Lymphs: 32 %
MCH: 25.8 pg — ABNORMAL LOW (ref 26.6–33.0)
MCHC: 31.5 g/dL (ref 31.5–35.7)
MCV: 82 fL (ref 79–97)
Monocytes Absolute: 0.6 10*3/uL (ref 0.1–0.9)
Monocytes: 9 %
Neutrophils Absolute: 3.9 10*3/uL (ref 1.4–7.0)
Neutrophils: 58 %
Platelets: 244 10*3/uL (ref 150–450)
RBC: 4.54 x10E6/uL (ref 3.77–5.28)
RDW: 14.3 % (ref 11.7–15.4)
WBC: 6.8 10*3/uL (ref 3.4–10.8)

## 2019-05-20 LAB — IGG, IGA, IGM
IgA/Immunoglobulin A, Serum: 166 mg/dL (ref 87–352)
IgG (Immunoglobin G), Serum: 1395 mg/dL (ref 586–1602)
IgM (Immunoglobulin M), Srm: 116 mg/dL (ref 26–217)

## 2019-05-20 LAB — CD19 AND CD20, FLOW CYTOMETRY

## 2019-06-28 ENCOUNTER — Telehealth: Payer: Self-pay | Admitting: Neurology

## 2019-06-28 NOTE — Telephone Encounter (Signed)
Per Dr. Terrace Arabia, okay for patient to get COVID-19 vaccination - either Pfizer or Moderna. I have called the patient back to inform her.

## 2019-06-28 NOTE — Telephone Encounter (Signed)
Pt called to know if it is recommended she get the covid vaccine and if so which one.

## 2019-09-01 ENCOUNTER — Ambulatory Visit (INDEPENDENT_AMBULATORY_CARE_PROVIDER_SITE_OTHER): Payer: BC Managed Care – PPO | Admitting: Neurology

## 2019-09-01 ENCOUNTER — Other Ambulatory Visit: Payer: Self-pay

## 2019-09-01 ENCOUNTER — Encounter: Payer: Self-pay | Admitting: Neurology

## 2019-09-01 VITALS — BP 126/79 | HR 65 | Wt 174.0 lb

## 2019-09-01 DIAGNOSIS — G35 Multiple sclerosis: Secondary | ICD-10-CM | POA: Diagnosis not present

## 2019-09-01 DIAGNOSIS — G43009 Migraine without aura, not intractable, without status migrainosus: Secondary | ICD-10-CM

## 2019-09-01 MED ORDER — DULOXETINE HCL 60 MG PO CPEP: 60.0000 mg | ORAL_CAPSULE | Freq: Every day | ORAL | 11 refills | Status: DC

## 2019-09-01 MED ORDER — AIMOVIG 70 MG/ML ~~LOC~~ SOAJ: 70.0000 mg | SUBCUTANEOUS | 11 refills | Status: DC

## 2019-09-01 NOTE — Patient Instructions (Signed)
Try Aimovig for headache prevention  Continue other medications  See you back in 6 months

## 2019-09-01 NOTE — Progress Notes (Signed)
PATIENT: Dana Duke DOB: [birth year]  REASON FOR VISIT: follow up HISTORY FROM: patient  HISTORY OF PRESENT ILLNESS: Today 09/01/19  HISTORY (initial visit was in Nov 2014) Dana Duke is a 49 years old right-handed African American female, referred by Dana Duke, Dana Duke, she has relapsing remitting multiple sclerosis  Since 2011 she noticed gradual onset slow worsening gait difficulty, she tends to drag her foot across the floor, stiff, has difficulty going upstairs, because of knee pain, she suffered a history of right ankle fracture in 2010, seems to dragging her right foot across the floor more, also complains urinary urgency, no bowel incontinence  MRI at [facility] in May 7th 2015, without contrast  MRI cervical spine (without) demonstrating at least 4 spinal cord T2 hyperintensities at cervico-medullary junction, C2, C3-4 and C6.  MRI brain (without) demonstrating multiple (> 20) round and ovoid, periventricular, callosal and peri-callosal, subcortical, juxtacortical, pontine and cerebellar T2 hyperintensities. Some of these are perpendicular to the lateral ventricles on axial and sagittal views. Some of these are hypointense on T1 views. Findings suspicious for demyelinating disease  CSF showed 5 oligoclonal banding, elevated IgG index  Extensive laboratory evaluations showed normal or negative CBC, CMP, Lyme titer, ANA, NMO, TSH, B12, folic acid, RPR. Positive varicellar zoster antibody  The visual evoked response test above was within normal limits on the left, with prolongation of the P100 latency on the right.   JC virus antibody was 0.15 negative in 10/18/2013  MRI of brain, and cervical spine with contrast, showed no contrast enhancement, there was no lesions noticed in MRI thoracic spine,  She startedPlegridy since July 2015, began full dose injection since Sept 15 2015. Complains of injection site reactio, lasting 2-4 weeks, she only has mild flu  like illness   She has fallen few times, more difficulty walking, when she fell few weeks ago, Sep 15th 2015, she has difficulty getting up, could not move from waist down for 2-3 hours, also noticed slurring of her speech. She has mild fever then  She called Dr. Anne Duke, had a repeat MRI of the brain Oct 2015, we have reviewed MRI together, continued evidence of periventricular white matter disease consistent with her diagnosis of multiple sclerosis, there was no change compared to previous image in may 2015  She was started on prednisone tapering since October 16,2015, tolerating the medication well, seems to have mild improvement in her gait difficulty  She also complains of frequent headaches, consistent with migraines,   UPDATE Feb 2nd 2016:  Last visit Jan 19th 2016, this is [name] 9-21 visit, she continued to have intermittent headaches, Maxalt 5 mg works most of the time, but sometimes with severe prolonged headaches, repeat dose of Maxalt fail to improve her symptoms, sleep always helps  UPDATE March 3rd 2016: She complains of worsening headaches almost daily, moderate, tried maxalt.without significant improvement, she is taking baclofen ER 30mg , nortriptyline 20mg  qhs. Also with bilateral knee and hip pain, left worse than right, bilateral feet numbness, coldness sensation, which are chronic.  UPDATE May 25th 2016: She is overall doing very well, no significant bilateral lower extremity spasticity over the past few weeks, baseline urinary incontinence, gradually getting worse, accident almost daily basis, she was recently evaluated by her gynecologist, planning on to see a urologist soon   UPDATE Mar 28 2015: She is with her husband daughter at today's clinical visit, we have reviewed previous MS history, she was started on Plegridy in July 2015, had a flareup  in Oct 2015, was treated with steroid, over the past 18 months, she had slow worsening gait difficulty  We  have reviewed MRI of the brain with and without contrast in November 2016: abnormal MRI of the brain with and without contrast showing T2/FLAIR hyperintense foci at the cervicomedullary junction and within the pons, midbrain and cerebellum in the posterior fossa. Additionally, there are many T2/FLAIR hyperintense foci in the cerebral hemispheres, predominantly in the periventricular white matter. The foci are consistent with chronic demyelinating plaques from multiple sclerosis. None of the foci appears to be acute and there are no enhancing lesions.   abnormal MRI of the cervical spine showing T2 hyperintense foci within the spinal cord at the cervicomedullary junction, C2, C3, C5C6 and C6 as detailed above. Additional foci are noted within the pons. None of the foci appears to be acute. No significant degenerative changes are noted. The lesions are consistent with chronic demyelinating plaques as would be seen in multiple sclerosis. The findings are similar to the MRI report from 08/19/2013.  She still has significant better urgency, no bowel incontinence, taking baclofen ER for bilateral lower extremity spasticity,  Migraine headaches: Overall under good control she is taking nortriptyline 20 mg every night, taking Topamax as needed instead of regular 100 mg twice a day Imitrex as needed was helpful  UPDATE June 29 2015: She started [place] infusion since January 2017, she tolerated well, she has much less frequent headaches, Maxalt continued to help, will taper off Topamax, leave her on nortriptyline 20 mg every night, her bilateral lower extremity spasticity has much improved as well, on lower dose of baclofen ER 30 mg daily,  UPDATE August 16th 2017: She complains of increased bilateral lower extremity now taking baclofen 10 mg 3 times a day, also nortriptyline 20 mg every night as migraine prevention, she barely has any headaches now, tolerating Tysarbri infusion very well,  continue have baseline gait abnormality, dragging left leg more, also complains of chronic insomnia,  UPDATE Dec 19th 2017: She complains of left hip and left knee pain, continue mild gait difficulty, tolerating Tysarbri infusion well, no worsening gait abnormality  We have reviewed MRI of the brain without contrast in November 2017:Abnormal MRI scan of the brain with and without contrast showing multiple periventricular, periatrial, subcortical and brainstem white matter hyperintensities consistent with chronic demyelinating disease. No enhancing lesions are noted. Overall no significant change compared with previous MRI scan dated 03/01/2015  UPDATE Dec 6th 2018: She is overall doing very well, continue complaints of fatigue, gait abnormality, left arm and leg deep achy pain, she rarely gets migraine headaches now, take nortriptyline 20 mg at bedtime, tolerating Tysabri IV infusion every month,  I have personally reviewed MRI of the brain in 2018 comparison to 2017, there is no significant change, no contrast enhancement.  UPDATE September 29 2017: She complains of worsening gait abnormality, reported 50% worse compared to 6 months ago, also has worsening urinary urgency, sometimes bladder incontinence, no significant weakness in her arms, also complains of significant left knee pain, was seen by orthopedic surgeon, intra-articular joint injection provided no significant improvement  UPDATE Jan 29 2018: Patient came in earlier than expected for gait abnormality, failed to respond well to Ampyra,  I reviewed the laboratory evaluation in June 2019, normal negative TSH, CMP, anti-Tysabri antibody, negative JC virus titer, CBC showed no significant abnormality.  She hoped to be evaluated for electronic motorized wheelchair, she ambulate without assistant most of the time, but does complains  of lack of stamina, tendency to fall,  UPDATE May 25 2018: She complains of worsening gait  abnormality, dragging her left leg more across the floor  Personally reviewed MRI cervical spine 01/02/2018, demyelinating plaque C2, 3, C5-6 and 7 level, no change compared to MRI in 2016  MRI of the brain with without contrast few small round periventricular subcortical, pontine, and cerebellar demyelinating plaque, no acute findings no change compared to MRI in 2018  Laboratory evaluation showed negative anti-Tysarbri antibody, JC virus antibody pending in February 2020  UPDATE Feb 25 2019: She was able to tolerate ocrelizumab infusion, no significant side effect noted, she continue complains slow worsening gait abnormality. Laboratory evaluation February 03, 2019 showed normal CBC, normal IgG, IgA, IgM, definite B-cell (CD19 plus, CD20 plus) is not identified  I personally reviewed MRI of the brain without contrast February 12, 2019, multiple T2/flair hyperintensity foci in the brainstem, spinal cord, cerebellum, cerebral hemisphere, in a pattern, and configuration, consistent with chronic demyelinating plaque associated with multiple sclerosis.  There was no change compared to previous MRI in November 2019.  No contrast-enhancement  UPDATE Jan 7th 2021: She came to clinic earlier than expected for worsening migraine headaches, she had 7 days of migraine around Christmas time, had recurrent migraine again over the past 4 days, failed multiple home dose of Imitrex, today she still complains 6 out of 10 right temporal area pounding headache, worsening with movement, light noise sensitivity, with mild nausea  Her headache failed to improve with 50 mg  Ubrevyl sample, but much improved after she was given IV Depacon,,along with 10 mg of Compazine Toradol 30 mg  Update Sep 01, 2019 SS: She remains on Ocrevus, last infusion was in April.  MS is overall stable.  She continues to have the chronic burning pain in her left knee.  No new or worsening MS symptoms.  For migraine, continues to report  for headaches a week, Imitrex is somewhat helpful.  I do not think she started the magnesium or riboflavin.  Feels her migraines are worsening.  Using a cane.  She is prescribed clonazepam, Cymbalta, Zofran, and Imitrex.  She is unaccompanied today, upbeat very pleasant.  REVIEW OF SYSTEMS: Out of a complete 14 system review of symptoms, the patient complains only of the following symptoms, and all other reviewed systems are negative.  Headache, walking difficulty  ALLERGIES: Allergies  Allergen Reactions  . Sulfa Antibiotics Nausea And Vomiting    HOME MEDICATIONS: Outpatient Medications Prior to Visit  Medication Sig Dispense Refill  . atorvastatin (LIPITOR) 20 MG tablet TAKE ONE TABLET BY MOUTH DAILY    . Cholecalciferol 25 MCG (1000 UT) tablet Take 1,000 Units by mouth daily.    . clonazePAM (KLONOPIN) 0.5 MG tablet Take 1 tablet (0.5 mg total) by mouth at bedtime as needed (for insomnia). 90 tablet 1  . DULoxetine (CYMBALTA) 60 MG capsule Take 1 capsule (60 mg total) by mouth daily. 30 capsule 11  . MYRBETRIQ 50 MG TB24 tablet Take 50 mg by mouth daily.    Marland Kitchen ocrelizumab (OCREVUS) 300 MG/10ML injection Inject into the vein.    Marland Kitchen ondansetron (ZOFRAN ODT) 4 MG disintegrating tablet Take 1 tablet (4 mg total) by mouth every 8 (eight) hours as needed. #20 per month. 60 tablet 1  . SUMAtriptan (IMITREX) 100 MG tablet Take 1 tablet (100 mg total) by mouth once as needed for up to 1 dose for migraine. May repeat in 2 hours if headache persists or recurs.  12 tablet 11  . UNABLE TO FIND Take 1 tablet by mouth 3 (three) times daily. Med Name: Johnney Ou    . UNABLE TO FIND Take 1 tablet by mouth 3 (three) times daily between meals. Med Name: Curalin    . loteprednol (LOTEMAX) 0.5 % ophthalmic suspension      Facility-Administered Medications Prior to Visit  Medication Dose Route Frequency Provider Last Rate Last Admin  . gadopentetate dimeglumine (MAGNEVIST) injection 20 mL  20 mL Intravenous Once  PRN Levert Feinstein, MD        PAST MEDICAL HISTORY: Past Medical History:  Diagnosis Date  . Ankle fracture, right   . History of hysterectomy   . Knee joint dislocation    bilateral  . Migraines   . Multiple sclerosis ([facility])     PAST SURGICAL HISTORY: Past Surgical History:  Procedure Laterality Date  . ABDOMINAL HYSTERECTOMY    . CESAREAN SECTION    . MYOMECTOMY      FAMILY HISTORY: Family History  Problem Relation Age of Onset  . Diabetes Mother   . Osteoarthritis Other   . Heart disease Other   . Rheumatic fever Other   . Hypertension Other     SOCIAL HISTORY: Social History   Socioeconomic History  . Marital status: Married    Spouse name: [name]  . Number of children: 1  . Years of education: college  . Highest education level: Not on file  Occupational History    Employer:  [employer]    Comment: Does not work  Tobacco Use  . Smoking status: Never Smoker  . Smokeless tobacco: Never Used  Substance and Sexual Activity  . Alcohol use: No  . Drug use: No  . Sexual activity: Not on file  Other Topics Concern  . Not on file  Social History Narrative   Patient is married. [name]).    Patient is unemployed.   Education- College education   Right handed.   Caffeine- None   Social Determinants of Health   Financial Resource Strain:   . Difficulty of Paying Living Expenses:   Food Insecurity:   . Worried About Programme researcher, broadcasting/film/video in the Last Year:   . Barista in the Last Year:   Transportation Needs:   . Freight forwarder (Medical):   Marland Kitchen Lack of Transportation (Non-Medical):   Physical Activity:   . Days of Exercise per Week:   . Minutes of Exercise per Session:   Stress:   . Feeling of Stress :   Social Connections:   . Frequency of Communication with Friends and Family:   . Frequency of Social Gatherings with Friends and Family:   . Attends Religious Services:   . Active Member of Clubs or Organizations:   . Attends Tax inspector Meetings:   Marland Kitchen Marital Status:   Intimate Partner Violence:   . Fear of Current or Ex-Partner:   . Emotionally Abused:   Marland Kitchen Physically Abused:   . Sexually Abused:    PHYSICAL EXAM  Vitals:   09/01/19 1454  BP: 126/79  Pulse: 65  Weight: 174 lb (78.9 kg)   Body mass index is 28.08 kg/m.  Generalized: Well developed, in no acute distress   Neurological examination  Mentation: Alert oriented to time, place, history taking. Follows all commands speech and language fluent Cranial nerve II-XII: Pupils were equal round reactive to light. Extraocular movements were full, visual field were full on confrontational test. Facial sensation and  strength were normal. Head turning and shoulder shrug  were normal and symmetric. Motor: Slight weakness of left upper extremity, and bilateral hip flexion weakness Sensory: Sensory testing is intact to soft touch on all 4 extremities. No evidence of extinction is noted.  Coordination: Cerebellar testing reveals good finger-nose-finger and heel-to-shin bilaterally.  Gait and station: Has to push off from seated position, wide-based, unsteady, uses cane Reflexes: Deep tendon reflexes are symmetric but increased throughout  DIAGNOSTIC DATA (LABS, IMAGING, TESTING) - I reviewed patient records, labs, notes, testing and imaging myself where available.  Lab Results  Component Value Date   WBC 6.8 02/03/2019   HGB 11.7 02/03/2019   HCT 37.2 02/03/2019   MCV 82 02/03/2019   PLT 244 02/03/2019      Component Value Date/Time   NA 141 11/24/2018 1640   K 4.2 11/24/2018 1640   CL 104 11/24/2018 1640   CO2 24 11/24/2018 1640   GLUCOSE 85 11/24/2018 1640   BUN 10 11/24/2018 1640   CREATININE 0.73 11/24/2018 1640   CALCIUM 9.7 11/24/2018 1640   PROT 7.1 11/24/2018 1640   ALBUMIN 4.3 11/24/2018 1640   AST 15 11/24/2018 1640   ALT 13 11/24/2018 1640   ALKPHOS 64 11/24/2018 1640   BILITOT 0.3 11/24/2018 1640   GFRNONAA 96 11/24/2018 1640     GFRAA 111 11/24/2018 1640   No results found for: CHOL, HDL, LDLCALC, LDLDIRECT, TRIG, CHOLHDL No results found for: HGBA1C Lab Results  Component Value Date   VITAMINB12 314 08/23/2013   Lab Results  Component Value Date   TSH 1.130 09/29/2017      ASSESSMENT AND PLAN 49 y.o. year old female  has a past medical history of Ankle fracture, right, History of hysterectomy, Knee joint dislocation, Migraines, and Multiple sclerosis ([place]). here with:  1.  Relapsing remitting multiple sclerosis, slow worsening gait abnormality -Overall, stable, no worsening symptoms -Symptom onset since 2010 -Tysabri infusions since Jan 2017 -JC virus antibody February 2020, 0.21, negative inhibition assay -MRI of the brain and cervical spine in October 2020 showed no change compared to previous study, continues to evidence of MS lesions at cervical spine -Due to reported continued gait abnormality, Ocrevus was started in October 2020 -Will check routine lab work today, IgG, IgA, IgM was done in October was within normal limits -Continue Cymbalta 60 mg daily for achy pain, mostly for left knee -Follow-up 6 months or sooner if needed  2.  Worsening chronic migraine headaches -Reports on average 4 headaches a week -Has previously tried and failed nortriptyline, Topamax, Cymbalta -We will try Aimovig 70 mg monthly injection for acute headache -Imitrex as needed, may combine with Zofran, Aleve for acute treatment   I spent 30 minutes of face-to-face and non-face-to-face time with patient.  This included previsit chart review, lab review, study review, order entry, electronic health record documentation, patient education.   Butler Denmark, AGNP-C, DNP 09/01/2019, 3:13 PM [facility] [address] [place], [place] [address] [phone]

## 2019-09-02 ENCOUNTER — Telehealth: Payer: Self-pay

## 2019-09-02 ENCOUNTER — Encounter: Payer: Self-pay | Admitting: *Deleted

## 2019-09-02 ENCOUNTER — Telehealth: Payer: Self-pay | Admitting: *Deleted

## 2019-09-02 LAB — CBC WITH DIFFERENTIAL/PLATELET
Basophils Absolute: 0 10*3/uL (ref 0.0–0.2)
Basos: 0 %
EOS (ABSOLUTE): 0.1 10*3/uL (ref 0.0–0.4)
Eos: 1 %
Hematocrit: 41.1 % (ref 34.0–46.6)
Hemoglobin: 12.8 g/dL (ref 11.1–15.9)
Immature Grans (Abs): 0 10*3/uL (ref 0.0–0.1)
Immature Granulocytes: 0 %
Lymphocytes Absolute: 2.2 10*3/uL (ref 0.7–3.1)
Lymphs: 32 %
MCH: 26.5 pg — ABNORMAL LOW (ref 26.6–33.0)
MCHC: 31.1 g/dL — ABNORMAL LOW (ref 31.5–35.7)
MCV: 85 fL (ref 79–97)
Monocytes Absolute: 0.6 10*3/uL (ref 0.1–0.9)
Monocytes: 9 %
Neutrophils Absolute: 3.8 10*3/uL (ref 1.4–7.0)
Neutrophils: 58 %
Platelets: 270 10*3/uL (ref 150–450)
RBC: 4.83 x10E6/uL (ref 3.77–5.28)
RDW: 14.3 % (ref 11.7–15.4)
WBC: 6.8 10*3/uL (ref 3.4–10.8)

## 2019-09-02 LAB — COMPREHENSIVE METABOLIC PANEL
ALT: 16 IU/L (ref 0–32)
AST: 20 IU/L (ref 0–40)
Albumin/Globulin Ratio: 1.6 (ref 1.2–2.2)
Albumin: 4.4 g/dL (ref 3.8–4.8)
Alkaline Phosphatase: 59 IU/L (ref 48–121)
BUN/Creatinine Ratio: 13 (ref 9–23)
BUN: 11 mg/dL (ref 6–24)
Bilirubin Total: <0.2 mg/dL (ref 0.0–1.2)
CO2: 26 mmol/L (ref 20–29)
Calcium: 9.6 mg/dL (ref 8.7–10.2)
Chloride: 103 mmol/L (ref 96–106)
Creatinine, Ser: 0.85 mg/dL (ref 0.57–1.00)
GFR calc Af Amer: 92 mL/min/{1.73_m2} (ref >59)
GFR calc non Af Amer: 80 mL/min/{1.73_m2} (ref >59)
Globulin, Total: 2.8 g/dL (ref 1.5–4.5)
Glucose: 96 mg/dL (ref 65–99)
Potassium: 4.4 mmol/L (ref 3.5–5.2)
Sodium: 142 mmol/L (ref 134–144)
Total Protein: 7.2 g/dL (ref 6.0–8.5)

## 2019-09-02 NOTE — Telephone Encounter (Addendum)
-----   Message from Glean Salvo, NP sent at 09/02/2019  5:56 AM EDT ----- Please let the patient know, labs are unremarkable.  ---- Attempt made to contact the patient was unsuccessful.  LM on the VM for the pt to call back for her results above from Margie Ege NP

## 2019-09-02 NOTE — Telephone Encounter (Signed)
Aimovig 70 mg PA., key: [id number]. [id number]. failed: maxalt, topamax, imitrex, nortriptyline, cymbalta, ubrelvy.   Your request has been approved Effective from 09/02/2019 through 11/30/2019. Approval faxed to [facility].  Sent her my chart to inform.

## 2019-10-09 ENCOUNTER — Encounter (HOSPITAL_BASED_OUTPATIENT_CLINIC_OR_DEPARTMENT_OTHER): Payer: Self-pay | Admitting: Emergency Medicine

## 2019-10-09 ENCOUNTER — Emergency Department (HOSPITAL_BASED_OUTPATIENT_CLINIC_OR_DEPARTMENT_OTHER)
Admission: EM | Admit: 2019-10-09 | Discharge: 2019-10-09 | Disposition: A | Discharge status: Home / Self Care | Payer: BC Managed Care – PPO | Attending: Emergency Medicine | Admitting: Emergency Medicine

## 2019-10-09 DIAGNOSIS — K0889 Other specified disorders of teeth and supporting structures: Secondary | ICD-10-CM | POA: Diagnosis not present

## 2019-10-09 MED ORDER — ACETAMINOPHEN 500 MG PO TABS
1000.0000 mg | ORAL_TABLET | Freq: Four times a day (QID) | ORAL | 0 refills | Status: DC | PRN
Start: 2019-10-09 — End: 2020-12-05

## 2019-10-09 MED ORDER — PENICILLIN V POTASSIUM 500 MG PO TABS
500.0000 mg | ORAL_TABLET | Freq: Four times a day (QID) | ORAL | 0 refills | Status: AC
Start: 2019-10-09 — End: 2019-10-16

## 2019-10-09 MED ORDER — HYDROCODONE-ACETAMINOPHEN 5-325 MG PO TABS
2.0000 | ORAL_TABLET | Freq: Once | ORAL | Status: AC
  Administered 2019-10-09: 2 via ORAL
  Filled 2019-10-09: qty 2

## 2019-10-09 NOTE — ED Notes (Signed)
Pt roomed to room 11 by wheelchair and given warm blanket at her request.

## 2019-10-09 NOTE — ED Provider Notes (Signed)
Dana Duke   CSN: [id number] Arrival date & time: 10/09/19  1821     History Chief Complaint  Patient presents with  . Facial Pain    Dana Duke is a 49 y.o. female.  49 year old female with past medical history below including multiple sclerosis who presents with right facial pain.  Patient had a routine dental exam 3 days ago which was normal.  2 days ago, she began having pain on her right face that seems to be coming from her right upper molars.  Pain is worse with chewing and worse with hot and cold.  She has tried 800 mg ibuprofen without relief.  Pain worsened tonight which is what prompted her to come in.  She denies any nasal congestion, sinus pressure, ear pain, fevers, or recent illness.  The history is provided by the patient.       Past Medical History:  Diagnosis Date  . Ankle fracture, right   . History of hysterectomy   . Knee joint dislocation    bilateral  . Migraines   . Multiple sclerosis [facility])     Patient Active Problem List   Diagnosis Date Noted  . Spasticity 06/29/2015  . ECG abnormality 06/23/2014  . Migraine without aura and without status migrainosus, not intractable 06/16/2014  . Nonspecific abnormal electrocardiogram (ECG) (EKG) 06/14/2014  . Headache 10/28/2013  . Multiple sclerosis ([place]) 08/23/2013  . Gait difficulty 02/22/2013    Past Surgical History:  Procedure Laterality Date  . ABDOMINAL HYSTERECTOMY    . CESAREAN SECTION    . MYOMECTOMY       OB History    Gravida  2   Para  1   Term  1   Preterm      AB  1   Living  1     SAB      TAB  1   Ectopic      Multiple      Live Births              Family History  Problem Relation Age of Onset  . Diabetes Mother   . Osteoarthritis Other   . Heart disease Other   . Rheumatic fever Other   . Hypertension Other     Social History   Tobacco Use  . Smoking status: Never Smoker  . Smokeless tobacco:  Never Used  Substance Use Topics  . Alcohol use: No  . Drug use: No    Home Medications Prior to Admission medications   Medication Sig Start Date End Date Taking? Authorizing Provider  acetaminophen (TYLENOL) 500 MG tablet Take 2 tablets (1,000 mg total) by mouth every 6 (six) hours as needed. 10/09/19   Cowan Pilar, Wenda Overland, MD  atorvastatin (LIPITOR) 20 MG tablet TAKE ONE TABLET BY MOUTH DAILY 04/30/16   [provider]  Cholecalciferol 25 MCG (1000 UT) tablet Take 1,000 Units by mouth daily.    [provider]  clonazePAM (KLONOPIN) 0.5 MG tablet Take 1 tablet (0.5 mg total) by mouth at bedtime as needed (for insomnia). 03/02/19   Marcial Pacas, MD  DULoxetine (CYMBALTA) 60 MG capsule Take 1 capsule (60 mg total) by mouth daily. 09/01/19   Suzzanne Cloud, NP  Erenumab-aooe (AIMOVIG) 70 MG/ML SOAJ Inject 70 mg into the skin every 30 (thirty) days. 09/01/19   Suzzanne Cloud, NP  MYRBETRIQ 50 MG TB24 tablet Take 50 mg by mouth daily. 04/08/19   [provider]  ocrelizumab (OCREVUS) 300 MG/10ML injection Inject into the vein.    [provider]  ondansetron (ZOFRAN ODT) 4 MG disintegrating tablet Take 1 tablet (4 mg total) by mouth every 8 (eight) hours as needed. #20 per month. 05/03/19   Levert Feinstein, MD  penicillin v potassium (VEETID) 500 MG tablet Take 1 tablet (500 mg total) by mouth 4 (four) times daily for 7 days. 10/09/19 10/16/19  Wofford Stratton, Ambrose Finland, MD  SUMAtriptan (IMITREX) 100 MG tablet Take 1 tablet (100 mg total) by mouth once as needed for up to 1 dose for migraine. May repeat in 2 hours if headache persists or recurs. 04/22/19   Levert Feinstein, MD  UNABLE TO FIND Take 1 tablet by mouth 3 (three) times daily. Med Name: [facility]    [provider]  UNABLE TO FIND Take 1 tablet by mouth 3 (three) times daily between meals. Med Name: Curalin    [provider]    Allergies    Sulfa antibiotics  Review of Systems   Review of Systems    Constitutional: Negative for fever.  HENT: Positive for dental problem. Negative for congestion, ear pain and facial swelling.   Skin: Negative for color change.    Physical Exam Updated Vital Signs BP 134/81 (BP Location: Right Arm)   Pulse (!) 58   Temp 98.6 F (37 C) (Oral)   Resp 18   Ht 5\' 6"  (1.676 m)   Wt 80.3 kg   SpO2 100%   BMI 28.57 kg/m   Physical Exam Vitals and nursing Duke reviewed.  Constitutional:      General: She is not in acute distress.    Appearance: She is well-developed.  HENT:     Head: Normocephalic and atraumatic.     Comments: No facial swelling/asymmetry and no skin changes on face    Right Ear: Tympanic membrane and ear canal normal.     Left Ear: Tympanic membrane and ear canal normal.     Nose: Nose normal.     Mouth/Throat:     Mouth: Mucous membranes are moist.     Comments: Right upper molars and premolars with multiple old fillings, no obvious mucosal swelling or drainage Eyes:     Conjunctiva/sclera: Conjunctivae normal.  Musculoskeletal:     Cervical back: Neck supple.  Skin:    General: Skin is warm and dry.  Neurological:     Mental Status: She is alert and oriented to person, place, and time.  Psychiatric:        Judgment: Judgment normal.     ED Results / Procedures / Treatments   Labs (all labs ordered are listed, but only abnormal results are displayed) Labs Reviewed - No data to display  EKG None  Radiology No results found.  Procedures Procedures (including critical care time)  Medications Ordered in ED Medications  HYDROcodone-acetaminophen (NORCO/VICODIN) 5-325 MG per tablet 2 tablet (has no administration in time range)    ED Course  I have reviewed the triage vital signs and the nursing notes.    MDM Rules/Calculators/A&P                          No obvious odontogenic abscess on exam but given the persistence of her pain, she may have early dental infection.  She also may be having problem with  one of her old fillings.  I have recommended starting on penicillin and contacting dentist for close follow-up.  Have reviewed  return precautions including mechanical soft diet, chewing on left side only, and avoidance of hot and cold liquids.  Return precautions reviewed. Final Clinical Impression(s) / ED Diagnoses Final diagnoses:  Pain, dental    Rx / DC Orders ED Discharge Orders         Ordered    penicillin v potassium (VEETID) 500 MG tablet  4 times daily     Discontinue  Reprint     10/09/19 2114    acetaminophen (TYLENOL) 500 MG tablet  Every 6 hours PRN     Discontinue  Reprint     10/09/19 2114           Byron Tipping, Ambrose Finland, MD 10/09/19 2119

## 2019-10-09 NOTE — ED Triage Notes (Signed)
Pain to R side of face since yesterday. She doesn't think it is dental related. Taking ibuprofen without relief. States she can't eat or sleep

## 2019-10-10 ENCOUNTER — Encounter: Payer: Self-pay | Admitting: Neurology

## 2019-10-11 ENCOUNTER — Telehealth: Payer: Self-pay | Admitting: Neurology

## 2019-10-11 NOTE — Telephone Encounter (Signed)
Patient called in this morning about this issue. FYI

## 2019-10-11 NOTE — Telephone Encounter (Signed)
MyChart message from patient: Dana Duke, on Friday the right side of my face/jaw began to hurt. It continued to progress and I was not able to sleep. On Saturday morning the pain was more than a 10 out of 10. I called around and drove to several dental offices as I was not sure if it was dental or not. The pain became unbearable so I went to the Emergency room. There they told me to take ibuprofen and Tylenol interchangeably and also penicillin just in case. Today the pain is still there, nothing is helping. At the ED they told me to contact my neurologist and dentist ASAP. I am hoping to get your or Dr. Zannie Cove assistance on what I should do. Thank you.   Please call and check on patient, find out if she was able to follow-up with her dentist.  We follow her for MS, is on Ocrevus, also for migraines. Has not reported this right facial/jaw pain.  Reviewed ER note, could have early dental infection, or problem with old filling, she was started on penicillin. She may need to start with Dentist, if no abnormality is found, may need evaluation for possibly [place]?

## 2019-10-12 NOTE — Telephone Encounter (Signed)
Please put her on my schedule for next available

## 2019-10-14 ENCOUNTER — Other Ambulatory Visit: Payer: Self-pay

## 2019-10-14 ENCOUNTER — Ambulatory Visit (INDEPENDENT_AMBULATORY_CARE_PROVIDER_SITE_OTHER): Payer: BC Managed Care – PPO | Admitting: Neurology

## 2019-10-14 ENCOUNTER — Encounter: Payer: Self-pay | Admitting: Neurology

## 2019-10-14 VITALS — BP 116/80 | HR 88 | Ht 66.0 in | Wt 176.0 lb

## 2019-10-14 DIAGNOSIS — G35 Multiple sclerosis: Secondary | ICD-10-CM

## 2019-10-14 NOTE — Progress Notes (Signed)
PATIENT: Dana Duke DOB: 02-03-1971  REASON FOR VISIT: follow up HISTORY FROM: patient  HISTORY OF PRESENT ILLNESS: Today 10/14/19  HISTORY  (initial visit was in Nov 2014) [name] is a 49 years old right-handed African American female, referred by Dr. Suann Larry, Janalyn Rouse, she has relapsing remitting multiple sclerosis  Since 2011 she noticed gradual onset slow worsening gait difficulty, she tends to drag her foot across the floor, stiff, has difficulty going upstairs, because of knee pain, she suffered a history of right ankle fracture in 2010, seems to dragging her right foot across the floor more, also complains urinary urgency, no bowel incontinence  MRI at [facility] in May 7th 2015, without contrast  MRI cervical spine (without) demonstrating at least 4 spinal cord T2 hyperintensities at cervico-medullary junction, C2, C3-4 and C6.  MRI brain (without) demonstrating multiple (> 20) round and ovoid, periventricular, callosal and peri-callosal, subcortical, juxtacortical, pontine and cerebellar T2 hyperintensities. Some of these are perpendicular to the lateral ventricles on axial and sagittal views. Some of these are hypointense on T1 views. Findings suspicious for demyelinating disease  CSF showed 5 oligoclonal banding, elevated IgG index  Extensive laboratory evaluations showed normal or negative CBC, CMP, Lyme titer, ANA, NMO, TSH, B12, folic acid, RPR. Positive varicellar zoster antibody  The visual evoked response test above was within normal limits on the left, with prolongation of the P100 latency on the right.   JC virus antibody was 0.15 negative in 10/18/2013  MRI of brain, and cervical spine with contrast, showed no contrast enhancement, there was no lesions noticed in MRI thoracic spine,  She startedPlegridy since July 2015, began full dose injection since Sept 15 2015. Complains of injection site reactio, lasting 2-4 weeks, she only has mild  flu like illness   She has fallen few times, more difficulty walking, when she fell few weeks ago, Sep 15th 2015, she has difficulty getting up, could not move from waist down for 2-3 hours, also noticed slurring of her speech. She has mild fever then  She called Dr. Anne Hahn, had a repeat MRI of the brain Oct 2015, we have reviewed MRI together, continued evidence of periventricular white matter disease consistent with her diagnosis of multiple sclerosis, there was no change compared to previous image in may 2015  She was started on prednisone tapering since October 16,2015, tolerating the medication well, seems to have mild improvement in her gait difficulty  She also complains of frequent headaches, consistent with migraines,   UPDATE Feb 2nd 2016:  Last visit Jan 19th 2016, this is [name] 9-21 visit, she continued to have intermittent headaches, Maxalt 5 mg works most of the time, but sometimes with severe prolonged headaches, repeat dose of Maxalt fail to improve her symptoms, sleep always helps  UPDATE March 3rd 2016: She complains of worsening headaches almost daily, moderate, tried maxalt.without significant improvement, she is taking baclofen ER 30mg , nortriptyline 20mg  qhs. Also with bilateral knee and hip pain, left worse than right, bilateral feet numbness, coldness sensation, which are chronic.  UPDATE May 25th 2016: She is overall doing very well, no significant bilateral lower extremity spasticity over the past few weeks, baseline urinary incontinence, gradually getting worse, accident almost daily basis, she was recently evaluated by her gynecologist, planning on to see a urologist soon   UPDATE Mar 28 2015: She is with her husband daughter at today's clinical visit, we have reviewed previous MS history, she was started on Plegridy in July 2015, had a  flareup in Oct 2015, was treated with steroid, over the past 18 months, she had slow worsening gait difficulty  We  have reviewed MRI of the brain with and without contrast in November 2016: abnormal MRI of the brain with and without contrast showing T2/FLAIR hyperintense foci at the cervicomedullary junction and within the pons, midbrain and cerebellum in the posterior fossa. Additionally, there are many T2/FLAIR hyperintense foci in the cerebral hemispheres, predominantly in the periventricular white matter. The foci are consistent with chronic demyelinating plaques from multiple sclerosis. None of the foci appears to be acute and there are no enhancing lesions.   abnormal MRI of the cervical spine showing T2 hyperintense foci within the spinal cord at the cervicomedullary junction, C2, C3, C5C6 and C6 as detailed above. Additional foci are noted within the pons. None of the foci appears to be acute. No significant degenerative changes are noted. The lesions are consistent with chronic demyelinating plaques as would be seen in multiple sclerosis. The findings are similar to the MRI report from 08/19/2013.  She still has significant better urgency, no bowel incontinence, taking baclofen ER for bilateral lower extremity spasticity,  Migraine headaches: Overall under good control she is taking nortriptyline 20 mg every night, taking Topamax as needed instead of regular 100 mg twice a day Imitrex as needed was helpful  UPDATE June 29 2015: She started [place] infusion since January 2017, she tolerated well, she has much less frequent headaches, Maxalt continued to help, will taper off Topamax, leave her on nortriptyline 20 mg every night, her bilateral lower extremity spasticity has much improved as well, on lower dose of baclofen ER 30 mg daily,  UPDATE August 16th 2017: She complains of increased bilateral lower extremity now taking baclofen 10 mg 3 times a day, also nortriptyline 20 mg every night as migraine prevention, she barely has any headaches now, tolerating Tysarbri infusion very well,  continue have baseline gait abnormality, dragging left leg more, also complains of chronic insomnia,  UPDATE Dec 19th 2017: She complains of left hip and left knee pain, continue mild gait difficulty, tolerating Tysarbri infusion well, no worsening gait abnormality  We have reviewed MRI of the brain without contrast in November 2017:Abnormal MRI scan of the brain with and without contrast showing multiple periventricular, periatrial, subcortical and brainstem white matter hyperintensities consistent with chronic demyelinating disease. No enhancing lesions are noted. Overall no significant change compared with previous MRI scan dated 03/01/2015  UPDATE Dec 6th 2018: She is overall doing very well, continue complaints of fatigue, gait abnormality, left arm and leg deep achy pain, she rarely gets migraine headaches now, take nortriptyline 20 mg at bedtime, tolerating Tysabri IV infusion every month,  I have personally reviewed MRI of the brain in 2018 comparison to 2017, there is no significant change, no contrast enhancement.  UPDATE September 29 2017: She complains of worsening gait abnormality, reported 50% worse compared to 6 months ago, also has worsening urinary urgency, sometimes bladder incontinence, no significant weakness in her arms, also complains of significant left knee pain, was seen by orthopedic surgeon, intra-articular joint injection provided no significant improvement  UPDATE Jan 29 2018: Patient came in earlier than expected for gait abnormality, failed to respond well to Ampyra,  I reviewed the laboratory evaluation in June 2019, normal negative TSH, CMP, anti-Tysabri antibody, negative JC virus titer, CBC showed no significant abnormality.  She hoped to be evaluated for electronic motorized wheelchair, she ambulate without assistant most of the time, but does  complains of lack of stamina, tendency to fall,  UPDATE May 25 2018: She complains of worsening gait  abnormality, dragging her left leg more across the floor  Personally reviewed MRI cervical spine 01/02/2018, demyelinating plaque C2, 3, C5-6 and 7 level, no change compared to MRI in 2016  MRI of the brain with without contrast few small round periventricular subcortical, pontine, and cerebellar demyelinating plaque, no acute findings no change compared to MRI in 2018  Laboratory evaluation showed negative anti-Tysarbri antibody, JC virus antibody pending in February 2020  UPDATE Feb 25 2019: She was able to tolerate ocrelizumab infusion, no significant side effect noted, she continue complains slow worsening gait abnormality. Laboratory evaluation February 03, 2019 showed normal CBC, normal IgG, IgA, IgM, definite B-cell (CD19 plus, CD20 plus) is not identified  I personally reviewed MRI of the brain without contrast February 12, 2019, multiple T2/flair hyperintensity foci in the brainstem, spinal cord, cerebellum, cerebral hemisphere, in a pattern, and configuration, consistent with chronic demyelinating plaque associated with multiple sclerosis. There was no change compared to previous MRI in November 2019. No contrast-enhancement  UPDATE Jan 7th 2021: She came to clinic earlier than expected for worsening migraine headaches, she had 7 days of migraine around Christmas time, had recurrent migraine again over the past 4 days, failed multiple home dose of Imitrex, today she still complains 6 out of 10 right temporal area pounding headache, worsening with movement, light noise sensitivity, with mild nausea  Her headache failed to improve with 50 mgUbrevylsample, but much improved after she was given IV Depacon,,along with 10 mg of Compazine Toradol 30 mg  Update Sep 01, 2019 SS: She remains on Ocrevus, last infusion was in April.  MS is overall stable.  She continues to have the chronic burning pain in her left knee.  No new or worsening MS symptoms.  For migraine, continues to report  for headaches a week, Imitrex is somewhat helpful.  I do not think she started the magnesium or riboflavin.  Feels her migraines are worsening.  Using a cane.  She is prescribed clonazepam, Cymbalta, Zofran, and Imitrex.  She is unaccompanied today, upbeat very pleasant.  Update October 14, 2019 SS: Appointment today for new problem, reporting bowel incontinence.  Was in the ER recently for dental pain, had tooth extraction, is doing better  She is on Ocrevus since October 2020, last infusion was in April 2021.  Most recent MRI of the brain in October 2020 was overall stable.  She has MS lesions involving the brainstem, spinal cord, cerebellum, and cerebral hemisphere, from MRI of cervical spine in November 2019.  Indicates has noticed bowel incontinence for the last 3 weeks, will sit down to urinate and notice small BM in toilet, or pull her adult brief down, notice small ball of stool.  Has no sensation of this.  Continues with urinary incontinence.  No recent illness, fever, chills.  No UTI sx. and is overall stable, denies any new numbness, weakness, sensory alteration, changes in the walking.  Here today with husband.  REVIEW OF SYSTEMS: Out of a complete 14 system review of symptoms, the patient complains only of the following symptoms, and all other reviewed systems are negative.  Bowel incontinence  ALLERGIES: Allergies  Allergen Reactions  . Sulfa Antibiotics Nausea And Vomiting    HOME MEDICATIONS: Outpatient Medications Prior to Visit  Medication Sig Dispense Refill  . acetaminophen (TYLENOL) 500 MG tablet Take 2 tablets (1,000 mg total) by mouth every 6 (six) hours  as needed. 30 tablet 0  . acetaminophen-codeine (TYLENOL #3) 300-30 MG tablet Take 1 tablet by mouth every 6 (six) hours as needed.    Marland Kitchen atorvastatin (LIPITOR) 20 MG tablet TAKE ONE TABLET BY MOUTH DAILY    . Cholecalciferol 25 MCG (1000 UT) tablet Take 1,000 Units by mouth daily.    . clonazePAM (KLONOPIN) 0.5 MG tablet Take  1 tablet (0.5 mg total) by mouth at bedtime as needed (for insomnia). 90 tablet 1  . DULoxetine (CYMBALTA) 60 MG capsule Take 1 capsule (60 mg total) by mouth daily. 30 capsule 11  . Erenumab-aooe (AIMOVIG) 70 MG/ML SOAJ Inject 70 mg into the skin every 30 (thirty) days. 1 pen 11  . MYRBETRIQ 50 MG TB24 tablet Take 50 mg by mouth daily.    Marland Kitchen ocrelizumab (OCREVUS) 300 MG/10ML injection Inject into the vein.    Marland Kitchen ondansetron (ZOFRAN ODT) 4 MG disintegrating tablet Take 1 tablet (4 mg total) by mouth every 8 (eight) hours as needed. #20 per month. 60 tablet 1  . penicillin v potassium (VEETID) 500 MG tablet Take 1 tablet (500 mg total) by mouth 4 (four) times daily for 7 days. 28 tablet 0  . SUMAtriptan (IMITREX) 100 MG tablet Take 1 tablet (100 mg total) by mouth once as needed for up to 1 dose for migraine. May repeat in 2 hours if headache persists or recurs. 12 tablet 11  . UNABLE TO FIND Take 1 tablet by mouth 3 (three) times daily. Med Name: Johnney Ou    . UNABLE TO FIND Take 1 tablet by mouth 3 (three) times daily between meals. Med Name: Curalin     Facility-Administered Medications Prior to Visit  Medication Dose Route Frequency Provider Last Rate Last Admin  . gadopentetate dimeglumine (MAGNEVIST) injection 20 mL  20 mL Intravenous Once PRN Levert Feinstein, MD        PAST MEDICAL HISTORY: Past Medical History:  Diagnosis Date  . Ankle fracture, right   . History of hysterectomy   . Knee joint dislocation    bilateral  . Migraines   . Multiple sclerosis (HCC)     PAST SURGICAL HISTORY: Past Surgical History:  Procedure Laterality Date  . ABDOMINAL HYSTERECTOMY    . CESAREAN SECTION    . MYOMECTOMY      FAMILY HISTORY: Family History  Problem Relation Age of Onset  . Diabetes Mother   . Osteoarthritis Other   . Heart disease Other   . Rheumatic fever Other   . Hypertension Other     SOCIAL HISTORY: Social History   Socioeconomic History  . Marital status: Married     Spouse name: [name]  . Number of children: 1  . Years of education: college  . Highest education level: Not on file  Occupational History    Employer: [employer]    Comment: Does not work  Tobacco Use  . Smoking status: Never Smoker  . Smokeless tobacco: Never Used  Substance and Sexual Activity  . Alcohol use: No  . Drug use: No  . Sexual activity: Not on file  Other Topics Concern  . Not on file  Social History Narrative   Patient is married. [name]).    Patient is unemployed.   Education- College education   Right handed.   Caffeine- None   Social Determinants of Health   Financial Resource Strain:   . Difficulty of Paying Living Expenses:   Food Insecurity:   . Worried About [employer] of  Food in the Last Year:   . Ran Out of Food in the Last Year:   Transportation Needs:   . Freight forwarder (Medical):   Marland Kitchen Lack of Transportation (Non-Medical):   Physical Activity:   . Days of Exercise per Week:   . Minutes of Exercise per Session:   Stress:   . Feeling of Stress :   Social Connections:   . Frequency of Communication with Friends and Family:   . Frequency of Social Gatherings with Friends and Family:   . Attends Religious Services:   . Active Member of Clubs or Organizations:   . Attends Banker Meetings:   Marland Kitchen Marital Status:   Intimate Partner Violence:   . Fear of Current or Ex-Partner:   . Emotionally Abused:   Marland Kitchen Physically Abused:   . Sexually Abused:    PHYSICAL EXAM  Vitals:   10/14/19 0912  BP: 116/80  Pulse: 88  Weight: 176 lb (79.8 kg)  Height: 5\' 6"  (1.676 m)   Body mass index is 28.41 kg/m.  Generalized: Well developed, in no acute distress   Neurological examination  Mentation: Alert oriented to time, place, history taking. Follows all commands speech and language fluent Cranial nerve II-XII: Pupils were equal round reactive to light. Extraocular movements were full, visual field were full on confrontational  test. Facial sensation and strength were normal.  Head turning and shoulder shrug  were normal and symmetric. Motor: 4/5 weakness right hip flexion, 3/5 weakness left hip flexion Sensory: Sensory testing is intact to soft touch on all 4 extremities. No evidence of extinction is noted.  Coordination: Cerebellar testing reveals good finger-nose-finger and heel-to-shin bilaterally, slight difficulty performing with the left lower extremity Gait and station: Gait is wide-based, unsteady, tends to drag left leg Reflexes: Deep tendon reflexes are symmetric but increased throughout  DIAGNOSTIC DATA (LABS, IMAGING, TESTING) - I reviewed patient records, labs, notes, testing and imaging myself where available.  Lab Results  Component Value Date   WBC 6.8 09/01/2019   HGB 12.8 09/01/2019   HCT 41.1 09/01/2019   MCV 85 09/01/2019   PLT 270 09/01/2019      Component Value Date/Time   NA 142 09/01/2019 1542   K 4.4 09/01/2019 1542   CL 103 09/01/2019 1542   CO2 26 09/01/2019 1542   GLUCOSE 96 09/01/2019 1542   BUN 11 09/01/2019 1542   CREATININE 0.85 09/01/2019 1542   CALCIUM 9.6 09/01/2019 1542   PROT 7.2 09/01/2019 1542   ALBUMIN 4.4 09/01/2019 1542   AST 20 09/01/2019 1542   ALT 16 09/01/2019 1542   ALKPHOS 59 09/01/2019 1542   BILITOT <0.2 09/01/2019 1542   GFRNONAA 80 09/01/2019 1542   GFRAA 92 09/01/2019 1542   No results found for: CHOL, HDL, LDLCALC, LDLDIRECT, TRIG, CHOLHDL No results found for: 09/03/2019 Lab Results  Component Value Date   VITAMINB12 314 08/23/2013   Lab Results  Component Value Date   TSH 1.130 09/29/2017      ASSESSMENT AND PLAN 49 y.o. year old female  has a past medical history of Ankle fracture, right, History of hysterectomy, Knee joint dislocation, Migraines, and Multiple sclerosis (HCC). here with:  1.  Relapsing remitting multiple sclerosis, new report of bowel incontinence -Most recent MRI of the brain in October 2020 was overall stable  compared to prior, she does have multiple lesions in the brainstem, spinal cord, cerebellum, and cerebral hemispheres.  MRI of cervical spine in November 2019 showed  plaques at C2, C3, C5-6, C7 levels, no change compared to prior in 2016. -Remains on Ocrevus since October 2020, was previously on Tysabri Since January 2017 -Given new report of bowel incontinence for the last 3 weeks, will order repeat MRI of cervical spine with and without contrast to check for any progression of MS process (creatinine was normal 0.85, May 2021) -Encouraged to increase fiber intake, drink plenty of water, consider scheduled toileting -Otherwise, MS condition and symptoms overall stable -Keep next follow-up appointment in November, call for new or worsening symptoms  I spent 30 minutes of face-to-face and non-face-to-face time with patient.  This included previsit chart review, lab review, study review, order entry, electronic health record documentation, patient education.  Margie Ege, AGNP-C, DNP 10/14/2019, 9:47 AM [facility] [address], [address] [place], [place] [address] [phone]

## 2019-10-14 NOTE — Patient Instructions (Addendum)
I will order MRI of the cervical spine Increase fiber intake  Drink plenty of water, consider scheduled bathroom routine See you at your next appointment

## 2019-10-19 ENCOUNTER — Telehealth: Payer: Self-pay | Admitting: Neurology

## 2019-10-19 NOTE — Telephone Encounter (Signed)
[employer] Auth: [id number] (exp. 10/18/19 to 04/14/20 or sent to GI. They will reach out to the patient to schedule.

## 2019-10-20 ENCOUNTER — Ambulatory Visit
Admission: RE | Admit: 2019-10-20 | Discharge: 2019-10-20 | Disposition: A | Discharge status: Home / Self Care | Payer: BC Managed Care – PPO | Source: Ambulatory Visit | Attending: Neurology | Admitting: Neurology

## 2019-10-20 ENCOUNTER — Other Ambulatory Visit: Payer: Self-pay

## 2019-10-20 DIAGNOSIS — G35 Multiple sclerosis: Secondary | ICD-10-CM | POA: Diagnosis not present

## 2019-10-20 MED ORDER — GADOBENATE DIMEGLUMINE 529 MG/ML IV SOLN
15.0000 mL | Freq: Once | INTRAVENOUS | Status: AC | PRN
  Administered 2019-10-20: 15 mL via INTRAVENOUS

## 2019-10-27 ENCOUNTER — Other Ambulatory Visit: Payer: Self-pay | Admitting: *Deleted

## 2019-10-27 MED ORDER — ONDANSETRON 4 MG PO TBDP: 4.0000 mg | ORAL_TABLET | Freq: Three times a day (TID) | ORAL | 1 refills | Status: DC | PRN

## 2019-11-02 ENCOUNTER — Telehealth: Payer: Self-pay | Admitting: Neurology

## 2019-11-02 NOTE — Telephone Encounter (Signed)
Crystal@ [email or web address] [facility] SERVICE) [facility] PRIME  Has called for a refill for pt's  ondansetron (ZOFRAN ODT) 4 MG disintegrating tablet  90 day supply for 1 year

## 2019-11-10 ENCOUNTER — Other Ambulatory Visit: Payer: Self-pay | Admitting: Family Medicine

## 2019-11-10 DIAGNOSIS — Z1231 Encounter for screening mammogram for malignant neoplasm of breast: Secondary | ICD-10-CM

## 2019-11-23 ENCOUNTER — Telehealth: Payer: Self-pay | Admitting: Neurology

## 2019-11-23 NOTE — Telephone Encounter (Signed)
Received a PA renewal for Aimovig. PA was started on [email or web address]. Key is [id number]. Per [email or web address], determination will take up to 72 hours. Will check back later for determination.

## 2019-11-29 NOTE — Telephone Encounter (Signed)
Received determination from [email or web address]. PA has been approved from 11/23/19 to 11/21/20. Will fax a copy of the determination to pharmacy as I receive determination fax from [facility].

## 2020-01-17 ENCOUNTER — Encounter: Payer: Self-pay | Admitting: Neurology

## 2020-02-15 NOTE — Progress Notes (Signed)
I have reviewed and agreed above plan. 

## 2020-03-02 ENCOUNTER — Other Ambulatory Visit: Payer: BC Managed Care – PPO

## 2020-03-02 DIAGNOSIS — Z20822 Contact with and (suspected) exposure to covid-19: Secondary | ICD-10-CM

## 2020-03-03 LAB — SARS-COV-2, NAA 2 DAY TAT

## 2020-03-03 LAB — NOVEL CORONAVIRUS, NAA: SARS-CoV-2, NAA: NOT DETECTED

## 2020-03-07 ENCOUNTER — Ambulatory Visit (INDEPENDENT_AMBULATORY_CARE_PROVIDER_SITE_OTHER): Payer: BC Managed Care – PPO | Admitting: Neurology

## 2020-03-07 ENCOUNTER — Telehealth: Payer: Self-pay | Admitting: Neurology

## 2020-03-07 ENCOUNTER — Encounter: Payer: Self-pay | Admitting: Neurology

## 2020-03-07 VITALS — BP 113/74 | HR 70 | Ht 66.0 in | Wt 181.6 lb

## 2020-03-07 DIAGNOSIS — G35 Multiple sclerosis: Secondary | ICD-10-CM | POA: Diagnosis not present

## 2020-03-07 DIAGNOSIS — G43009 Migraine without aura, not intractable, without status migrainosus: Secondary | ICD-10-CM

## 2020-03-07 MED ORDER — DULOXETINE HCL 60 MG PO CPEP: 60.0000 mg | ORAL_CAPSULE | Freq: Every day | ORAL | 11 refills | Status: DC

## 2020-03-07 MED ORDER — ONDANSETRON 4 MG PO TBDP: 4.0000 mg | ORAL_TABLET | Freq: Three times a day (TID) | ORAL | 1 refills | Status: DC | PRN

## 2020-03-07 MED ORDER — SUMATRIPTAN SUCCINATE 100 MG PO TABS: 100.0000 mg | ORAL_TABLET | Freq: Once | ORAL | 11 refills | Status: DC | PRN

## 2020-03-07 NOTE — Telephone Encounter (Signed)
[employer] Berkley Harvey: [id number] (exp. 03/07/20 to 09/02/20) order sent to GI. They will reach out to the patient to schedule.

## 2020-03-07 NOTE — Progress Notes (Signed)
PATIENT: Dana Duke DOB: 1971-03-24  REASON FOR VISIT: follow up HISTORY FROM: patient  HISTORY OF PRESENT ILLNESS: Today 03/07/20  HISTORY  (initial visit was in Nov 2014) Dana Duke is a 49 years old right-handed African American female, referred by Dr. Suann Duke, Dana Duke, she has relapsing remitting multiple sclerosis  Since 2011 she noticed gradual onset slow worsening gait difficulty, she tends to drag her foot across the floor, stiff, has difficulty going upstairs, because of knee pain, she suffered a history of right ankle fracture in 2010, seems to dragging her right foot across the floor more, also complains urinary urgency, no bowel incontinence  MRI at [facility] in May 7th 2015, without contrast  MRI cervical spine (without) demonstrating at least 4 spinal cord T2 hyperintensities at cervico-medullary junction, C2, C3-4 and C6.  MRI brain (without) demonstrating multiple (> 20) round and ovoid, periventricular, callosal and peri-callosal, subcortical, juxtacortical, pontine and cerebellar T2 hyperintensities. Some of these are perpendicular to the lateral ventricles on axial and sagittal views. Some of these are hypointense on T1 views. Findings suspicious for demyelinating disease  CSF showed 5 oligoclonal banding, elevated IgG index  Extensive laboratory evaluations showed normal or negative CBC, CMP, Lyme titer, ANA, NMO, TSH, B12, folic acid, RPR. Positive varicellar zoster antibody  The visual evoked response test above was within normal limits on the left, with prolongation of the P100 latency on the right.   JC virus antibody was 0.15 negative in 10/18/2013  MRI of brain, and cervical spine with contrast, showed no contrast enhancement, there was no lesions noticed in MRI thoracic spine,  She startedPlegridy since July 2015, began full dose injection since Sept 15 2015. Complains of injection site reactio, lasting 2-4 weeks, she only has mild  flu like illness   She has fallen few times, more difficulty walking, when she fell few weeks ago, Sep 15th 2015, she has difficulty getting up, could not move from waist down for 2-3 hours, also noticed slurring of her speech. She has mild fever then  She called Dr. Anne Duke, had a repeat MRI of the brain Oct 2015, we have reviewed MRI together, continued evidence of periventricular white matter disease consistent with her diagnosis of multiple sclerosis, there was no change compared to previous image in may 2015  She was started on prednisone tapering since October 16,2015, tolerating the medication well, seems to have mild improvement in her gait difficulty  She also complains of frequent headaches, consistent with migraines,   UPDATE Feb 2nd 2016:  Last visit Jan 19th 2016, this is [name] 9-21 visit, she continued to have intermittent headaches, Maxalt 5 mg works most of the time, but sometimes with severe prolonged headaches, repeat dose of Maxalt fail to improve her symptoms, sleep always helps  UPDATE March 3rd 2016: She complains of worsening headaches almost daily, moderate, tried maxalt.without significant improvement, she is taking baclofen ER , nortriptyline  qhs. Also with bilateral knee and hip pain, left worse than right, bilateral feet numbness, coldness sensation, which are chronic.  UPDATE May 25th 2016: She is overall doing very well, no significant bilateral lower extremity spasticity over the past few weeks, baseline urinary incontinence, gradually getting worse, accident almost daily basis, she was recently evaluated by her gynecologist, planning on to see a urologist soon   UPDATE Mar 28 2015: She is with her husband daughter at today's clinical visit, we have reviewed previous MS history, she was started on Plegridy in July 2015, had a  flareup in Oct 2015, was treated with steroid, over the past 18 months, she had slow worsening gait difficulty  We  have reviewed MRI of the brain with and without contrast in November 2016: abnormal MRI of the brain with and without contrast showing T2/FLAIR hyperintense foci at the cervicomedullary junction and within the pons, midbrain and cerebellum in the posterior fossa. Additionally, there are many T2/FLAIR hyperintense foci in the cerebral hemispheres, predominantly in the periventricular white matter. The foci are consistent with chronic demyelinating plaques from multiple sclerosis. None of the foci appears to be acute and there are no enhancing lesions.   abnormal MRI of the cervical spine showing T2 hyperintense foci within the spinal cord at the cervicomedullary junction, C2, C3, C5C6 and C6 as detailed above. Additional foci are noted within the pons. None of the foci appears to be acute. No significant degenerative changes are noted. The lesions are consistent with chronic demyelinating plaques as would be seen in multiple sclerosis. The findings are similar to the MRI report from 08/19/2013.  She still has significant better urgency, no bowel incontinence, taking baclofen ER for bilateral lower extremity spasticity,  Migraine headaches: Overall under good control she is taking nortriptyline 20 mg every night, taking Topamax as needed instead of regular 100 mg twice a day Imitrex as needed was helpful  UPDATE June 29 2015: She started [place] infusion since January 2017, she tolerated well, she has much less frequent headaches, Maxalt continued to help, will taper off Topamax, leave her on nortriptyline 20 mg every night, her bilateral lower extremity spasticity has much improved as well, on lower dose of baclofen ER 30 mg daily,  UPDATE August 16th 2017: She complains of increased bilateral lower extremity now taking baclofen 10 mg 3 times a day, also nortriptyline 20 mg every night as migraine prevention, she barely has any headaches now, tolerating Tysarbri infusion very well,  continue have baseline gait abnormality, dragging left leg more, also complains of chronic insomnia,  UPDATE Dec 19th 2017: She complains of left hip and left knee pain, continue mild gait difficulty, tolerating Tysarbri infusion well, no worsening gait abnormality  We have reviewed MRI of the brain without contrast in November 2017:Abnormal MRI scan of the brain with and without contrast showing multiple periventricular, periatrial, subcortical and brainstem white matter hyperintensities consistent with chronic demyelinating disease. No enhancing lesions are noted. Overall no significant change compared with previous MRI scan dated 03/01/2015  UPDATE Dec 6th 2018: She is overall doing very well, continue complaints of fatigue, gait abnormality, left arm and leg deep achy pain, she rarely gets migraine headaches now, take nortriptyline 20 mg at bedtime, tolerating Tysabri IV infusion every month,  I have personally reviewed MRI of the brain in 2018 comparison to 2017, there is no significant change, no contrast enhancement.  UPDATE September 29 2017: She complains of worsening gait abnormality, reported 50% worse compared to 6 months ago, also has worsening urinary urgency, sometimes bladder incontinence, no significant weakness in her arms, also complains of significant left knee pain, was seen by orthopedic surgeon, intra-articular joint injection provided no significant improvement  UPDATE Jan 29 2018: Patient came in earlier than expected for gait abnormality, failed to respond well to Ampyra,  I reviewed the laboratory evaluation in June 2019, normal negative TSH, CMP, anti-Tysabri antibody, negative JC virus titer, CBC showed no significant abnormality.  She hoped to be evaluated for electronic motorized wheelchair, she ambulate without assistant most of the time, but does  complains of lack of stamina, tendency to fall,  UPDATE May 25 2018: She complains of worsening gait  abnormality, dragging her left leg more across the floor  Personally reviewed MRI cervical spine 01/02/2018, demyelinating plaque C2, 3, C5-6 and 7 level, no change compared to MRI in 2016  MRI of the brain with without contrast few small round periventricular subcortical, pontine, and cerebellar demyelinating plaque, no acute findings no change compared to MRI in 2018  Laboratory evaluation showed negative anti-Tysarbri antibody, JC virus antibody pending in February 2020  UPDATE Feb 25 2019: She was able to tolerate ocrelizumab infusion, no significant side effect noted, she continue complains slow worsening gait abnormality. Laboratory evaluation February 03, 2019 showed normal CBC, normal IgG, IgA, IgM, definite B-cell (CD19 plus, CD20 plus) is not identified  I personally reviewed MRI of the brain without contrast February 12, 2019, multiple T2/flair hyperintensity foci in the brainstem, spinal cord, cerebellum, cerebral hemisphere, in a pattern, and configuration, consistent with chronic demyelinating plaque associated with multiple sclerosis. There was no change compared to previous MRI in November 2019. No contrast-enhancement  UPDATE Jan 7th 2021: She came to clinic earlier than expected for worsening migraine headaches, she had 7 days of migraine around Christmas time, had recurrent migraine again over the past 4 days, failed multiple home dose of Imitrex, today she still complains 6 out of 10 right temporal area pounding headache, worsening with movement, light noise sensitivity, with mild nausea  Her headache failed to improve with 50 mgUbrevylsample, but much improved after she was given IV Depacon,,along with 10 mg of Compazine Toradol 30 mg  Update May 19, 2021SS:She remains on Ocrevus,last infusion was in April. MS is overall stable. She continues to have the chronic burning pain in her left knee.No new or worsening MS symptoms. For migraine, continues to report  for headaches a week, Imitrex is somewhat helpful. I do not think she started the magnesium or riboflavin. Feels her migraines are worsening. Using a cane. She is prescribed clonazepam, Cymbalta, Zofran, and Imitrex. She is unaccompanied today, upbeat verypleasant.  Update October 14, 2019 SS: Appointment today for new problem, reporting bowel incontinence.  Was in the ER recently for dental pain, had tooth extraction, is doing better  She is on Ocrevus since October 2020, last infusion was in April 2021.  Most recent MRI of the brain in October 2020 was overall stable.  She has MS lesions involving the brainstem, spinal cord, cerebellum, and cerebral hemisphere, from MRI of cervical spine in November 2019.  Indicates has noticed bowel incontinence for the last 3 weeks, will sit down to urinate and notice small BM in toilet, or pull her adult brief down, notice small ball of stool.  Has no sensation of this.  Continues with urinary incontinence.  No recent illness, fever, chills.  No UTI sx. and is overall stable, denies any new numbness, weakness, sensory alteration, changes in the walking.  Here today with husband.   Update March 07, 2020 SS: Here today for routine follow-up, had recent Ocrevus infusion about 2 weeks ago, afterwards, had a runny nose, was tested for Covid, negative.  Has continued urinary and bowel incontinence, saw specialist at [facility], Dr. Sherre Scarlet, recommended sacral neuromodulation, she is considering this.  Has continued achy pain to the left leg, remains on Cymbalta 60 mg daily.  Uses a cane or walker, has had a few falls, if she leans to the left, may go over. PCP noted slight swelling to lower  extremities, is going to get compression stockings.  Headaches remain well controlled, on average, 1 week, on Aimovig, will take Excedrin Migraine, Zofran for headache.  MRI cervical spine in July 2021 showed multiple spinal cord MS plaque, but overall stable.  Here today alone,  is upbeat, good spirits.   IMPRESSION: This MRI of the cervical spine with and without contrast shows the following: 1.   Multiple T2 hyperintense foci in the spinal cord and brain as detailed above.  Discrete foci are located adjacent to C1-C2, C2, C3, C5-C6, C6 and C7.  None of the foci enhance with contrast.  Compared to the MRI dated 02/21/2018, there do not appear to be any new lesions. 2.   Minimal disc degenerative changes C3-C4 and C4-C5 that do not lead to nerve root compression.  REVIEW OF SYSTEMS: Out of a complete 14 system review of symptoms, the patient complains only of the following symptoms, and all other reviewed systems are negative.  Walking difficulty, left leg pain, headache  ALLERGIES: Allergies  Allergen Reactions   Sulfa Antibiotics Nausea And Vomiting    HOME MEDICATIONS: Outpatient Medications Prior to Visit  Medication Sig Dispense Refill   acetaminophen (TYLENOL) 500 MG tablet Take 2 tablets (1,000 mg total) by mouth every 6 (six) hours as needed. 30 tablet 0   acetaminophen-codeine (TYLENOL #3) 300-30 MG tablet Take 1 tablet by mouth every 6 (six) hours as needed.     atorvastatin (LIPITOR) 20 MG tablet TAKE ONE TABLET BY MOUTH DAILY     Cholecalciferol 25 MCG (1000 UT) tablet Take 1,000 Units by mouth daily.     clonazePAM (KLONOPIN) 0.5 MG tablet Take 1 tablet (0.5 mg total) by mouth at bedtime as needed (for insomnia). 90 tablet 1   Erenumab-aooe (AIMOVIG) 70 MG/ML SOAJ Inject 70 mg into the skin every 30 (thirty) days. 1 pen 11   MYRBETRIQ 50 MG TB24 tablet Take 50 mg by mouth daily.     ocrelizumab (OCREVUS) 300 MG/10ML injection Inject into the vein.     DULoxetine (CYMBALTA) 60 MG capsule Take 1 capsule (60 mg total) by mouth daily. 30 capsule 11   ondansetron (ZOFRAN ODT) 4 MG disintegrating tablet Take 1 tablet (4 mg total) by mouth every 8 (eight) hours as needed. #20 per month. 60 tablet 1   SUMAtriptan (IMITREX) 100 MG tablet Take 1  tablet (100 mg total) by mouth once as needed for up to 1 dose for migraine. May repeat in 2 hours if headache persists or recurs. 12 tablet 11   UNABLE TO FIND Take 1 tablet by mouth 3 (three) times daily. Med Name: Glysen     UNABLE TO FIND Take 1 tablet by mouth 3 (three) times daily between meals. Med Name: Curalin     Facility-Administered Medications Prior to Visit  Medication Dose Route Frequency Provider Last Rate Last Admin   gadopentetate dimeglumine (MAGNEVIST) injection 20 mL  20 mL Intravenous Once PRN Levert Feinstein, MD        PAST MEDICAL HISTORY: Past Medical History:  Diagnosis Date   Ankle fracture, right    History of hysterectomy    Knee joint dislocation    bilateral   Migraines    Multiple sclerosis (HCC)     PAST SURGICAL HISTORY: Past Surgical History:  Procedure Laterality Date   ABDOMINAL HYSTERECTOMY     CESAREAN SECTION     MYOMECTOMY      FAMILY HISTORY: Family History  Problem Relation Age of Onset  Diabetes Mother    Osteoarthritis Other    Heart disease Other    Rheumatic fever Other    Hypertension Other     SOCIAL HISTORY: Social History   Socioeconomic History   Marital status: Married    Spouse name: [name]   Number of children: 1   Years of education: college   Highest education level: Not on file  Occupational History    Employer: [employer]    Comment: Does not work  Tobacco Use   Smoking status: Never Smoker   Smokeless tobacco: Never Used  Substance and Sexual Activity   Alcohol use: No   Drug use: No   Sexual activity: Not on file  Other Topics Concern   Not on file  Social History Narrative   Patient is married. [name]).    Patient is unemployed.   Education- College education   Right handed.   Caffeine- None   Social Determinants of Health   Financial Resource Strain:    Difficulty of Paying Living Expenses: Not on file  Food Insecurity:    Worried About Programme researcher, broadcasting/film/video  in the Last Year: Not on file   [employer] of Food in the Last Year: Not on file  Transportation Needs:    Lack of Transportation (Medical): Not on file   Lack of Transportation (Non-Medical): Not on file  Physical Activity:    Days of Exercise per Week: Not on file   Minutes of Exercise per Session: Not on file  Stress:    Feeling of Stress : Not on file  Social Connections:    Frequency of Communication with Friends and Family: Not on file   Frequency of Social Gatherings with Friends and Family: Not on file   Attends Religious Services: Not on file   Active Member of Clubs or Organizations: Not on file   Attends Banker Meetings: Not on file   Marital Status: Not on file  Intimate Partner Violence:    Fear of Current or Ex-Partner: Not on file   Emotionally Abused: Not on file   Physically Abused: Not on file   Sexually Abused: Not on file  PHYSICAL EXAM  Vitals:   03/07/20 1235  BP: 113/74  Pulse: 70  Weight: 181 lb 9.6 oz (82.4 kg)  Height: 5\' 6"  (1.676 m)   Body mass index is 29.31 kg/m.  Generalized: Well developed, in no acute distress   Neurological examination  Mentation: Alert oriented to time, place, history taking. Follows all commands speech and language fluent Cranial nerve II-XII: Pupils were equal round reactive to light. Extraocular movements were full, visual field were full on confrontational test. Facial sensation and strength were normal. Head turning and shoulder shrug  were normal and symmetric. Motor: Mild left upper and lower extremity weakness 4/5, mild spasticity left lower extremity Sensory: Sensory testing is intact to soft touch on all 4 extremities. No evidence of extinction is noted.  Coordination: Cerebellar testing reveals good finger-nose-finger and heel-to-shin bilaterally.  Gait and station: has to rock to stand, limp on the left, wide-based, unsteady, braces the wall in the hallway Reflexes: Deep tendon  reflexes are symmetric but increased  DIAGNOSTIC DATA (LABS, IMAGING, TESTING) - I reviewed patient records, labs, notes, testing and imaging myself where available.  Lab Results  Component Value Date   WBC 6.8 09/01/2019   HGB 12.8 09/01/2019   HCT 41.1 09/01/2019   MCV 85 09/01/2019   PLT 270 09/01/2019  Component Value Date/Time   NA 142 09/01/2019 1542   K 4.4 09/01/2019 1542   CL 103 09/01/2019 1542   CO2 26 09/01/2019 1542   GLUCOSE 96 09/01/2019 1542   BUN 11 09/01/2019 1542   CREATININE 0.85 09/01/2019 1542   CALCIUM 9.6 09/01/2019 1542   PROT 7.2 09/01/2019 1542   ALBUMIN 4.4 09/01/2019 1542   AST 20 09/01/2019 1542   ALT 16 09/01/2019 1542   ALKPHOS 59 09/01/2019 1542   BILITOT <0.2 09/01/2019 1542   GFRNONAA 80 09/01/2019 1542   GFRAA 92 09/01/2019 1542   No results found for: CHOL, HDL, LDLCALC, LDLDIRECT, TRIG, CHOLHDL No results found for: ASTM1D Lab Results  Component Value Date   VITAMINB12 314 08/23/2013   Lab Results  Component Value Date   TSH 1.130 09/29/2017    ASSESSMENT AND PLAN 49 y.o. year old female  has a past medical history of Ankle fracture, right, History of hysterectomy, Knee joint dislocation, Migraines, and Multiple sclerosis (HCC). here with:  1. Relapsing remitting multiple sclerosis -Is overall stable, last Ocrevus infusion 2 weeks ago -MRI cervical spine in July 2021 was overall stable, showed multiple spinal cord MS plaques, but no significant change, to explain report of bowel incontinence reports last office visit 10/14/19 -Seeing bowel/urinary incontinence specialist at [facility], considering sacral neuromodulation -Most recent MRI of the brain in October 2020 was overall stable compared to prior, she does have multiple lesions in the brainstem, spinal cord, cerebellum, and cerebral hemispheres-will repeat MRI of the brain today with and without contrast -Continue Ocrevus, since October 2020 -Check routine blood work, IgG,  IgA, IgM -Continue Cymbalta 60 mg daily for achy pain, mostly left knee  2. Chronic migraine headaches -Previously tried and failed nortriptyline, Topamax, Cymbalta -Continue Aimovig 70 mg monthly injection -Continue Imitrex as needed, may combine with Zofran, Aleve for acute treatment -Follow-up in 6 months or sooner if needed  I spent 30 minutes of face-to-face and non-face-to-face time with patient.  This included previsit chart review, lab review, study review, order entry, electronic health record documentation, patient education.  Margie Ege, AGNP-C, DNP 03/07/2020, 1:15 PM [facility] [address] [place], [place] [address] [phone]

## 2020-03-07 NOTE — Patient Instructions (Signed)
Great to see you today! Continue Ocrevus Check blood work today  Check MRI of the brain  Continue current medications See you back in 6 months

## 2020-03-08 LAB — COMPREHENSIVE METABOLIC PANEL
ALT: 12 IU/L (ref 0–32)
AST: 14 IU/L (ref 0–40)
Albumin/Globulin Ratio: 1.5 (ref 1.2–2.2)
Albumin: 4.5 g/dL (ref 3.8–4.9)
Alkaline Phosphatase: 77 IU/L (ref 44–121)
BUN/Creatinine Ratio: 20 (ref 9–23)
BUN: 17 mg/dL (ref 6–24)
Bilirubin Total: 0.2 mg/dL (ref 0.0–1.2)
CO2: 26 mmol/L (ref 20–29)
Calcium: 9.8 mg/dL (ref 8.7–10.2)
Chloride: 103 mmol/L (ref 96–106)
Creatinine, Ser: 0.83 mg/dL (ref 0.57–1.00)
GFR calc Af Amer: 94 mL/min/{1.73_m2} (ref >59)
GFR calc non Af Amer: 82 mL/min/{1.73_m2} (ref >59)
Globulin, Total: 3.1 g/dL (ref 1.5–4.5)
Glucose: 95 mg/dL (ref 65–99)
Potassium: 4.7 mmol/L (ref 3.5–5.2)
Sodium: 143 mmol/L (ref 134–144)
Total Protein: 7.6 g/dL (ref 6.0–8.5)

## 2020-03-08 LAB — CBC WITH DIFFERENTIAL/PLATELET
Basophils Absolute: 0 10*3/uL (ref 0.0–0.2)
Basos: 0 %
EOS (ABSOLUTE): 0.1 10*3/uL (ref 0.0–0.4)
Eos: 2 %
Hematocrit: 40.4 % (ref 34.0–46.6)
Hemoglobin: 13 g/dL (ref 11.1–15.9)
Immature Grans (Abs): 0 10*3/uL (ref 0.0–0.1)
Immature Granulocytes: 0 %
Lymphocytes Absolute: 1.7 10*3/uL (ref 0.7–3.1)
Lymphs: 23 %
MCH: 26.6 pg (ref 26.6–33.0)
MCHC: 32.2 g/dL (ref 31.5–35.7)
MCV: 83 fL (ref 79–97)
Monocytes Absolute: 0.5 10*3/uL (ref 0.1–0.9)
Monocytes: 7 %
Neutrophils Absolute: 4.9 10*3/uL (ref 1.4–7.0)
Neutrophils: 68 %
Platelets: 289 10*3/uL (ref 150–450)
RBC: 4.88 x10E6/uL (ref 3.77–5.28)
RDW: 13.7 % (ref 11.7–15.4)
WBC: 7.3 10*3/uL (ref 3.4–10.8)

## 2020-03-08 LAB — IGG, IGA, IGM
IgA/Immunoglobulin A, Serum: 198 mg/dL (ref 87–352)
IgG (Immunoglobin G), Serum: 1559 mg/dL (ref 586–1602)
IgM (Immunoglobulin M), Srm: 135 mg/dL (ref 26–217)

## 2020-03-11 ENCOUNTER — Other Ambulatory Visit: Payer: Self-pay

## 2020-03-11 ENCOUNTER — Ambulatory Visit
Admission: RE | Admit: 2020-03-11 | Discharge: 2020-03-11 | Disposition: A | Discharge status: Home / Self Care | Payer: BC Managed Care – PPO | Source: Ambulatory Visit | Attending: Neurology | Admitting: Neurology

## 2020-03-11 DIAGNOSIS — G35 Multiple sclerosis: Secondary | ICD-10-CM

## 2020-03-11 MED ORDER — GADOBENATE DIMEGLUMINE 529 MG/ML IV SOLN
17.0000 mL | Freq: Once | INTRAVENOUS | Status: AC | PRN
  Administered 2020-03-11: 17 mL via INTRAVENOUS

## 2020-03-13 ENCOUNTER — Ambulatory Visit: Payer: BC Managed Care – PPO

## 2020-03-17 ENCOUNTER — Ambulatory Visit
Admission: RE | Admit: 2020-03-17 | Discharge: 2020-03-17 | Disposition: A | Discharge status: Home / Self Care | Payer: BC Managed Care – PPO | Source: Ambulatory Visit | Attending: Family Medicine | Admitting: Family Medicine

## 2020-03-17 ENCOUNTER — Other Ambulatory Visit: Payer: Self-pay

## 2020-03-17 DIAGNOSIS — Z1231 Encounter for screening mammogram for malignant neoplasm of breast: Secondary | ICD-10-CM

## 2020-03-19 ENCOUNTER — Other Ambulatory Visit: Payer: BC Managed Care – PPO

## 2020-03-20 ENCOUNTER — Encounter: Payer: Self-pay | Admitting: Neurology

## 2020-03-21 ENCOUNTER — Other Ambulatory Visit: Payer: Self-pay | Admitting: Family Medicine

## 2020-03-21 DIAGNOSIS — N6310 Unspecified lump in the right breast, unspecified quadrant: Secondary | ICD-10-CM

## 2020-03-27 ENCOUNTER — Encounter: Payer: Self-pay | Admitting: Neurology

## 2020-03-28 ENCOUNTER — Encounter: Payer: Self-pay | Admitting: *Deleted

## 2020-03-31 ENCOUNTER — Other Ambulatory Visit: Payer: Self-pay | Admitting: Family Medicine

## 2020-03-31 ENCOUNTER — Ambulatory Visit
Admission: RE | Admit: 2020-03-31 | Discharge: 2020-03-31 | Disposition: A | Discharge status: Home / Self Care | Payer: BC Managed Care – PPO | Source: Ambulatory Visit | Attending: Family Medicine | Admitting: Family Medicine

## 2020-03-31 ENCOUNTER — Other Ambulatory Visit: Payer: Self-pay

## 2020-03-31 DIAGNOSIS — N6310 Unspecified lump in the right breast, unspecified quadrant: Secondary | ICD-10-CM

## 2020-04-03 ENCOUNTER — Other Ambulatory Visit: Payer: BC Managed Care – PPO

## 2020-04-04 ENCOUNTER — Other Ambulatory Visit: Payer: Self-pay

## 2020-04-04 ENCOUNTER — Other Ambulatory Visit: Payer: BC Managed Care – PPO

## 2020-04-04 DIAGNOSIS — Z20822 Contact with and (suspected) exposure to covid-19: Secondary | ICD-10-CM

## 2020-04-06 LAB — SPECIMEN STATUS REPORT

## 2020-04-06 LAB — SARS-COV-2, NAA 2 DAY TAT

## 2020-04-06 LAB — NOVEL CORONAVIRUS, NAA: SARS-CoV-2, NAA: NOT DETECTED

## 2020-05-18 NOTE — Progress Notes (Signed)
I have reviewed and agreed above plan. 

## 2020-09-05 ENCOUNTER — Ambulatory Visit (INDEPENDENT_AMBULATORY_CARE_PROVIDER_SITE_OTHER): Payer: BC Managed Care – PPO | Admitting: Neurology

## 2020-09-05 ENCOUNTER — Encounter: Payer: Self-pay | Admitting: Neurology

## 2020-09-05 ENCOUNTER — Other Ambulatory Visit: Payer: Self-pay

## 2020-09-05 VITALS — BP 124/80 | HR 68 | Ht 66.0 in | Wt 195.0 lb

## 2020-09-05 DIAGNOSIS — G35 Multiple sclerosis: Secondary | ICD-10-CM

## 2020-09-05 DIAGNOSIS — G43009 Migraine without aura, not intractable, without status migrainosus: Secondary | ICD-10-CM | POA: Diagnosis not present

## 2020-09-05 MED ORDER — AIMOVIG 70 MG/ML ~~LOC~~ SOAJ: 70.0000 mg | SUBCUTANEOUS | 11 refills | Status: DC

## 2020-09-05 MED ORDER — DULOXETINE HCL 60 MG PO CPEP: 60.0000 mg | ORAL_CAPSULE | Freq: Every day | ORAL | 11 refills | Status: DC

## 2020-09-05 MED ORDER — SUMATRIPTAN SUCCINATE 100 MG PO TABS: 100.0000 mg | ORAL_TABLET | Freq: Once | ORAL | 11 refills | Status: DC | PRN

## 2020-09-05 NOTE — Progress Notes (Signed)
PATIENT: Dana Duke DOB: 18-Aug-1970  REASON FOR VISIT: follow up HISTORY FROM: patient  HISTORY OF PRESENT ILLNESS: Today 09/05/20  HISTORY  (initial visit was in Nov 2014) Dana Duke is a 50 years old right-handed African American female, referred by Dr. Suann Larry, Janalyn Rouse, she has relapsing remitting multiple sclerosis  Since 2011 she noticed gradual onset slow worsening gait difficulty, she tends to drag her foot across the floor, stiff, has difficulty going upstairs, because of knee pain, she suffered a history of right ankle fracture in 2010, seems to dragging her right foot across the floor more, also complains urinary urgency, no bowel incontinence  MRI at [facility] in May 7th 2015, without contrast  MRI cervical spine (without) demonstrating at least 4 spinal cord T2 hyperintensities at cervico-medullary junction, C2, C3-4 and C6.  MRI brain (without) demonstrating multiple (> 20) round and ovoid, periventricular, callosal and peri-callosal, subcortical, juxtacortical, pontine and cerebellar T2 hyperintensities. Some of these are perpendicular to the lateral ventricles on axial and sagittal views. Some of these are hypointense on T1 views. Findings suspicious for demyelinating disease  CSF showed 5 oligoclonal banding, elevated IgG index  Extensive laboratory evaluations showed normal or negative CBC, CMP, Lyme titer, ANA, NMO, TSH, B12, folic acid, RPR. Positive varicellar zoster antibody  The visual evoked response test above was within normal limits on the left, with prolongation of the P100 latency on the right.   JC virus antibody was 0.15 negative in 10/18/2013  MRI of brain, and cervical spine with contrast, showed no contrast enhancement, there was no lesions noticed in MRI thoracic spine,  She startedPlegridy since July 2015, began full dose injection since Sept 15 2015. Complains of injection site reactio, lasting 2-4 weeks, she only has mild  flu like illness   She has fallen few times, more difficulty walking, when she fell few weeks ago, Sep 15th 2015, she has difficulty getting up, could not move from waist down for 2-3 hours, also noticed slurring of her speech. She has mild fever then  She called Dr. Anne Hahn, had a repeat MRI of the brain Oct 2015, we have reviewed MRI together, continued evidence of periventricular white matter disease consistent with her diagnosis of multiple sclerosis, there was no change compared to previous image in may 2015  She was started on prednisone tapering since October 16,2015, tolerating the medication well, seems to have mild improvement in her gait difficulty  She also complains of frequent headaches, consistent with migraines,   UPDATE Feb 2nd 2016:  Last visit Jan 19th 2016, this is [name] 9-21 visit, she continued to have intermittent headaches, Maxalt 5 mg works most of the time, but sometimes with severe prolonged headaches, repeat dose of Maxalt fail to improve her symptoms, sleep always helps  UPDATE March 3rd 2016: She complains of worsening headaches almost daily, moderate, tried maxalt.without significant improvement, she is taking baclofen ER 30mg , nortriptyline 20mg  qhs. Also with bilateral knee and hip pain, left worse than right, bilateral feet numbness, coldness sensation, which are chronic.  UPDATE May 25th 2016: She is overall doing very well, no significant bilateral lower extremity spasticity over the past few weeks, baseline urinary incontinence, gradually getting worse, accident almost daily basis, she was recently evaluated by her gynecologist, planning on to see a urologist soon   UPDATE Mar 28 2015: She is with her husband daughter at today's clinical visit, we have reviewed previous MS history, she was started on Plegridy in July 2015, had a  flareup in Oct 2015, was treated with steroid, over the past 18 months, she had slow worsening gait difficulty  We  have reviewed MRI of the brain with and without contrast in November 2016: abnormal MRI of the brain with and without contrast showing T2/FLAIR hyperintense foci at the cervicomedullary junction and within the pons, midbrain and cerebellum in the posterior fossa. Additionally, there are many T2/FLAIR hyperintense foci in the cerebral hemispheres, predominantly in the periventricular white matter. The foci are consistent with chronic demyelinating plaques from multiple sclerosis. None of the foci appears to be acute and there are no enhancing lesions.   abnormal MRI of the cervical spine showing T2 hyperintense foci within the spinal cord at the cervicomedullary junction, C2, C3, C5C6 and C6 as detailed above. Additional foci are noted within the pons. None of the foci appears to be acute. No significant degenerative changes are noted. The lesions are consistent with chronic demyelinating plaques as would be seen in multiple sclerosis. The findings are similar to the MRI report from 08/19/2013.  She still has significant better urgency, no bowel incontinence, taking baclofen ER for bilateral lower extremity spasticity,  Migraine headaches: Overall under good control she is taking nortriptyline 20 mg every night, taking Topamax as needed instead of regular 100 mg twice a day Imitrex as needed was helpful  UPDATE June 29 2015: She started [place] infusion since January 2017, she tolerated well, she has much less frequent headaches, Maxalt continued to help, will taper off Topamax, leave her on nortriptyline 20 mg every night, her bilateral lower extremity spasticity has much improved as well, on lower dose of baclofen ER 30 mg daily,  UPDATE August 16th 2017: She complains of increased bilateral lower extremity now taking baclofen 10 mg 3 times a day, also nortriptyline 20 mg every night as migraine prevention, she barely has any headaches now, tolerating Tysarbri infusion very well,  continue have baseline gait abnormality, dragging left leg more, also complains of chronic insomnia,  UPDATE Dec 19th 2017: She complains of left hip and left knee pain, continue mild gait difficulty, tolerating Tysarbri infusion well, no worsening gait abnormality  We have reviewed MRI of the brain without contrast in November 2017:Abnormal MRI scan of the brain with and without contrast showing multiple periventricular, periatrial, subcortical and brainstem white matter hyperintensities consistent with chronic demyelinating disease. No enhancing lesions are noted. Overall no significant change compared with previous MRI scan dated 03/01/2015  UPDATE Dec 6th 2018: She is overall doing very well, continue complaints of fatigue, gait abnormality, left arm and leg deep achy pain, she rarely gets migraine headaches now, take nortriptyline 20 mg at bedtime, tolerating Tysabri IV infusion every month,  I have personally reviewed MRI of the brain in 2018 comparison to 2017, there is no significant change, no contrast enhancement.  UPDATE September 29 2017: She complains of worsening gait abnormality, reported 50% worse compared to 6 months ago, also has worsening urinary urgency, sometimes bladder incontinence, no significant weakness in her arms, also complains of significant left knee pain, was seen by orthopedic surgeon, intra-articular joint injection provided no significant improvement  UPDATE Jan 29 2018: Patient came in earlier than expected for gait abnormality, failed to respond well to Ampyra,  I reviewed the laboratory evaluation in June 2019, normal negative TSH, CMP, anti-Tysabri antibody, negative JC virus titer, CBC showed no significant abnormality.  She hoped to be evaluated for electronic motorized wheelchair, she ambulate without assistant most of the time, but does  complains of lack of stamina, tendency to fall,  UPDATE May 25 2018: She complains of worsening gait  abnormality, dragging her left leg more across the floor  Personally reviewed MRI cervical spine 01/02/2018, demyelinating plaque C2, 3, C5-6 and 7 level, no change compared to MRI in 2016  MRI of the brain with without contrast few small round periventricular subcortical, pontine, and cerebellar demyelinating plaque, no acute findings no change compared to MRI in 2018  Laboratory evaluation showed negative anti-Tysarbri antibody, JC virus antibody pending in February 2020  UPDATE Feb 25 2019: She was able to tolerate ocrelizumab infusion, no significant side effect noted, she continue complains slow worsening gait abnormality. Laboratory evaluation February 03, 2019 showed normal CBC, normal IgG, IgA, IgM, definite B-cell (CD19 plus, CD20 plus) is not identified  I personally reviewed MRI of the brain without contrast February 12, 2019, multiple T2/flair hyperintensity foci in the brainstem, spinal cord, cerebellum, cerebral hemisphere, in a pattern, and configuration, consistent with chronic demyelinating plaque associated with multiple sclerosis. There was no change compared to previous MRI in November 2019. No contrast-enhancement  UPDATE Jan 7th 2021: She came to clinic earlier than expected for worsening migraine headaches, she had 7 days of migraine around Christmas time, had recurrent migraine again over the past 4 days, failed multiple home dose of Imitrex, today she still complains 6 out of 10 right temporal area pounding headache, worsening with movement, light noise sensitivity, with mild nausea  Her headache failed to improve with 50 mgUbrevylsample, but much improved after she was given IV Depacon,,along with 10 mg of Compazine Toradol 30 mg  Update May 19, 2021SS:She remains on Ocrevus,last infusion was in April. MS is overall stable. She continues to have the chronic burning pain in her left knee.No new or worsening MS symptoms. For migraine, continues to report  for headaches a week, Imitrex is somewhat helpful. I do not think she started the magnesium or riboflavin. Feels her migraines are worsening. Using a cane. She is prescribed clonazepam, Cymbalta, Zofran, and Imitrex. She is unaccompanied today, upbeat verypleasant.  Update October 14, 2019 SS: Appointment today for new problem, reporting bowel incontinence.  Was in the ER recently for dental pain, had tooth extraction, is doing better  She is on Ocrevus since October 2020, last infusion was in April 2021.  Most recent MRI of the brain in October 2020 was overall stable.  She has MS lesions involving the brainstem, spinal cord, cerebellum, and cerebral hemisphere, from MRI of cervical spine in November 2019.  Indicates has noticed bowel incontinence for the last 3 weeks, will sit down to urinate and notice small BM in toilet, or pull her adult brief down, notice small ball of stool.  Has no sensation of this.  Continues with urinary incontinence.  No recent illness, fever, chills.  No UTI sx. and is overall stable, denies any new numbness, weakness, sensory alteration, changes in the walking.  Here today with husband.   Update March 07, 2020 SS: Here today for routine follow-up, had recent Ocrevus infusion about 2 weeks ago, afterwards, had a runny nose, was tested for Covid, negative.  Has continued urinary and bowel incontinence, saw specialist at [facility], Dr. Sherre Scarlet, recommended sacral neuromodulation, she is considering this.  Has continued achy pain to the left leg, remains on Cymbalta 60 mg daily.  Uses a cane or walker, has had a few falls, if she leans to the left, may go over. PCP noted slight swelling to lower  extremities, is going to get compression stockings.  Headaches remain well controlled, on average, 1 week, on Aimovig, will take Excedrin Migraine, Zofran for headache.  MRI cervical spine in July 2021 showed multiple spinal cord MS plaque, but overall stable.  Here today alone,  is upbeat, good spirits.   IMPRESSION: This MRI of the cervical spine with and without contrast shows the following: 1.   Multiple T2 hyperintense foci in the spinal cord and brain as detailed above.  Discrete foci are located adjacent to C1-C2, C2, C3, C5-C6, C6 and C7.  None of the foci enhance with contrast.  Compared to the MRI dated 02/21/2018, there do not appear to be any new lesions. 2.   Minimal disc degenerative changes C3-C4 and C4-C5 that do not lead to nerve root compression.  Update Sep 05, 2020 SS: Here today unaccompanied, just had Ocrevus infusion yesterday, prior to infusion, felt balance was worsening.  Hopefully will perk up.  Mentions getting a knee brace, left knee will buckle, has been fitted before, brace cover the entire leg.  For the last 2 months, has not been able to afford Aimovig, has been having more headaches.  Still seeing incontinence specialist at [facility], has tried several different medications, on Colace, Amitiza now, also Myrbetriq for urinary.  Considering sacral neuromodulation once meets deductible.  Has a chronic pain to the left leg.  Takes Imitrex with good benefit for acute headache.  Having 30-year bowel renewal in June.   MRI of the brain with and without contrast November 2021 was overall stable, no change from October 2020, showed multiple supratentorial and infratentorial demyelinating plaques  REVIEW OF SYSTEMS: Out of a complete 14 system review of symptoms, the patient complains only of the following symptoms, and all other reviewed systems are negative.  See HPI  ALLERGIES: Allergies  Allergen Reactions  . Sulfa Antibiotics Nausea And Vomiting    HOME MEDICATIONS: Outpatient Medications Prior to Visit  Medication Sig Dispense Refill  . acetaminophen (TYLENOL) 500 MG tablet Take 2 tablets (1,000 mg total) by mouth every 6 (six) hours as needed. 30 tablet 0  . acetaminophen-codeine (TYLENOL #3) 300-30 MG tablet Take 1 tablet by mouth every 6  (six) hours as needed.    . Cholecalciferol 25 MCG (1000 UT) tablet Take 1,000 Units by mouth daily.    . clonazePAM (KLONOPIN) 0.5 MG tablet Take 1 tablet (0.5 mg total) by mouth at bedtime as needed (for insomnia). 90 tablet 1  . MYRBETRIQ 50 MG TB24 tablet Take 50 mg by mouth daily.    Marland Kitchen ocrelizumab (OCREVUS) 300 MG/10ML injection Inject into the vein.    Marland Kitchen ondansetron (ZOFRAN ODT) 4 MG disintegrating tablet Take 1 tablet (4 mg total) by mouth every 8 (eight) hours as needed. #20 per month. 60 tablet 1  . DULoxetine (CYMBALTA) 60 MG capsule Take 1 capsule (60 mg total) by mouth daily. 30 capsule 11  . Erenumab-aooe (AIMOVIG) 70 MG/ML SOAJ Inject 70 mg into the skin every 30 (thirty) days. 1 pen 11  . SUMAtriptan (IMITREX) 100 MG tablet Take 1 tablet (100 mg total) by mouth once as needed for up to 1 dose for migraine. May repeat in 2 hours if headache persists or recurs. 12 tablet 11  . atorvastatin (LIPITOR) 20 MG tablet TAKE ONE TABLET BY MOUTH DAILY     Facility-Administered Medications Prior to Visit  Medication Dose Route Frequency Provider Last Rate Last Admin  . gadopentetate dimeglumine (MAGNEVIST) injection 20 mL  20 mL Intravenous Once PRN Levert Feinstein, MD        PAST MEDICAL HISTORY: Past Medical History:  Diagnosis Date  . Ankle fracture, right   . History of hysterectomy   . Knee joint dislocation    bilateral  . Migraines   . Multiple sclerosis (HCC)     PAST SURGICAL HISTORY: Past Surgical History:  Procedure Laterality Date  . ABDOMINAL HYSTERECTOMY    . CESAREAN SECTION    . MYOMECTOMY      FAMILY HISTORY: Family History  Problem Relation Age of Onset  . Diabetes Mother   . Osteoarthritis Other   . Heart disease Other   . Rheumatic fever Other   . Hypertension Other     SOCIAL HISTORY: Social History   Socioeconomic History  . Marital status: Married    Spouse name: [name]  . Number of children: 1  . Years of education: college  . Highest education  level: Not on file  Occupational History    Employer: [employer]    Comment: Does not work  Tobacco Use  . Smoking status: Never Smoker  . Smokeless tobacco: Never Used  Substance and Sexual Activity  . Alcohol use: No  . Drug use: No  . Sexual activity: Not on file  Other Topics Concern  . Not on file  Social History Narrative   Patient is married. [name]).    Patient is unemployed.   Education- College education   Right handed.   Caffeine- None   Social Determinants of Health   Financial Resource Strain: Not on file  Food Insecurity: Not on file  Transportation Needs: Not on file  Physical Activity: Not on file  Stress: Not on file  Social Connections: Not on file  Intimate Partner Violence: Not on file  PHYSICAL EXAM  Vitals:   09/05/20 1235  BP: 124/80  Pulse: 68  Weight: 195 lb (88.5 kg)  Height: 5\' 6"  (1.676 m)   Body mass index is 31.47 kg/m.  Generalized: Well developed, in no acute distress, very pleasant  Neurological examination  Mentation: Alert oriented to time, place, history taking. Follows all commands speech and language fluent Cranial nerve II-XII: Pupils were equal round reactive to light. Extraocular movements were full, visual field were full on confrontational test. Facial sensation and strength were normal. Head turning and shoulder shrug  were normal and symmetric. Motor: Mild left upper and lower extremity weakness 4/5, mild spasticity left lower extremity Sensory: Sensory testing is intact to soft touch on all 4 extremities. No evidence of extinction is noted.  Coordination: Cerebellar testing reveals good finger-nose-finger and heel-to-shin bilaterally.  Gait and station: has to rock to stand, limp on the left, wide-based, left knee tends to hyperextend Reflexes: Deep tendon reflexes are symmetric but increased  DIAGNOSTIC DATA (LABS, IMAGING, TESTING) - I reviewed patient records, labs, notes, testing and imaging myself where  available.  Lab Results  Component Value Date   WBC 7.3 03/07/2020   HGB 13.0 03/07/2020   HCT 40.4 03/07/2020   MCV 83 03/07/2020   PLT 289 03/07/2020      Component Value Date/Time   NA 143 03/07/2020 1321   K 4.7 03/07/2020 1321   CL 103 03/07/2020 1321   CO2 26 03/07/2020 1321   GLUCOSE 95 03/07/2020 1321   BUN 17 03/07/2020 1321   CREATININE 0.83 03/07/2020 1321   CALCIUM 9.8 03/07/2020 1321   PROT 7.6 03/07/2020 1321   ALBUMIN 4.5 03/07/2020 1321  AST 14 03/07/2020 1321   ALT 12 03/07/2020 1321   ALKPHOS 77 03/07/2020 1321   BILITOT 0.2 03/07/2020 1321   GFRNONAA 82 03/07/2020 1321   GFRAA 94 03/07/2020 1321   No results found for: CHOL, HDL, LDLCALC, LDLDIRECT, TRIG, CHOLHDL No results found for: [id number] Lab Results  Component Value Date   VITAMINB12 314 08/23/2013   Lab Results  Component Value Date   TSH 1.130 09/29/2017    ASSESSMENT AND PLAN 50 y.o. year old female  has a past medical history of Ankle fracture, right, History of hysterectomy, Knee joint dislocation, Migraines, and Multiple sclerosis (HCC). here with:  1. Relapsing remitting multiple sclerosis  -Remains overall stable, had Ocrevus infusion yesterday, notices more balance issues when due next infusion  -MRI of the brain with and without contrast in November 2021 was overall stable from prior in October 2020, multiple supratentorial and infratentorial chronic demyelinating plaques -MRI cervical spine in July 2021 was overall stable, showed multiple spinal cord MS plaques, but no significant change -[facility], considering sacral neuromodulation, now on oral medications, helping -Continue Ocrevus, since October 2020 -Check routine blood work, IgG, IgA, IgM -Continue Cymbalta 60 mg daily for achy pain, mostly left knee, check online for knee support device, previously fitted but too bulky for her  2. Chronic migraine headaches  -Continue Aimovig  70 mg monthly injection, given 3 month samples today, couldn't afford last 2 months -Continue Imitrex as needed, may combine with Zofran, Aleve for acute treatment -Previously tried and failed nortriptyline, Topamax, Cymbalta -Follow-up in 6 months or sooner if needed with Dr.Yan   I spent 31 minutes of face-to-face and non-face-to-face time with patient.  This included previsit chart review, lab review, study review, order entry,discussing knee support device, medication/management.   Margie EgeSarah Jermone Geister, AGNP-C, DNP 09/05/2020, 1:11 PM [facility] [address], [address] [place], [place] [address] [phone]

## 2020-09-05 NOTE — Patient Instructions (Signed)
Look for knee support device online  Check labs today  Gave your samples of Aimovig  Continue other medications See you back in 6 months

## 2020-09-06 ENCOUNTER — Telehealth: Payer: Self-pay

## 2020-09-06 LAB — CBC WITH DIFFERENTIAL/PLATELET
Basophils Absolute: 0 10*3/uL (ref 0.0–0.2)
Basos: 0 %
EOS (ABSOLUTE): 0 10*3/uL (ref 0.0–0.4)
Eos: 0 %
Hematocrit: 37.7 % (ref 34.0–46.6)
Hemoglobin: 12.2 g/dL (ref 11.1–15.9)
Immature Grans (Abs): 0 10*3/uL (ref 0.0–0.1)
Immature Granulocytes: 0 %
Lymphocytes Absolute: 2 10*3/uL (ref 0.7–3.1)
Lymphs: 20 %
MCH: 26.9 pg (ref 26.6–33.0)
MCHC: 32.4 g/dL (ref 31.5–35.7)
MCV: 83 fL (ref 79–97)
Monocytes Absolute: 0.8 10*3/uL (ref 0.1–0.9)
Monocytes: 8 %
Neutrophils Absolute: 7.2 10*3/uL — ABNORMAL HIGH (ref 1.4–7.0)
Neutrophils: 72 %
Platelets: 281 10*3/uL (ref 150–450)
RBC: 4.54 x10E6/uL (ref 3.77–5.28)
RDW: 14.1 % (ref 11.7–15.4)
WBC: 10.1 10*3/uL (ref 3.4–10.8)

## 2020-09-06 LAB — COMPREHENSIVE METABOLIC PANEL
ALT: 9 IU/L (ref 0–32)
AST: 15 IU/L (ref 0–40)
Albumin/Globulin Ratio: 1.5 (ref 1.2–2.2)
Albumin: 4.3 g/dL (ref 3.8–4.9)
Alkaline Phosphatase: 71 IU/L (ref 44–121)
BUN/Creatinine Ratio: 21 (ref 9–23)
BUN: 14 mg/dL (ref 6–24)
Bilirubin Total: 0.2 mg/dL (ref 0.0–1.2)
CO2: 24 mmol/L (ref 20–29)
Calcium: 9.6 mg/dL (ref 8.7–10.2)
Chloride: 104 mmol/L (ref 96–106)
Creatinine, Ser: 0.66 mg/dL (ref 0.57–1.00)
Globulin, Total: 2.8 g/dL (ref 1.5–4.5)
Glucose: 96 mg/dL (ref 65–99)
Potassium: 3.5 mmol/L (ref 3.5–5.2)
Sodium: 142 mmol/L (ref 134–144)
Total Protein: 7.1 g/dL (ref 6.0–8.5)
eGFR: 106 mL/min/{1.73_m2} (ref >59)

## 2020-09-06 LAB — IGG, IGA, IGM
IgA/Immunoglobulin A, Serum: 170 mg/dL (ref 87–352)
IgG (Immunoglobin G), Serum: 1550 mg/dL (ref 586–1602)
IgM (Immunoglobulin M), Srm: 118 mg/dL (ref 26–217)

## 2020-09-06 NOTE — Telephone Encounter (Signed)
-----   Message from Glean Salvo, NP sent at 09/06/2020  6:01 AM EDT ----- Sent my chart message: Ashanta,  Blood work looks good.  We will continue with Ocrevus every 6 months.  Please let me know if you have any questions. Maralyn Sago

## 2020-09-29 ENCOUNTER — Other Ambulatory Visit: Payer: Self-pay | Admitting: Obstetrics and Gynecology

## 2020-10-02 ENCOUNTER — Ambulatory Visit
Admission: RE | Admit: 2020-10-02 | Discharge: 2020-10-02 | Disposition: A | Discharge status: Home / Self Care | Payer: BC Managed Care – PPO | Source: Ambulatory Visit | Attending: Family Medicine | Admitting: Family Medicine

## 2020-10-02 ENCOUNTER — Other Ambulatory Visit: Payer: Self-pay

## 2020-10-02 ENCOUNTER — Other Ambulatory Visit: Payer: Self-pay | Admitting: Family Medicine

## 2020-10-02 DIAGNOSIS — N6310 Unspecified lump in the right breast, unspecified quadrant: Secondary | ICD-10-CM

## 2020-11-09 ENCOUNTER — Telehealth: Payer: Self-pay | Admitting: *Deleted

## 2020-11-09 NOTE — Telephone Encounter (Signed)
PA for Aimovig 70mg  started on covermymeds (key ). Pt has pharmacy coverage through [facility] [phone]). Decision pending.

## 2020-11-13 NOTE — Telephone Encounter (Signed)
PA approved through 11/08/2021.

## 2020-12-05 ENCOUNTER — Telehealth: Payer: Self-pay

## 2020-12-05 ENCOUNTER — Encounter: Payer: Self-pay | Admitting: Neurology

## 2020-12-05 ENCOUNTER — Ambulatory Visit (INDEPENDENT_AMBULATORY_CARE_PROVIDER_SITE_OTHER): Payer: BC Managed Care – PPO | Admitting: Neurology

## 2020-12-05 VITALS — BP 119/86 | HR 90 | Ht 66.0 in | Wt 195.0 lb

## 2020-12-05 DIAGNOSIS — R5383 Other fatigue: Secondary | ICD-10-CM | POA: Diagnosis not present

## 2020-12-05 DIAGNOSIS — G35 Multiple sclerosis: Secondary | ICD-10-CM | POA: Diagnosis not present

## 2020-12-05 DIAGNOSIS — G43009 Migraine without aura, not intractable, without status migrainosus: Secondary | ICD-10-CM | POA: Diagnosis not present

## 2020-12-05 DIAGNOSIS — R269 Unspecified abnormalities of gait and mobility: Secondary | ICD-10-CM

## 2020-12-05 MED ORDER — VENLAFAXINE HCL ER 37.5 MG PO CP24: 37.5000 mg | ORAL_CAPSULE | Freq: Every day | ORAL | 11 refills | Status: DC

## 2020-12-05 MED ORDER — AJOVY 225 MG/1.5ML ~~LOC~~ SOAJ: 225.0000 mg | SUBCUTANEOUS | 11 refills | Status: DC

## 2020-12-05 MED ORDER — UBRELVY 50 MG PO TABS: ORAL_TABLET | ORAL | 11 refills | Status: DC

## 2020-12-05 MED ORDER — ARMODAFINIL 50 MG PO TABS: 100.0000 mg | ORAL_TABLET | Freq: Every day | ORAL | 5 refills | Status: DC

## 2020-12-05 MED ORDER — MODAFINIL 100 MG PO TABS: 100.0000 mg | ORAL_TABLET | Freq: Every day | ORAL | 5 refills | Status: DC

## 2020-12-05 NOTE — Addendum Note (Signed)
Addended by: Levert Feinstein on: 12/05/2020 04:00 PM   Modules accepted: Orders

## 2020-12-05 NOTE — Progress Notes (Addendum)
Chief Complaint  Patient presents with   Follow-up    New room w/ husband, Dana Duke. Early follow up for MS. Concerned about increased fatigue, falls, bowel/bladder incontinence, headaches, weakness, eye pain. She has continued Ocrevus. Completed visit with ophthalmologist this morning. He prescribed a tapering dose of Prednisone (not started yet).      ASSESSMENT AND PLAN  Dana Duke is a 50 y.o. female   Relapsing remitting multiple sclerosis  Ocrelizumab since fall 2020,  MRI of the brain with without contrast in November 2021: Multiple supratentorium and infratentorial chronic demyelinating plaque, no acute plaque, no change from October 2020    Fatigue  Add on Provigil 100 mg daily  Chronic migraine  Fail to response to aimovig, changed to ajovy 225 mg every month  Imitrex 100 mg as needed was not helpful  Will try Ubrelvy as needed.   Gait abnormality  Referred to physical therapy  DIAGNOSTIC DATA (LABS, IMAGING, TESTING) - I reviewed patient records, labs, notes, testing and imaging myself where available.  MRI of the brain with without contrast in November 2021: Multiple supratentorium and infratentorial chronic demyelinating plaque, no acute plaque, no change from October 2020  MRI of the cervical spine with and without contrast in July 2021 1.   Multiple T2 hyperintense foci in the spinal cord and brain as detailed above.  Discrete foci are located adjacent to C1-C2, C2, C3, C5-C6, C6 and C7.  None of the foci enhance with contrast.  Compared to the MRI dated 02/21/2018, there do not appear to be any new lesions. 2.   Minimal disc degenerative changes C3-C4 and C4-C5 that do not lead to nerve root compression.  Laboratory evaluation in May 2022: Normal IgG IgA IgM, CMP, creatinine 0.66, CBC, hemoglobin 12.2   MEDICAL HISTORY:  Dana Duke is a 50 year old female, follow-up for relapsing remitting multiple sclerosis, primary care physician is Dr. Jean Rosenthal,  Caroline More, MD  Since 2011 she noticed gradual onset slow worsening gait difficulty, she tends to drag her foot across the floor, stiff, has difficulty going upstairs, because of knee pain, she suffered a history of right ankle fracture in 2010, seems to dragging her right foot across the floor more, also complains urinary urgency, no bowel incontinence   MRI at [facility] in May 7th 2015, without contrast   MRI cervical spine (without) demonstrating at least 4 spinal cord T2 hyperintensities at cervico-medullary junction, C2, C3-4 and C6.  MRI brain (without) demonstrating multiple (> 20) round and ovoid, periventricular, callosal and peri-callosal, subcortical, juxtacortical, pontine and cerebellar T2 hyperintensities. Some of these are perpendicular to the lateral ventricles on axial and sagittal views. Some of these are hypointense on T1 views. Findings suspicious for demyelinating disease   CSF showed 5 oligoclonal banding, elevated IgG index   Extensive laboratory evaluations showed normal or negative CBC, CMP, Lyme titer, ANA, NMO, TSH, B12, folic acid, RPR. Positive varicellar zoster antibody   The visual evoked response test above was within normal limits on the left, with prolongation of the P100 latency on the right.    JC virus antibody was 0.15 negative in 10/18/2013   MRI of brain, and cervical spine with contrast, showed no contrast enhancement, there was no lesions noticed in MRI thoracic spine,   She started Plegridy since July 2015,  began full dose injection since Sept 15 2015. Complains of injection site reactio, lasting 2-4 weeks, she only has mild flu like illness     She was started on  prednisone  tapering since October 16,2015 for worsening gait abnormality, fall,  tolerating the medication well, seems to have mild improvement in her gait difficulty   She also complains of frequent headaches, consistent with migraines, previously tried nortriptyline 20 mg at night as  preventive medication    Around 2016, she began to noticed increased lower extremity spasticity, gait abnormality, also developed urinary frequency, urgency,    MRI of the brain with and without contrast in November 2016:  abnormal MRI of the brain with and without contrast showing T2/FLAIR hyperintense foci at the cervicomedullary junction and within the pons, midbrain and cerebellum in the posterior fossa. Additionally, there are many T2/FLAIR hyperintense foci in the cerebral hemispheres, predominantly in the periventricular white matter.  The foci are consistent with chronic demyelinating plaques from multiple sclerosis.     None of the foci appears to be acute and there are no enhancing lesions.    abnormal MRI of the cervical spine showing T2 hyperintense foci within the spinal cord at the cervicomedullary junction, C2, C3, C5C6 and C6 as detailed above.   Additional foci are noted within the pons.  None of the foci appears to be acute. No significant degenerative changes are noted.   The lesions are consistent with chronic demyelinating plaques as would be seen in multiple sclerosis. The findings are similar to the MRI report from 08/19/2013.    She started Tysarbri infusion since January 2017,   MRI of the brain without contrast in November 2017:Abnormal MRI scan of the brain with and without contrast showing multiple periventricular, periatrial, subcortical and brainstem white matter hyperintensities consistent with chronic demyelinating disease. No enhancing lesions are noted. Overall no significant change compared with previous MRI scan dated 03/01/2015   Over the years, she has increased gait abnormality gait abnormality, failed to respond well to Ampyra,    MRI cervical spine 01/02/2018, demyelinating plaque C2, 3, C5-6 and 7 level, no change compared to MRI in 2016   MRI of the brain with without contrast few small round periventricular subcortical, pontine, and cerebellar  demyelinating plaque, no acute findings no change compared to MRI in 2018   Laboratory evaluation showed negative anti-Tysarbri antibody,   Started ocrelizumab since fall of  2020  MRI of the brain without contrast February 12, 2019, multiple T2/flair hyperintensity foci in the brainstem, spinal cord, cerebellum, cerebral hemisphere, in a pattern, and configuration, consistent with chronic demyelinating plaque associated with multiple sclerosis.  There was no change compared to previous MRI in November 2019.  No contrast-enhancement   UPDATE Jan 7th 2021: She came to clinic earlier than expected for worsening migraine headaches, she had 7 days of migraine around Christmas time, had recurrent migraine again over the past 4 days, failed multiple home dose of Imitrex, today she still complains 6 out of 10 right temporal area pounding headache, worsening with movement, light noise sensitivity, with mild nausea   Her headache failed to improve with 50 mg  Ubrevyl sample, but much improved after she was given IV Depacon,,along with 10 mg of Compazine Toradol 30 mg  UPDATE December 05 2020: She had right eye pain, was seen by ophthalmology today, diagnosed was right optic neuritis, but she denies visual change, was giving a steroid Dosepak H,  She still complains of increased migraine headache over the past 2 months, almost daily basis, aimovig monthly does not help,  Continue complains worsening gait abnormality, has worsening bowel and bladder incontinence, is seen by urologist, pelvic physical therapy,  Also complains of increased to fatigue,  PHYSICAL EXAM:   Vitals:   12/05/20 1310  Weight: 195 lb (88.5 kg)  Height: 5\' 6"  (1.676 m)   Not recorded     Body mass index is 31.47 kg/m.  PHYSICAL EXAMNIATION:  Gen: NAD, conversant, well nourised, well groomed                     Cardiovascular: Regular rate rhythm, no peripheral edema, warm, nontender. Eyes: Conjunctivae clear without  exudates or hemorrhage Neck: Supple, no carotid bruits. Pulmonary: Clear to auscultation bilaterally   NEUROLOGICAL EXAM:  MENTAL STATUS: Speech/cognition: Awake, alert, oriented to history taking and casual conversation   CRANIAL NERVES: CN II: Visual fields are full to confrontation. Pupils are round equal and briskly reactive to light. CN III, IV, VI: extraocular movement are normal. No ptosis. CN V: Facial sensation is intact to light touch CN VII: Face is symmetric with normal eye closure  CN VIII: Hearing is normal to causal conversation. CN IX, X: Phonation is normal. CN XI: Head turning and shoulder shrug are intact  MOTOR: Upper extremity motor strength is normal, mild spasticity of bilateral lower extremity, mild to moderate hip flexion, ankle dorsiflexion weakness  REFLEXES: Reflexes are 3 and symmetric at the biceps, triceps, knees, and ankles. Plantar responses are flexor.  SENSORY: Intact to light touch, pinprick and vibratory sensation are intact in fingers and toes.  COORDINATION: There is no trunk or limb dysmetria noted.  GAIT/STANCE: She needs push-up to get up from seated position, spastic, unsteady,  REVIEW OF SYSTEMS:  Full 14 system review of systems performed and notable only for as above All other review of systems were negative.   ALLERGIES: Allergies  Allergen Reactions   Sulfa Antibiotics Nausea And Vomiting    HOME MEDICATIONS: Current Outpatient Medications  Medication Sig Dispense Refill   Cholecalciferol 25 MCG (1000 UT) tablet Take 1,000 Units by mouth daily.     DULoxetine (CYMBALTA) 60 MG capsule Take 1 capsule (60 mg total) by mouth daily. 30 capsule 11   Erenumab-aooe (AIMOVIG) 70 MG/ML SOAJ Inject 70 mg into the skin every 30 (thirty) days. 1 mL 11   MYRBETRIQ 50 MG TB24 tablet Take 50 mg by mouth daily.     ocrelizumab (OCREVUS) 300 MG/10ML injection Inject into the vein.     ondansetron (ZOFRAN ODT) 4 MG disintegrating tablet  Take 1 tablet (4 mg total) by mouth every 8 (eight) hours as needed. #20 per month. 60 tablet 1   SUMAtriptan (IMITREX) 100 MG tablet Take 1 tablet (100 mg total) by mouth once as needed for up to 1 dose for migraine. May repeat in 2 hours if headache persists or recurs. 12 tablet 11   No current facility-administered medications for this visit.   Facility-Administered Medications Ordered in Other Visits  Medication Dose Route Frequency Provider Last Rate Last Admin   gadopentetate dimeglumine (MAGNEVIST) injection 20 mL  20 mL Intravenous Once PRN Levert Feinstein, MD        PAST MEDICAL HISTORY: Past Medical History:  Diagnosis Date   Ankle fracture, right    History of hysterectomy    Knee joint dislocation    bilateral   Migraines    Multiple sclerosis (HCC)     PAST SURGICAL HISTORY: Past Surgical History:  Procedure Laterality Date   ABDOMINAL HYSTERECTOMY     CESAREAN SECTION     MYOMECTOMY      FAMILY HISTORY: Family History  Problem Relation Age of Onset   Diabetes Mother    Osteoarthritis Other    Heart disease Other    Rheumatic fever Other    Hypertension Other     SOCIAL HISTORY: Social History   Socioeconomic History   Marital status: Married    Spouse name: Dana Duke   Number of children: 1   Years of education: college   Highest education level: Not on file  Occupational History    Employer: [employer]    Comment: Does not work  Tobacco Use   Smoking status: Never   Smokeless tobacco: Never  Substance and Sexual Activity   Alcohol use: No   Drug use: No   Sexual activity: Not on file  Other Topics Concern   Not on file  Social History Narrative   Patient is married. Dana Duke).    Patient is unemployed.   Education- College education   Right handed.   Caffeine- None   Social Determinants of Health   Financial Resource Strain: Not on file  Food Insecurity: Not on file  Transportation Needs: Not on file  Physical Activity: Not on file   Stress: Not on file  Social Connections: Not on file  Intimate Partner Violence: Not on file      Levert Feinstein, M.D. Ph.D.  [facility] [address] [place], [place] [address] Ph: [phone] Fax: [phone]  CC:  Harvie Heck, MD [address] [place],  [place] [address]  Harvie Heck, MD    Ophthalmology evaluation on January 17, 2021: Ocular eye OD, no cause for pain found, care for dilated examination, possible migraine headache, no signs of current or previous episode of optic neuritis, intermittent exotropia,

## 2020-12-05 NOTE — Telephone Encounter (Signed)
Meds ordered this encounter  Medications   Armodafinil 50 MG tablet    Sig: Take 2 tablets (100 mg total) by mouth daily.    Dispense:  60 tablet    Refill:  5

## 2020-12-05 NOTE — Telephone Encounter (Signed)
Referral sent to neuro rehab. P: [id number].

## 2020-12-05 NOTE — Telephone Encounter (Signed)
I submitted a PA request for modafinil on CMM, Key: [id number].   Awaiting determination.

## 2020-12-05 NOTE — Telephone Encounter (Signed)
Received a denial from [facility] stating the patient must first try armodafinil. We will send this to Dr. Terrace Arabia for review.

## 2020-12-05 NOTE — Telephone Encounter (Signed)
[facility] and patient notified of this update.

## 2020-12-06 ENCOUNTER — Ambulatory Visit: Payer: BC Managed Care – PPO | Admitting: Neurology

## 2020-12-06 ENCOUNTER — Telehealth: Payer: Self-pay | Admitting: *Deleted

## 2020-12-06 NOTE — Telephone Encounter (Addendum)
Pt has pharmacy coverage through [employer] [place] [phone]).  PA for Ubrelvy 50mg  completed on covermymeds (key: [id number]). Approved through 02/27/2021.  PA for Ajovy 225mg  started on covermymeds (key: [id number]). Approved through 02/27/2021.

## 2020-12-08 ENCOUNTER — Other Ambulatory Visit: Payer: Self-pay

## 2020-12-08 ENCOUNTER — Ambulatory Visit: Payer: BC Managed Care – PPO | Attending: Neurology

## 2020-12-08 DIAGNOSIS — R2689 Other abnormalities of gait and mobility: Secondary | ICD-10-CM | POA: Diagnosis not present

## 2020-12-08 DIAGNOSIS — M6281 Muscle weakness (generalized): Secondary | ICD-10-CM | POA: Diagnosis present

## 2020-12-08 DIAGNOSIS — R2681 Unsteadiness on feet: Secondary | ICD-10-CM | POA: Insufficient documentation

## 2020-12-08 NOTE — Therapy (Signed)
[facility] [address] [place], [place], [address] Phone: [phone]   Fax:  [phone]  Physical Therapy Evaluation  Patient Details  Name: Dana Duke MRN: [id number] Date of Birth: 1971-02-22 Referring Provider (PT): Levert Feinstein   Encounter Date: 12/08/2020   PT End of Session - 12/08/20 0936     Visit Number 1    Number of Visits 13    Date for PT Re-Evaluation 02/02/21    Authorization Type BCBS with 30 visit limit. (used 9 visits in pelvic PT at time of this PT eval and still in pelvic health PT)    PT Start Time [id number]    PT Stop Time 1020    PT Time Calculation (min) 46 min    Equipment Utilized During Treatment Gait belt    Activity Tolerance Patient limited by fatigue    Behavior During Therapy WFL for tasks assessed/performed             Past Medical History:  Diagnosis Date   Ankle fracture, right    History of hysterectomy    Knee joint dislocation    bilateral   Migraines    Multiple sclerosis (HCC)     Past Surgical History:  Procedure Laterality Date   ABDOMINAL HYSTERECTOMY     CESAREAN SECTION     MYOMECTOMY      There were no vitals filed for this visit.    Subjective Assessment - 12/08/20 0938     Subjective 50 year old female, follow-up for relapsing remitting multiple sclerosis, primary care physician is Dr. Jean Rosenthal, Caroline More, MD   Since 2011 she noticed gradual onset slow worsening gait difficulty, she tends to drag her foot across the floor, stiff, has difficulty going upstairs, because of knee pain, she suffered a history of right ankle fracture in 2010, seems to dragging her left foot across the floor more, also complains urinary urgency, no bowel incontinence. Currently in pelvic health PT.  Pt reports she was actually diagnosed around 2014. Pt reports that she is always fatigued. Limits her walking due to this as falls when gets tired. Last fall about 6 weeks ago in bathroom  and fell forward on face. Reports 4-5 falls in last 6 months. Reports that if she bend down to get something she will often times go all the way down. Reports she drags left foot more. Pt reports she can only walk about 100' before needs to sit. Not walking with any AD but usually holds on to husband when they go out. Reports tried walker in PT before and it would just tip over with her. Uses powerchair for longer distances.    Patient Stated Goals Pt wants to be able to walk better with less fatigue and less pain.    Currently in Pain? Yes    Pain Score 6     Pain Location Leg    Pain Orientation Left    Pain Descriptors / Indicators Burning    Pain Type Chronic pain    Pain Onset More than a month ago    Pain Frequency Constant    Multiple Pain Sites Yes    Pain Score 3   increases with pressure to lateral ankle where had fractured.   Pain Location Ankle    Pain Orientation Right    Pain Descriptors / Indicators Sore    Pain Type Chronic pain    Pain Onset More than a month ago    Pain Frequency Intermittent  Aggravating Factors  pressure on lateral ankle                [facility] PT Assessment - 12/08/20 0947       Assessment   Medical Diagnosis relapsing remitting MS, gait difficulty, migraine without aura, fatigue    Referring Provider (PT) Levert Feinstein    Onset Date/Surgical Date 12/05/20    Hand Dominance Right    Prior Therapy Had therapy for MS last fall. Currently in pelvic health PT for bladder issues. Has used about 9 visits so far and has 2 left for now.      Precautions   Precautions Fall      Balance Screen   Has the patient fallen in the past 6 months Yes    How many times? 5    Has the patient had a decrease in activity level because of a fear of falling?  Yes    Is the patient reluctant to leave their home because of a fear of falling?  Yes      Home Environment   Living Environment Private residence    Living Arrangements Spouse/significant other     Available Help at Discharge Family    Type of Home --   townhouse   Home Access Stairs to enter    Entrance Stairs-Number of Steps 1    Entrance Stairs-Rails None    Home Layout Two level;Able to live on main level with bedroom/bathroom    Alternate Level Stairs-Number of Steps 12    Alternate Level Stairs-Rails Right    Home Equipment Walker - 4 wheels;Cane - quad;Cane - single point;Tub bench;Bedside commode;Toilet riser;Wheelchair - power      Prior Function   Level of Independence Needs assistance with ADLs;Needs assistance with gait   usually goes out with husband and holds his arm due to fatigue and fall risk   Vocation Unemployed    Leisure build your family   has daughter in medical school     Cognition   Overall Cognitive Status Within Functional Limits for tasks assessed      Sensation   Light Touch Appears Intact    Additional Comments intact to light touch      Coordination   Gross Motor Movements are Fluid and Coordinated No   RAMs slower on left   Heel Shin Test impaired on left      ROM / Strength   AROM / PROM / Strength Strength      Strength   Strength Assessment Site Shoulder;Elbow;Hand;Hip;Knee;Ankle    Right/Left Shoulder Right;Left    Right Shoulder Flexion 4/5    Left Shoulder Flexion 4/5    Right/Left Elbow Right;Left    Right Elbow Flexion 4/5    Right Elbow Extension 4/5    Left Elbow Flexion 4/5    Left Elbow Extension 4/5    Right/Left hand Right;Left    Right Hand Gross Grasp Functional    Left Hand Gross Grasp Functional    Right/Left Hip Right;Left    Right Hip Flexion 3+/5    Right Hip ABduction 3+/5    Left Hip Flexion 3+/5    Left Hip ABduction 3+/5    Right/Left Knee Right;Left    Right Knee Flexion 4/5    Right Knee Extension 4/5    Left Knee Flexion 3+/5    Left Knee Extension 3+/5   trouble maintaining with catch and release   Right/Left Ankle Right;Left    Right Ankle Dorsiflexion 4/5  Left Ankle Dorsiflexion 3+/5    trouble maintaining with catch and release     Transfers   Transfers Sit to Stand;Stand to Sit    Sit to Stand 5: Supervision    Five time sit to stand comments  14.5 sec without hands from chair    Stand to Sit 5: Supervision      Ambulation/Gait   Ambulation/Gait Yes    Ambulation/Gait Assistance 4: Min guard;4: Min assist    Ambulation/Gait Assistance Details Pt has ataxic gait. Shuffles feet with decrease stance time on right and increased lateral sway.    Ambulation Distance (Feet) 75 Feet    Assistive device None    Gait Pattern Decreased step length - right;Decreased step length - left;Step-through pattern;Decreased dorsiflexion - right;Decreased dorsiflexion - left;Ataxic    Ambulation Surface Level;Indoor    Gait velocity 13.94 sec=0.80m/s      Standardized Balance Assessment   Standardized Balance Assessment Timed Up and Go Test      Timed Up and Go Test   TUG Normal TUG    Normal TUG (seconds) 14.08   without AD                       Objective measurements completed on examination: See above findings.               PT Education - 12/08/20 1237     Education Details PT plan of care    Person(s) Educated Patient    Methods Explanation    Comprehension Verbalized understanding              PT Short Term Goals - 12/08/20 1249       PT SHORT TERM GOAL #1   Title Pt will be independent with initial HEP for strengthening and balance.    Time 4    Period Weeks    Status New    Target Date 01/05/21      PT SHORT TERM GOAL #2   Title Pt will decrease 5 x sit to stand from 14.5 sec to <12 sec for improved balance and functional strength.    Baseline 12/08/20 14.5 sec without AD    Time 4    Period Weeks    Status New    Target Date 01/05/21      PT SHORT TERM GOAL #3   Title Pt will ambulate >250' with LRAD supervision for improved household and short community distances.    Time 4    Period Weeks    Status New    Target Date  01/05/21      PT SHORT TERM GOAL #4   Title Berg balance will be assessed to further assess balance and LTG written.    Time 4    Period Weeks    Status New    Target Date 01/05/21      PT SHORT TERM GOAL #5   Title Pt will be assessed for possible AFO need to see if could improve stability with gait.    Time 4    Period Weeks    Status New    Target Date 01/05/21               PT Long Term Goals - 12/08/20 1252       PT LONG TERM GOAL #1   Title Pt will verbalize understanding of fall prevention strategies and energy conservation. (LTGs due 02/02/21)    Time 8    Period  Weeks    Status New    Target Date 02/02/21      PT LONG TERM GOAL #2   Title Berg goal TBD    Time 8    Period Weeks    Status New    Target Date 02/02/21      PT LONG TERM GOAL #3   Title Pt will increase gait speed from 0.513m/s to >0.7262m/s for improved gait safety in community.    Baseline 12/08/20 0.493m/s    Time 8    Period Weeks    Status New    Target Date 02/02/21      PT LONG TERM GOAL #4   Title Pt will ambulate > 400' on paved outdoor/various indoor surfaces with LRAD modified independent for improved functional mobility.    Time 8    Period Weeks    Status New    Target Date 02/02/21                    Plan - 12/08/20 1238     Clinical Impression Statement Pt is a 50 year old female with diagnosis of relapsing remitting multiple sclerosis. Symptoms first developed in 2011 and reports being diagnosed around 2014. Pt has weakness in BLE left more so than right. 3+/5 strength in hips, left knee and ankle. Pt has impaired coordination in LLE. She ambulates with almost ataxic pattern with decreased foot clearance bilateral and decreased step length. Unsteady with gait and fatigues quickly. Gait speed of 0.63m/s indicates decreased safety with community ambulation. Pt uses powerchair for longer distances due to fatigue and weakness. History of multiple falls. Pt is high fall  risk based on 5 x sit to stand of 14.5 sec and TUG of 14.08 sec. Pt also reports pain in left leg. Pt will benefit from skilled PT to address strength, balance and functional mobility deficits to maximize safety and function.    Personal Factors and Comorbidities Time since onset of injury/illness/exacerbation    Examination-Activity Limitations Locomotion Level;Transfers;Stairs;Stand    Examination-Participation Restrictions Cleaning;Community Activity;Meal Prep    Stability/Clinical Decision Making Evolving/Moderate complexity    Clinical Decision Making Moderate    Rehab Potential Good    PT Frequency 2x / week   followed by 1x/week for 4 weeks. Plus eval   PT Duration 4 weeks    PT Treatment/Interventions ADLs/Self Care Home Management;DME Instruction;Cryotherapy;Gait training;Stair training;Functional mobility training;Therapeutic activities;Therapeutic exercise;Balance training;Neuromuscular re-education;Manual techniques;Orthotic Fit/Training;Patient/family education;Vestibular;Passive range of motion    PT Next Visit Plan Assess Berg Balance and update LTG. Trial gait with FWW to try to determine if will be beneficial. May not help due to ataxia. Begin strengthening and balance HEP. Monitor fatigue levels. Trial AFO to see if helps with stability?    Consulted and Agree with Plan of Care Patient             Patient will benefit from skilled therapeutic intervention in order to improve the following deficits and impairments:  Abnormal gait, Decreased endurance, Decreased balance, Decreased mobility, Decreased knowledge of use of DME, Decreased strength, Pain  Visit Diagnosis: Other abnormalities of gait and mobility  Muscle weakness (generalized)  Unsteadiness on feet     Problem List Patient Active Problem List   Diagnosis Date Noted   Relapsing remitting multiple sclerosis (HCC) 12/05/2020   Fatigue 12/05/2020   Spasticity 06/29/2015   ECG abnormality 06/23/2014    Migraine without aura and without status migrainosus, not intractable 06/16/2014   Nonspecific abnormal electrocardiogram (ECG) (EKG) 06/14/2014  Headache 10/28/2013   Multiple sclerosis (HCC) 08/23/2013   Gait difficulty 02/22/2013    Ronn Melena, PT, DPT, NCS 12/08/2020, 12:56 PM  [address] [facility] [address] [place], [place], [address] Phone: [phone]   Fax:  [phone]  Name: Remedios Mckone MRN: [id number] Date of Birth: [birth year]

## 2020-12-11 ENCOUNTER — Telehealth: Payer: Self-pay | Admitting: Neurology

## 2020-12-11 NOTE — Telephone Encounter (Signed)
Pt called asking what is the status of her medication she is having severe headaches and does not have any medication to help with them. Pt requesting a call back.

## 2020-12-11 NOTE — Telephone Encounter (Signed)
Telephone note from 12/06/20:  Pt has pharmacy coverage through [employer] [place] [phone]).   PA for Ubrelvy 50mg  completed on covermymeds (key: [id number]). Approved through 02/27/2021.   PA for Ajovy 225mg  started on covermymeds (key: [id number]). Approved through 02/27/2021.   _____________________________________  This information was also given to the pharmacy.  _____________________________________  I called the patient and provided her with this update. She will contact the pharmacy for the prescriptions.

## 2020-12-12 ENCOUNTER — Ambulatory Visit: Payer: BC Managed Care – PPO

## 2020-12-13 ENCOUNTER — Other Ambulatory Visit: Payer: Self-pay

## 2020-12-13 ENCOUNTER — Ambulatory Visit: Payer: BC Managed Care – PPO

## 2020-12-13 DIAGNOSIS — R2689 Other abnormalities of gait and mobility: Secondary | ICD-10-CM | POA: Diagnosis not present

## 2020-12-13 DIAGNOSIS — R2681 Unsteadiness on feet: Secondary | ICD-10-CM

## 2020-12-13 DIAGNOSIS — M6281 Muscle weakness (generalized): Secondary | ICD-10-CM

## 2020-12-13 NOTE — Therapy (Signed)
[facility] [address] [place], [place], [address] Phone: [phone]   Fax:  [phone]  Physical Therapy Treatment  Patient Details  Name: Dana Duke MRN: [id number] Date of Birth: 03-23-1971 Referring Provider (PT): Levert Feinstein   Encounter Date: 12/13/2020   PT End of Session - 12/13/20 1516     Visit Number 2    Number of Visits 13    Date for PT Re-Evaluation 02/02/21    Authorization Type BCBS with 30 visit limit. (used 9 visits in pelvic PT at time of this PT eval and still in pelvic health PT)    PT Start Time 1516    PT Stop Time 1602    PT Time Calculation (min) 46 min    Equipment Utilized During Treatment Gait belt    Activity Tolerance Patient limited by fatigue    Behavior During Therapy WFL for tasks assessed/performed             Past Medical History:  Diagnosis Date   Ankle fracture, right    History of hysterectomy    Knee joint dislocation    bilateral   Migraines    Multiple sclerosis (HCC)     Past Surgical History:  Procedure Laterality Date   ABDOMINAL HYSTERECTOMY     CESAREAN SECTION     MYOMECTOMY      There were no vitals filed for this visit.   Subjective Assessment - 12/13/20 1516     Subjective Pt reports she is doing ok. Finished pelvic health PT this morning as was not helping her issue.    Patient Stated Goals Pt wants to be able to walk better with less fatigue and less pain.    Currently in Pain? Yes    Pain Score --   "just painful"   Pain Location Knee    Pain Orientation Left    Pain Descriptors / Indicators Aching;Burning    Pain Type Chronic pain    Pain Onset More than a month ago    Pain Frequency Constant    Pain Score 3    Pain Location Head    Pain Descriptors / Indicators Aching    Pain Type Chronic pain    Pain Onset More than a month ago    Pain Frequency Intermittent                               [facility] Adult PT  Treatment/Exercise - 12/13/20 1518       Transfers   Transfers Sit to Stand;Stand to Sit    Sit to Stand 5: Supervision    Sit to Stand Details Verbal cues for technique    Stand to Sit 5: Supervision      Ambulation/Gait   Ambulation/Gait Yes    Ambulation/Gait Assistance 4: Min guard    Ambulation/Gait Assistance Details PT trialed FWW and rollator. More play on rolllator and FWW was better of the two. Pt was cued to push down through walker handles and not pull up. Also cued to try to increase left foot clearance. On rollator had pt hold breaks as it tends to get ahead of her. Pt walked in/out of clinic with no AD close SBA/CGA with staggering gait.    Ambulation Distance (Feet) 230 Feet    Assistive device Rolling walker;4-wheeled walker    Gait Pattern Step-through pattern;Ataxic    Ambulation Surface Level;Indoor  Standardized Balance Assessment   Standardized Balance Assessment Berg Balance Test      Berg Balance Test   Sit to Stand Able to stand without using hands and stabilize independently    Standing Unsupported Able to stand 2 minutes with supervision    Sitting with Back Unsupported but Feet Supported on Floor or Stool Able to sit safely and securely 2 minutes    Stand to Sit Sits safely with minimal use of hands    Transfers Able to transfer safely, definite need of hands    Standing Unsupported with Eyes Closed Able to stand 3 seconds    Standing Ubsupported with Feet Together Able to place feet together independently but unable to hold for 30 seconds    From Standing, Reach Forward with Outstretched Arm Loses balance while trying/requires external support    From Standing Position, Pick up Object from Floor Able to pick up shoe, needs supervision    From Standing Position, Turn to Look Behind Over each Shoulder Turn sideways only but maintains balance    Turn 360 Degrees Able to turn 360 degrees safely but slowly    Standing Unsupported, Alternately Place Feet on  Step/Stool Able to complete >2 steps/needs minimal assist    Standing Unsupported, One Foot in [employer] balance while stepping or standing    Standing on One Leg Tries to lift leg/unable to hold 3 seconds but remains standing independently    Total Score 31      Neuro Re-ed    Neuro Re-ed Details  Sit to stand x 5 without UE support with cues to engage core to try to help with stability. Standing at walker with fingertip support: marching in place x 10 with cues again to engage core CGA.      Exercises   Exercises Other Exercises    Other Exercises  Bridges with abdominal bracing x 10 with verbal cues for form and to keep knees slightly apart. Clamshells x 10 bilateral with verbal and tactile cues for form. More challenged on left side.                    PT Education - 12/13/20 1656     Education Details Started on initial HEP    Person(s) Educated Patient    Methods Explanation;Demonstration    Comprehension Verbalized understanding              PT Short Term Goals - 12/13/20 1656       PT SHORT TERM GOAL #1   Title Pt will be independent with initial HEP for strengthening and balance.    Time 4    Period Weeks    Status New    Target Date 01/05/21      PT SHORT TERM GOAL #2   Title Pt will decrease 5 x sit to stand from 14.5 sec to <12 sec for improved balance and functional strength.    Baseline 12/08/20 14.5 sec without AD    Time 4    Period Weeks    Status New    Target Date 01/05/21      PT SHORT TERM GOAL #3   Title Pt will ambulate >250' with LRAD supervision for improved household and short community distances.    Time 4    Period Weeks    Status New    Target Date 01/05/21      PT SHORT TERM GOAL #4   Title Berg balance will be assessed to further  assess balance and LTG written.    Baseline 12/13/20 Berg performed with score of 31/56 and LTG written    Time 4    Period Weeks    Status Achieved    Target Date 01/05/21      PT SHORT  TERM GOAL #5   Title Pt will be assessed for possible AFO need to see if could improve stability with gait.    Time 4    Period Weeks    Status New    Target Date 01/05/21               PT Long Term Goals - 12/13/20 1656       PT LONG TERM GOAL #1   Title Pt will verbalize understanding of fall prevention strategies and energy conservation. (LTGs due 02/02/21)    Time 8    Period Weeks    Status New      PT LONG TERM GOAL #2   Title Pt will improve Berg from 31 to >37/56 for improved balance and decrease fall risk.    Baseline 12/13/20 31/56    Time 8    Period Weeks    Status New      PT LONG TERM GOAL #3   Title Pt will increase gait speed from 0.62m/s to >0.44m/s for improved gait safety in community.    Baseline 12/08/20 0.55m/s    Time 8    Period Weeks    Status New      PT LONG TERM GOAL #4   Title Pt will ambulate > 400' on paved outdoor/various indoor surfaces with LRAD modified independent for improved functional mobility.    Time 8    Period Weeks    Status New                   Plan - 12/13/20 1657     Clinical Impression Statement PT assessed Berg Balance today. Pt is high fall risk with score of 31/56. Trialed gait with RW versus rollator and RW was safer of the 2. Pt still needs CGA due to ataxia for safety. Will need to continue to work on improving control.    Personal Factors and Comorbidities Time since onset of injury/illness/exacerbation    Examination-Activity Limitations Locomotion Level;Transfers;Stairs;Stand    Examination-Participation Restrictions Cleaning;Community Activity;Meal Prep    Stability/Clinical Decision Making Evolving/Moderate complexity    Rehab Potential Good    PT Frequency 2x / week   followed by 1x/week for 4 weeks. Plus eval   PT Duration 4 weeks    PT Treatment/Interventions ADLs/Self Care Home Management;DME Instruction;Cryotherapy;Gait training;Stair training;Functional mobility training;Therapeutic  activities;Therapeutic exercise;Balance training;Neuromuscular re-education;Manual techniques;Orthotic Fit/Training;Patient/family education;Vestibular;Passive range of motion    PT Next Visit Plan Continue gait training with RW to see if could be safe to use on own. Continue with strengthening and balance HEP. Monitor fatigue levels. Trial AFO to see if helps with stability?    Consulted and Agree with Plan of Care Patient             Patient will benefit from skilled therapeutic intervention in order to improve the following deficits and impairments:  Abnormal gait, Decreased endurance, Decreased balance, Decreased mobility, Decreased knowledge of use of DME, Decreased strength, Pain  Visit Diagnosis: Other abnormalities of gait and mobility  Muscle weakness (generalized)  Unsteadiness on feet     Problem List Patient Active Problem List   Diagnosis Date Noted   Relapsing remitting multiple sclerosis (HCC) 12/05/2020  Fatigue 12/05/2020   Spasticity 06/29/2015   ECG abnormality 06/23/2014   Migraine without aura and without status migrainosus, not intractable 06/16/2014   Nonspecific abnormal electrocardiogram (ECG) (EKG) 06/14/2014   Headache 10/28/2013   Multiple sclerosis (HCC) 08/23/2013   Gait difficulty 02/22/2013    Ronn Melena, PT, DPT, NCS 12/13/2020, 5:00 PM  [facility] [address] [place], [place], [address] Phone: [phone]   Fax:  [phone]  Name: Dana Duke MRN: [id number] Date of Birth: 07/12/1970

## 2020-12-13 NOTE — Patient Instructions (Signed)
Access Code: [email or web address] URL: [email or web address] Date: 12/13/2020 Prepared by: Elmer Bales  Exercises Sit to Stand - 1 x daily - 7 x weekly - 2 sets - 5 reps Clamshell - 1 x daily - 5 x weekly - 1 sets - 10 reps Supine Bridge - 1 x daily - 5 x weekly - 1 sets - 10 reps

## 2020-12-19 ENCOUNTER — Ambulatory Visit: Payer: BC Managed Care – PPO | Attending: Neurology

## 2020-12-19 ENCOUNTER — Other Ambulatory Visit: Payer: Self-pay

## 2020-12-19 DIAGNOSIS — R2681 Unsteadiness on feet: Secondary | ICD-10-CM | POA: Insufficient documentation

## 2020-12-19 DIAGNOSIS — M6281 Muscle weakness (generalized): Secondary | ICD-10-CM | POA: Diagnosis present

## 2020-12-19 DIAGNOSIS — R2689 Other abnormalities of gait and mobility: Secondary | ICD-10-CM | POA: Insufficient documentation

## 2020-12-19 NOTE — Therapy (Signed)
[facility] [address] [place], [place], [address] Phone: [phone]   Fax:  [phone]  Physical Therapy Treatment  Patient Details  Name: Dana Duke MRN: [id number] Date of Birth: 02-22-1971 Referring Provider (PT): Levert Feinstein   Encounter Date: 12/19/2020   PT End of Session - 12/19/20 1133     Visit Number 3    Number of Visits 13    Date for PT Re-Evaluation 02/02/21    Authorization Type BCBS with 30 visit limit. (used 9 visits in pelvic PT at time of this PT eval and still in pelvic health PT)    PT Start Time 1130    PT Stop Time 1211    PT Time Calculation (min) 41 min    Equipment Utilized During Treatment Gait belt    Activity Tolerance Patient limited by fatigue    Behavior During Therapy WFL for tasks assessed/performed             Past Medical History:  Diagnosis Date   Ankle fracture, right    History of hysterectomy    Knee joint dislocation    bilateral   Migraines    Multiple sclerosis (HCC)     Past Surgical History:  Procedure Laterality Date   ABDOMINAL HYSTERECTOMY     CESAREAN SECTION     MYOMECTOMY      There were no vitals filed for this visit.   Subjective Assessment - 12/19/20 1133     Subjective Pt denies any changes. No falls.    Patient Stated Goals Pt wants to be able to walk better with less fatigue and less pain.    Currently in Pain? Yes    Pain Score 4     Pain Location Knee    Pain Orientation Left    Pain Descriptors / Indicators Aching;Burning    Pain Type Chronic pain    Pain Onset More than a month ago    Pain Frequency Constant    Pain Onset More than a month ago                               [facility] Adult PT Treatment/Exercise - 12/19/20 1134       Transfers   Transfers Sit to Stand;Stand to Sit    Sit to Stand 5: Supervision    Stand to Sit 5: Supervision      Ambulation/Gait   Ambulation/Gait Yes     Ambulation/Gait Assistance 5: Supervision    Ambulation/Gait Assistance Details Verbal cues to take her time and focus on increasing left foot clearance. She was also cued to stay up in walker and push down versus pull up.    Ambulation Distance (Feet) 230 Feet    Assistive device Rolling walker    Gait Pattern Step-through pattern    Ambulation Surface Level;Indoor      Neuro Re-ed    Neuro Re-ed Details  Sit to stand 5 x 2 on airex getting balance each time. Standing on airex with feet together x 30 sec without UE support then adding in head turns left/right x 10 CGA as pt had increased sway with turning head. Standing on floor: Alternating toe taps on cone x 6 each side min assist with occasional UE support on walker. Step-ups on 4" step with light walker support x 5 each side.      Exercises   Exercises Other Exercises    Other  Exercises  Clamshell for left with red theraband 10 x 2. Bridge with red theraband around thighs opening up at top x 10 with verbal cues to engage core as well.                    PT Education - 12/19/20 1241     Education Details Pt was instructed to add red theraband to her clamshell and bridge exercises.    Person(s) Educated Patient    Methods Explanation;Demonstration    Comprehension Verbalized understanding              PT Short Term Goals - 12/13/20 1656       PT SHORT TERM GOAL #1   Title Pt will be independent with initial HEP for strengthening and balance.    Time 4    Period Weeks    Status New    Target Date 01/05/21      PT SHORT TERM GOAL #2   Title Pt will decrease 5 x sit to stand from 14.5 sec to <12 sec for improved balance and functional strength.    Baseline 12/08/20 14.5 sec without AD    Time 4    Period Weeks    Status New    Target Date 01/05/21      PT SHORT TERM GOAL #3   Title Pt will ambulate >250' with LRAD supervision for improved household and short community distances.    Time 4    Period Weeks     Status New    Target Date 01/05/21      PT SHORT TERM GOAL #4   Title Berg balance will be assessed to further assess balance and LTG written.    Baseline 12/13/20 Berg performed with score of 31/56 and LTG written    Time 4    Period Weeks    Status Achieved    Target Date 01/05/21      PT SHORT TERM GOAL #5   Title Pt will be assessed for possible AFO need to see if could improve stability with gait.    Time 4    Period Weeks    Status New    Target Date 01/05/21               PT Long Term Goals - 12/13/20 1656       PT LONG TERM GOAL #1   Title Pt will verbalize understanding of fall prevention strategies and energy conservation. (LTGs due 02/02/21)    Time 8    Period Weeks    Status New      PT LONG TERM GOAL #2   Title Pt will improve Berg from 31 to >37/56 for improved balance and decrease fall risk.    Baseline 12/13/20 31/56    Time 8    Period Weeks    Status New      PT LONG TERM GOAL #3   Title Pt will increase gait speed from 0.17m/s to >0.30m/s for improved gait safety in community.    Baseline 12/08/20 0.73m/s    Time 8    Period Weeks    Status New      PT LONG TERM GOAL #4   Title Pt will ambulate > 400' on paved outdoor/various indoor surfaces with LRAD modified independent for improved functional mobility.    Time 8    Period Weeks    Status New  Plan - 12/19/20 1242     Clinical Impression Statement Pt was able to demonstrate more control with walker today when she slowed down to control movements. Adding in any UE support greatly improved stability with dynamic balance exercises.    Personal Factors and Comorbidities Time since onset of injury/illness/exacerbation    Examination-Activity Limitations Locomotion Level;Transfers;Stairs;Stand    Examination-Participation Restrictions Cleaning;Community Activity;Meal Prep    Stability/Clinical Decision Making Evolving/Moderate complexity    Rehab Potential Good     PT Frequency 2x / week   followed by 1x/week for 4 weeks. Plus eval   PT Duration 4 weeks    PT Treatment/Interventions ADLs/Self Care Home Management;DME Instruction;Cryotherapy;Gait training;Stair training;Functional mobility training;Therapeutic activities;Therapeutic exercise;Balance training;Neuromuscular re-education;Manual techniques;Orthotic Fit/Training;Patient/family education;Vestibular;Passive range of motion    PT Next Visit Plan Continue gait training with RW to see if could be safe to use on own. Continue with strengthening and balance HEP. Monitor fatigue levels. Trial AFO to see if helps with stability?    Consulted and Agree with Plan of Care Patient             Patient will benefit from skilled therapeutic intervention in order to improve the following deficits and impairments:  Abnormal gait, Decreased endurance, Decreased balance, Decreased mobility, Decreased knowledge of use of DME, Decreased strength, Pain  Visit Diagnosis: Other abnormalities of gait and mobility  Muscle weakness (generalized)  Unsteadiness on feet     Problem List Patient Active Problem List   Diagnosis Date Noted   Relapsing remitting multiple sclerosis (HCC) 12/05/2020   Fatigue 12/05/2020   Spasticity 06/29/2015   ECG abnormality 06/23/2014   Migraine without aura and without status migrainosus, not intractable 06/16/2014   Nonspecific abnormal electrocardiogram (ECG) (EKG) 06/14/2014   Headache 10/28/2013   Multiple sclerosis (HCC) 08/23/2013   Gait difficulty 02/22/2013    Ronn Melena, PT, DPT, NCS 12/19/2020, 12:44 PM  [facility] [address] [place], [place], [address] Phone: [phone]   Fax:  [phone]  Name: Kizzie Cotten MRN: [id number] Date of Birth: October 19, 1970

## 2020-12-27 ENCOUNTER — Other Ambulatory Visit: Payer: Self-pay

## 2020-12-27 ENCOUNTER — Ambulatory Visit: Payer: BC Managed Care – PPO

## 2020-12-27 DIAGNOSIS — M6281 Muscle weakness (generalized): Secondary | ICD-10-CM

## 2020-12-27 DIAGNOSIS — R2689 Other abnormalities of gait and mobility: Secondary | ICD-10-CM | POA: Diagnosis not present

## 2020-12-27 DIAGNOSIS — R2681 Unsteadiness on feet: Secondary | ICD-10-CM

## 2020-12-27 NOTE — Therapy (Signed)
[facility] [address] [place], [place], [address] Phone: [phone]   Fax:  [phone]  Physical Therapy Treatment  Patient Details  Name: Dana Duke MRN: [id number] Date of Birth: 24-Sep-1970 Referring Provider (PT): Levert Feinstein   Encounter Date: 12/27/2020   PT End of Session - 12/27/20 1450     Visit Number 4    Number of Visits 13    Date for PT Re-Evaluation 02/02/21    Authorization Type BCBS with 30 visit limit. (used 9 visits in pelvic PT at time of this PT eval and still in pelvic health PT)    PT Start Time 1448    PT Stop Time 1527    PT Time Calculation (min) 39 min    Equipment Utilized During Treatment Gait belt    Activity Tolerance Patient limited by fatigue    Behavior During Therapy WFL for tasks assessed/performed             Past Medical History:  Diagnosis Date   Ankle fracture, right    History of hysterectomy    Knee joint dislocation    bilateral   Migraines    Multiple sclerosis (HCC)     Past Surgical History:  Procedure Laterality Date   ABDOMINAL HYSTERECTOMY     CESAREAN SECTION     MYOMECTOMY      There were no vitals filed for this visit.   Subjective Assessment - 12/27/20 1450     Subjective Pt reports she is doing well.    Patient Stated Goals Pt wants to be able to walk better with less fatigue and less pain.    Currently in Pain? Yes    Pain Score 4     Pain Location Leg    Pain Orientation Left    Pain Descriptors / Indicators Aching;Burning    Pain Type Chronic pain    Pain Onset More than a month ago    Pain Frequency Constant    Pain Onset More than a month ago                               [facility] Adult PT Treatment/Exercise - 12/27/20 1451       Transfers   Transfers Sit to Stand;Stand to Sit    Sit to Stand 5: Supervision    Stand to Sit 5: Supervision    Comments Sit to stand x 10 from mat without hands with airex  under feet with verbal cues to control movement.      Ambulation/Gait   Ambulation/Gait Yes    Ambulation/Gait Assistance 5: Supervision    Ambulation/Gait Assistance Details Pt was given verbal cues to stay up in walker and push down through arms for more upright posture. With gait outside instructed to pick up walker over surface changes on sidewalk.    Ambulation Distance (Feet) 345 Feet   250 x 1 outside for fire drill   Assistive device Rolling walker    Gait Pattern Step-through pattern;Decreased dorsiflexion - left;Decreased hip/knee flexion - left    Ambulation Surface Level;Indoor      Neuro Re-ed    Neuro Re-ed Details  In // bars: marching walk with light BUE support 8' x 2 then with 1 UE support 8' x 4. Tandem walking with 1 UE support 8' x 4.  Side stepping with light UE support to no support 8' x 4 with verbal cues for  proper foot placement. Standing on rockerboard positioned ant/post: trying to maintain level x 30 sec eyes open, eyes closed 30 sec x 2  with CGA/min assist occasionally, rocking board ant/post x 10 with 1 light UE support. Turned board lateral and performed trying to maintain level x 30 sec then with moving arms overhead/out to side/across chest x 5 CGA. Verbal cues to engage core to help with stability.                       PT Short Term Goals - 12/13/20 1656       PT SHORT TERM GOAL #1   Title Pt will be independent with initial HEP for strengthening and balance.    Time 4    Period Weeks    Status New    Target Date 01/05/21      PT SHORT TERM GOAL #2   Title Pt will decrease 5 x sit to stand from 14.5 sec to <12 sec for improved balance and functional strength.    Baseline 12/08/20 14.5 sec without AD    Time 4    Period Weeks    Status New    Target Date 01/05/21      PT SHORT TERM GOAL #3   Title Pt will ambulate >250' with LRAD supervision for improved household and short community distances.    Time 4    Period Weeks    Status  New    Target Date 01/05/21      PT SHORT TERM GOAL #4   Title Berg balance will be assessed to further assess balance and LTG written.    Baseline 12/13/20 Berg performed with score of 31/56 and LTG written    Time 4    Period Weeks    Status Achieved    Target Date 01/05/21      PT SHORT TERM GOAL #5   Title Pt will be assessed for possible AFO need to see if could improve stability with gait.    Time 4    Period Weeks    Status New    Target Date 01/05/21               PT Long Term Goals - 12/13/20 1656       PT LONG TERM GOAL #1   Title Pt will verbalize understanding of fall prevention strategies and energy conservation. (LTGs due 02/02/21)    Time 8    Period Weeks    Status New      PT LONG TERM GOAL #2   Title Pt will improve Berg from 31 to >37/56 for improved balance and decrease fall risk.    Baseline 12/13/20 31/56    Time 8    Period Weeks    Status New      PT LONG TERM GOAL #3   Title Pt will increase gait speed from 0.34m/s to >0.75m/s for improved gait safety in community.    Baseline 12/08/20 0.52m/s    Time 8    Period Weeks    Status New      PT LONG TERM GOAL #4   Title Pt will ambulate > 400' on paved outdoor/various indoor surfaces with LRAD modified independent for improved functional mobility.    Time 8    Period Weeks    Status New                   Plan - 12/27/20 2055  Clinical Impression Statement Pt was able to increase gait distance today. Continued to focus on using RW and pt did well with controlling it today with more upright posture. Pt is challenged when UE support is removed but continues to show improvement especially when engages core more.    Personal Factors and Comorbidities Time since onset of injury/illness/exacerbation    Examination-Activity Limitations Locomotion Level;Transfers;Stairs;Stand    Examination-Participation Restrictions Cleaning;Community Activity;Meal Prep    Stability/Clinical Decision  Making Evolving/Moderate complexity    Rehab Potential Good    PT Frequency 2x / week   followed by 1x/week for 4 weeks. Plus eval   PT Duration 4 weeks    PT Treatment/Interventions ADLs/Self Care Home Management;DME Instruction;Cryotherapy;Gait training;Stair training;Functional mobility training;Therapeutic activities;Therapeutic exercise;Balance training;Neuromuscular re-education;Manual techniques;Orthotic Fit/Training;Patient/family education;Vestibular;Passive range of motion    PT Next Visit Plan Continue gait training with RW to see if could be safe to use on own. Continue with strengthening and balance with engaging core. Monitor fatigue levels. Trial AFO to see if helps with stability if pt wears appropriate shoes?    Consulted and Agree with Plan of Care Patient             Patient will benefit from skilled therapeutic intervention in order to improve the following deficits and impairments:  Abnormal gait, Decreased endurance, Decreased balance, Decreased mobility, Decreased knowledge of use of DME, Decreased strength, Pain  Visit Diagnosis: Other abnormalities of gait and mobility  Muscle weakness (generalized)  Unsteadiness on feet     Problem List Patient Active Problem List   Diagnosis Date Noted   Relapsing remitting multiple sclerosis (HCC) 12/05/2020   Fatigue 12/05/2020   Spasticity 06/29/2015   ECG abnormality 06/23/2014   Migraine without aura and without status migrainosus, not intractable 06/16/2014   Nonspecific abnormal electrocardiogram (ECG) (EKG) 06/14/2014   Headache 10/28/2013   Multiple sclerosis (HCC) 08/23/2013   Gait difficulty 02/22/2013    Ronn Melena, PT, DPT, NCS 12/27/2020, 9:00 PM  [facility] [address] [place], [place], [address] Phone: [phone]   Fax:  [phone]  Name: Dana Duke MRN: [id number] Date of Birth: 08/19/1970

## 2020-12-29 ENCOUNTER — Other Ambulatory Visit: Payer: Self-pay

## 2020-12-29 ENCOUNTER — Ambulatory Visit: Payer: BC Managed Care – PPO

## 2020-12-29 DIAGNOSIS — M6281 Muscle weakness (generalized): Secondary | ICD-10-CM

## 2020-12-29 DIAGNOSIS — R2689 Other abnormalities of gait and mobility: Secondary | ICD-10-CM | POA: Diagnosis not present

## 2020-12-29 DIAGNOSIS — R2681 Unsteadiness on feet: Secondary | ICD-10-CM

## 2020-12-30 NOTE — Therapy (Signed)
[facility] [address] [place], [place], [address] Phone: [phone]   Fax:  [phone]  Physical Therapy Treatment  Patient Details  Name: Dana Duke MRN: [id number] Date of Birth: [birth year] Referring Provider (PT): Levert Feinstein   Encounter Date: 12/29/2020   PT End of Session - 12/29/20 1451     Visit Number 5    Number of Visits 13    Date for PT Re-Evaluation 02/02/21    Authorization Type BCBS with 30 visit limit. (used 9 visits in pelvic PT at time of this PT eval and still in pelvic health PT)    PT Start Time 1448    PT Stop Time 1530    PT Time Calculation (min) 42 min    Equipment Utilized During Treatment Gait belt    Activity Tolerance Patient limited by fatigue    Behavior During Therapy WFL for tasks assessed/performed             Past Medical History:  Diagnosis Date   Ankle fracture, right    History of hysterectomy    Knee joint dislocation    bilateral   Migraines    Multiple sclerosis (HCC)     Past Surgical History:  Procedure Laterality Date   ABDOMINAL HYSTERECTOMY     CESAREAN SECTION     MYOMECTOMY      There were no vitals filed for this visit.   Subjective Assessment - 12/29/20 1451     Subjective Pt reports she is doing ok. Was tired after last time but not exhausted.    Patient Stated Goals Pt wants to be able to walk better with less fatigue and less pain.    Currently in Pain? Yes    Pain Score 4     Pain Location Leg    Pain Orientation Left    Pain Descriptors / Indicators Aching;Burning    Pain Type Chronic pain    Pain Onset More than a month ago    Pain Frequency Constant    Pain Onset More than a month ago                               [facility] Adult PT Treatment/Exercise - 12/29/20 1453       Ambulation/Gait   Ambulation/Gait Yes    Ambulation/Gait Assistance 5: Supervision    Ambulation/Gait Assistance Details Pt was given  verbal cues to stay up in walker and slow down for more control with engaging core.    Ambulation Distance (Feet) 460 Feet    Assistive device Rolling walker    Gait Pattern Step-through pattern;Decreased dorsiflexion - left    Ambulation Surface Level;Indoor    Gait Comments Gait weaving in and out of 5 cones with RW x 4 bouts CGA. Verbal cues to stay in walker.      Neuro Re-ed    Neuro Re-ed Details  In // bars on rockerboard: positioned ant/post trying to maintain level x 30 sec eyes open with small pertubations with occasional LOB posterior then playing catch with 3.3# med ball x 1 min. Then turned board lateral with trying to maintain level x 30 sec with increased sway, playing catch with 3.3.# med ball with CGA/min assist from PT, then trunk rotation to each side passing medicine ball between people CGA/min assist with losing balance to left at times. Sit to stand from mat without UE support on airex with holding  medicine ball and raising ball overhead to stabilize x 10. Standing on airex with marching in place x 10 CGA/min assist with verbal cues to engage core. More challenged with SLS on left.                     PT Education - 12/30/20 1617     Education Details Discussed benefit of walker to make pt more independent versus having to hold on to husband all the time.    Person(s) Educated Patient;Spouse    Methods Explanation    Comprehension Verbalized understanding              PT Short Term Goals - 12/13/20 1656       PT SHORT TERM GOAL #1   Title Pt will be independent with initial HEP for strengthening and balance.    Time 4    Period Weeks    Status New    Target Date 01/05/21      PT SHORT TERM GOAL #2   Title Pt will decrease 5 x sit to stand from 14.5 sec to <12 sec for improved balance and functional strength.    Baseline 12/08/20 14.5 sec without AD    Time 4    Period Weeks    Status New    Target Date 01/05/21      PT SHORT TERM GOAL #3    Title Pt will ambulate >250' with LRAD supervision for improved household and short community distances.    Time 4    Period Weeks    Status New    Target Date 01/05/21      PT SHORT TERM GOAL #4   Title Berg balance will be assessed to further assess balance and LTG written.    Baseline 12/13/20 Berg performed with score of 31/56 and LTG written    Time 4    Period Weeks    Status Achieved    Target Date 01/05/21      PT SHORT TERM GOAL #5   Title Pt will be assessed for possible AFO need to see if could improve stability with gait.    Time 4    Period Weeks    Status New    Target Date 01/05/21               PT Long Term Goals - 12/13/20 1656       PT LONG TERM GOAL #1   Title Pt will verbalize understanding of fall prevention strategies and energy conservation. (LTGs due 02/02/21)    Time 8    Period Weeks    Status New      PT LONG TERM GOAL #2   Title Pt will improve Berg from 31 to >37/56 for improved balance and decrease fall risk.    Baseline 12/13/20 31/56    Time 8    Period Weeks    Status New      PT LONG TERM GOAL #3   Title Pt will increase gait speed from 0.2m/s to >0.19m/s for improved gait safety in community.    Baseline 12/08/20 0.50m/s    Time 8    Period Weeks    Status New      PT LONG TERM GOAL #4   Title Pt will ambulate > 400' on paved outdoor/various indoor surfaces with LRAD modified independent for improved functional mobility.    Time 8    Period Weeks    Status New  Plan - 12/30/20 1618     Clinical Impression Statement Pt able to increase gait distance with walker again today. She is steadier with use as has to touch things or hold husband without. Continued to work on slowing down for controlled movements. Pt challenged on rockerboard positioned lateral as loses balance to left often. When cued to engage core more it did help with her stability.    Personal Factors and Comorbidities Time since onset  of injury/illness/exacerbation    Examination-Activity Limitations Locomotion Level;Transfers;Stairs;Stand    Examination-Participation Restrictions Cleaning;Community Activity;Meal Prep    Stability/Clinical Decision Making Evolving/Moderate complexity    Rehab Potential Good    PT Frequency 2x / week   followed by 1x/week for 4 weeks. Plus eval   PT Duration 4 weeks    PT Treatment/Interventions ADLs/Self Care Home Management;DME Instruction;Cryotherapy;Gait training;Stair training;Functional mobility training;Therapeutic activities;Therapeutic exercise;Balance training;Neuromuscular re-education;Manual techniques;Orthotic Fit/Training;Patient/family education;Vestibular;Passive range of motion    PT Next Visit Plan STG check due next week. Continue gait training with RW to see if could be safe to use on own. Continue with strengthening and balance with engaging core. Monitor fatigue levels. Trial AFO to see if helps with stability if pt wears appropriate shoes?    Consulted and Agree with Plan of Care Patient             Patient will benefit from skilled therapeutic intervention in order to improve the following deficits and impairments:  Abnormal gait, Decreased endurance, Decreased balance, Decreased mobility, Decreased knowledge of use of DME, Decreased strength, Pain  Visit Diagnosis: Other abnormalities of gait and mobility  Muscle weakness (generalized)  Unsteadiness on feet     Problem List Patient Active Problem List   Diagnosis Date Noted   Relapsing remitting multiple sclerosis (HCC) 12/05/2020   Fatigue 12/05/2020   Spasticity 06/29/2015   ECG abnormality 06/23/2014   Migraine without aura and without status migrainosus, not intractable 06/16/2014   Nonspecific abnormal electrocardiogram (ECG) (EKG) 06/14/2014   Headache 10/28/2013   Multiple sclerosis (HCC) 08/23/2013   Gait difficulty 02/22/2013    Ronn Melena, PT, DPT, NCS 12/30/2020, 4:20 PM  [facility] [address] [place], [place], [address] Phone: [phone]   Fax:  [phone]  Name: Dana Duke MRN: [id number] Date of Birth: 31-Dec-1970

## 2021-01-03 ENCOUNTER — Ambulatory Visit: Payer: BC Managed Care – PPO

## 2021-01-03 ENCOUNTER — Other Ambulatory Visit: Payer: Self-pay

## 2021-01-03 ENCOUNTER — Telehealth: Payer: Self-pay | Admitting: Neurology

## 2021-01-03 DIAGNOSIS — R2689 Other abnormalities of gait and mobility: Secondary | ICD-10-CM | POA: Diagnosis not present

## 2021-01-03 DIAGNOSIS — G35 Multiple sclerosis: Secondary | ICD-10-CM

## 2021-01-03 DIAGNOSIS — H5711 Ocular pain, right eye: Secondary | ICD-10-CM | POA: Insufficient documentation

## 2021-01-03 DIAGNOSIS — M6281 Muscle weakness (generalized): Secondary | ICD-10-CM

## 2021-01-03 DIAGNOSIS — R2681 Unsteadiness on feet: Secondary | ICD-10-CM

## 2021-01-03 NOTE — Therapy (Signed)
[facility] [address] [place], [place], [address] Phone: [phone]   Fax:  [phone]  Physical Therapy Treatment  Patient Details  Name: Dana Duke MRN: [id number] Date of Birth: 1970-08-09 Referring Provider (PT): Marcial Pacas   Encounter Date: 01/03/2021   PT End of Session - 01/03/21 1452     Visit Number 6    Number of Visits 13    Date for PT Re-Evaluation 02/02/21    Authorization Type BCBS with 30 visit limit. (used 9 visits in pelvic PT at time of this PT eval and still in pelvic health PT)    PT Start Time 1450    PT Stop Time 1528    PT Time Calculation (min) 38 min    Equipment Utilized During Treatment Gait belt    Activity Tolerance Patient limited by fatigue    Behavior During Therapy WFL for tasks assessed/performed             Past Medical History:  Diagnosis Date   Ankle fracture, right    History of hysterectomy    Knee joint dislocation    bilateral   Migraines    Multiple sclerosis ([place])     Past Surgical History:  Procedure Laterality Date   ABDOMINAL HYSTERECTOMY     CESAREAN SECTION     MYOMECTOMY      There were no vitals filed for this visit.   Subjective Assessment - 01/03/21 1452     Subjective Pt is waiting to hear back from Dr. Krista Blue about the pain in right eye as the eye doctor has tried everything and inflammation has subsided with prednisone. Pt sees urologist tomorrow to decide if needs a stimulator. Pt wore sneakers today so could try AFO.    Patient Stated Goals Pt wants to be able to walk better with less fatigue and less pain.    Currently in Pain? Yes    Pain Score 4     Pain Location Leg    Pain Orientation Left    Pain Descriptors / Indicators Aching;Burning    Pain Type Chronic pain    Pain Onset More than a month ago    Pain Frequency Constant    Pain Onset More than a month ago                               [facility] Adult PT  Treatment/Exercise - 01/03/21 1456       Transfers   Transfers Sit to Stand;Stand to Sit    Sit to Stand 5: Supervision    Five time sit to stand comments  11.88 sec from chair without hands    Stand to Sit 5: Supervision      Ambulation/Gait   Ambulation/Gait Yes    Ambulation/Gait Assistance 5: Supervision;4: Min guard    Ambulation/Gait Assistance Details Pt ambulated multiple bouts to trial various AFOs: first bout without a brace or AD close SBA/CGA with pt having decreased left foot clearance and some ataxia, 2nd bout posterior ottobock walk on AFO on left. Pt reported some discomfort on medial side of ankle/foot from medial strut, 3rd bout left posterior [place] AFO that has lateral strut. Pt reported the foot feeling better but not liking how it was less flexible. 4th bout left foot-up brace with large cuff. Pt likes this feel the best. Helped with left foot clearance. No significant improvement was noted with stability with other  AFOs other than foot clearance. PT utilized left foot up brace for longer walking bouts close SBA/CGA with no AD and supervision with RW.  Pt was given cues to engage core to help with stability and slow down some.    Ambulation Distance (Feet) 115 Feet   x 4 with various braces, 345' without AD with foot up brace close, 230' with RW with foot up brace   Assistive device None;Rolling walker    Gait Pattern Step-through pattern;Decreased dorsiflexion - left;Ataxic    Ambulation Surface Level;Indoor                     PT Education - 01/03/21 1541     Education Details PT gave pt the information for foot up brace off [place] with large cuff.    Person(s) Educated Patient    Methods Explanation    Comprehension Verbalized understanding              PT Short Term Goals - 01/03/21 1541       PT SHORT TERM GOAL #1   Title Pt will be independent with initial HEP for strengthening and balance.    Time 4    Period Weeks    Status New     Target Date 01/05/21      PT SHORT TERM GOAL #2   Title Pt will decrease 5 x sit to stand from 14.5 sec to <12 sec for improved balance and functional strength.    Baseline 12/08/20 14.5 sec without AD. 01/03/21 11.88 sec from chair without arms    Time 4    Period Weeks    Status Achieved    Target Date 01/05/21      PT SHORT TERM GOAL #3   Title Pt will ambulate >250' with LRAD supervision for improved household and short community distances.    Baseline 01/03/21 Pt is supervision level with RW and supervision/CGA without AD on level surfaces.    Time 4    Period Weeks    Status Partially Met    Target Date 01/05/21      PT SHORT TERM GOAL #4   Title Berg balance will be assessed to further assess balance and LTG written.    Baseline 12/13/20 Berg performed with score of 31/56 and LTG written    Time 4    Period Weeks    Status Achieved    Target Date 01/05/21      PT SHORT TERM GOAL #5   Title Pt will be assessed for possible AFO need to see if could improve stability with gait.    Baseline 01/03/21 Pt was assessed for AFO today and left foot-up brace was what worked best/pt liked.    Time 4    Period Weeks    Status Achieved    Target Date 01/05/21               PT Long Term Goals - 12/13/20 1656       PT LONG TERM GOAL #1   Title Pt will verbalize understanding of fall prevention strategies and energy conservation. (LTGs due 02/02/21)    Time 8    Period Weeks    Status New      PT LONG TERM GOAL #2   Title Pt will improve Berg from 31 to >37/56 for improved balance and decrease fall risk.    Baseline 12/13/20 31/56    Time 8    Period Weeks    Status  New      PT LONG TERM GOAL #3   Title Pt will increase gait speed from 0.20ms to >0.845m for improved gait safety in community.    Baseline 12/08/20 0.7161m   Time 8    Period Weeks    Status New      PT LONG TERM GOAL #4   Title Pt will ambulate > 400' on paved outdoor/various indoor surfaces with LRAD  modified independent for improved functional mobility.    Time 8    Period Weeks    Status New                   Plan - 01/03/21 1546     Clinical Impression Statement PT started testing STGs today. Pt had worn sneakers so was able to trial AFOs. No significant improvement in stability with AFOs other than foot clearance so trialed left foot-up brace which seemed to work just as well and pt felt more comfortable in. Pt was given information for how to obtain online. Pt met 5 x sit to stand goal showing improving balance and functional strength. She had improved stability today with gait with cues to slow down and try to engage core. Also needed some cues to relax arms at sides. Pt continues to benefit from skilled PT to continue to work on strength, balance and functional mobility.    Personal Factors and Comorbidities Time since onset of injury/illness/exacerbation    Examination-Activity Limitations Locomotion Level;Transfers;Stairs;Stand    Examination-Participation Restrictions Cleaning;Community Activity;Meal Prep    Stability/Clinical Decision Making Evolving/Moderate complexity    Rehab Potential Good    PT Frequency 2x / week   followed by 1x/week for 4 weeks. Plus eval   PT Duration 4 weeks    PT Treatment/Interventions ADLs/Self Care Home Management;DME Instruction;Cryotherapy;Gait training;Stair training;Functional mobility training;Therapeutic activities;Therapeutic exercise;Balance training;Neuromuscular re-education;Manual techniques;Orthotic Fit/Training;Patient/family education;Vestibular;Passive range of motion    PT Next Visit Plan Finish checking remaining STGs. Continue gait training with RW and without with left foot-up brace. I gave pt information to purchase. Has she ordered? Continue with strengthening and balance with engaging core. Monitor fatigue levels.    Consulted and Agree with Plan of Care Patient             Patient will benefit from skilled  therapeutic intervention in order to improve the following deficits and impairments:  Abnormal gait, Decreased endurance, Decreased balance, Decreased mobility, Decreased knowledge of use of DME, Decreased strength, Pain  Visit Diagnosis: Other abnormalities of gait and mobility  Unsteadiness on feet  Muscle weakness (generalized)     Problem List Patient Active Problem List   Diagnosis Date Noted   Relapsing remitting multiple sclerosis ([place]/23/2022   Fatigue 12/05/2020   Spasticity 06/29/2015   ECG abnormality 06/23/2014   Migraine without aura and without status migrainosus, not intractable 06/16/2014   Nonspecific abnormal electrocardiogram (ECG) (EKG) 06/14/2014   Headache 10/28/2013   Multiple sclerosis ([place]/02/2014   Gait difficulty 02/22/2013    EmiElecta SniffT, DPT, NCS 01/03/2021, 3:51 PM  [id number]: [phone]:  [phone]: DesVallarie FeiN: [id number] of Birth: 6/608-23-1972

## 2021-01-03 NOTE — Telephone Encounter (Addendum)
I spoke to the patient. States she is having persistent right eye pain (some days it last longer than others). She is unsure where her swelling was located - just said she was told her "eye". Denies any vision changes or washout. She is agreeable to have MRI brain and see ophthalmology (records will be sent after eval).  She would like [place] to request her records from the optometrist. She is under the care of G. Roselyn Meier II, OD at [facility] (Ph: [phone]).   She also requested a new handicap placard application be completed and sent to her. This will be signed by Dr. Terrace Arabia and placed in the mail on 01/04/21.

## 2021-01-03 NOTE — Telephone Encounter (Signed)
Your last MRI of the brain was in November 2021,   I went ahead ordered repeat MRI of the brain with without contrast to evaluate if there are any changes,  I also referred her to ophthalmologist for more complete evaluation,( optician is expertise in making prescription for glasses),  Please also check what kind of inflammation of optician is talking about, please get the record, also let her bring the record for ophthalmology evaluation  Ask her if she has any visual change, washout with her painful right eye  Orders Placed This Encounter  Procedures   MR BRAIN W WO CONTRAST   Ambulatory referral to Ophthalmology

## 2021-01-04 ENCOUNTER — Telehealth: Payer: Self-pay | Admitting: Neurology

## 2021-01-04 NOTE — Telephone Encounter (Signed)
I spoke to the patient this morning. She contacted [facility] to request her records be sent here. She also wanted Dr. Terrace Arabia to know she is going for a consultation today to discuss possible placement of a bladder neurostimulator. Additionally, she would also like to have a MRI cervical spine completed at the the same time as her brain scan.

## 2021-01-04 NOTE — Telephone Encounter (Signed)
Orders Placed This Encounter  Procedures   MR BRAIN W WO CONTRAST   MR CERVICAL SPINE W WO CONTRAST   Ambulatory referral to Ophthalmology

## 2021-01-04 NOTE — Telephone Encounter (Signed)
Pt called to check on the MRI cervical spine being order. She is scheduled for her brain on 01/20/21 and wants to do both at the same time. We will check with Dr.Yan.

## 2021-01-04 NOTE — Telephone Encounter (Signed)
BCBS [place] Harvey: [id number] (exp. 01/04/21 to 02/02/21) order sent to [facility] because she has always gone to [facility].

## 2021-01-04 NOTE — Telephone Encounter (Signed)
Done

## 2021-01-04 NOTE — Telephone Encounter (Signed)
Pt is asking for a call from [place], [place] re: what was discuss on yesterday.  Please call.

## 2021-01-04 NOTE — Addendum Note (Signed)
Addended by: Levert Feinstein on: 01/04/2021 04:37 PM   Modules accepted: Orders

## 2021-01-05 ENCOUNTER — Ambulatory Visit: Payer: BC Managed Care – PPO | Admitting: Physical Therapy

## 2021-01-05 ENCOUNTER — Other Ambulatory Visit: Payer: Self-pay

## 2021-01-05 DIAGNOSIS — R2681 Unsteadiness on feet: Secondary | ICD-10-CM

## 2021-01-05 DIAGNOSIS — M6281 Muscle weakness (generalized): Secondary | ICD-10-CM

## 2021-01-05 DIAGNOSIS — R2689 Other abnormalities of gait and mobility: Secondary | ICD-10-CM

## 2021-01-05 NOTE — Patient Instructions (Addendum)
Access Code: [email or web address] URL: [email or web address] Date: 01/05/2021 Prepared by: Bufford Lope  Exercises Sit to stand with Left Leg back (Mirrored) - 1 x daily - 7 x weekly - 2 sets - 5 reps Clamshell - 1 x daily - 5 x weekly - 1 sets - 10 reps Sidelying Reverse Clamshell - 1 x daily - 7 x weekly - 1 sets - 10 reps Supine Bridge with Mini Swiss Ball Between Knees - 1 x daily - 7 x weekly - 2 sets - 5 reps CLX Ankle Dorsiflexion and Eversion - 1 x daily - 7 x weekly - 1 sets - 10 reps

## 2021-01-05 NOTE — Therapy (Signed)
[facility] [address] [place], [place], [address] Phone: [phone]   Fax:  [phone]  Physical Therapy Treatment  Patient Details  Name: Dana Duke MRN: [id number] Date of Birth: 12-23-1970 Referring Provider (PT): Marcial Pacas   Encounter Date: 01/05/2021   PT End of Session - 01/05/21 1453     Visit Number 7    Number of Visits 13    Date for PT Re-Evaluation 02/02/21    Authorization Type BCBS with 30 visit limit. (used 9 visits in pelvic PT at time of this PT eval and still in pelvic health PT)    PT Start Time 1450    PT Stop Time 1530    PT Time Calculation (min) 40 min    Activity Tolerance Patient limited by fatigue    Behavior During Therapy WFL for tasks assessed/performed             Past Medical History:  Diagnosis Date   Ankle fracture, right    History of hysterectomy    Knee joint dislocation    bilateral   Migraines    Multiple sclerosis ([place])     Past Surgical History:  Procedure Laterality Date   ABDOMINAL HYSTERECTOMY     CESAREAN SECTION     MYOMECTOMY      There were no vitals filed for this visit.   Subjective Assessment - 01/05/21 1548     Subjective Will be going forward with sacral neuromodulation for incontinence.  Has ordered foot up brace, should arrive today.    Patient is accompained by: Family member    Patient Stated Goals Pt wants to be able to walk better with less fatigue and less pain.    Currently in Pain? No/denies    Pain Onset More than a month ago    Pain Onset More than a month ago               [facility] Adult PT Treatment/Exercise - 01/05/21 1645       Exercises   Exercises Other Exercises    Other Exercises  Reviewed patient's current HEP.  Due to progress updated and progressed exercises.  Changed sit > stand to staggered stance with L foot back.  No change to clamshells but added reverse clamshell for hip IR strengthening.  For bridges  added pillow squeeze for increased stabilization.  Added seated ankle DF and eversion against resistance of green theraband.  Pt return demonstrated each exercise with husband observing.                 Balance Exercises - 01/05/21 0001       Balance Exercises: Standing   Tandem Stance Eyes open;4 reps;10 secs;Limitations    Tandem Stance Time without UE support, min-mod A at pelvis from therapist for balance and stabilization    Tandem Gait Forward;Intermittent upper extremity support;5 reps;Limitations    Tandem Gait Limitations began with UE support in // bars progressing to no UE support but therapist providing min A at pelvis for balance and stabilization; cues to slow down gait to facilitate balance reactions.    Sidestepping Upper extremity support;4 reps;Limitations;Theraband    Theraband Level (Sidestepping) Level 3 (Green)    Sidestepping Limitations in // bars for UE support; cued to maintain slight squat when side stepping L and R.  Provided tactile cues for weight shifting.                PT Education - 01/05/21 1551  Education Details updated HEP    Person(s) Educated Patient;Spouse    Methods Explanation;Demonstration;Handout    Comprehension Verbalized understanding;Returned demonstration              PT Short Term Goals - 01/05/21 1541       PT SHORT TERM GOAL #1   Title Pt will be independent with initial HEP for strengthening and balance.    Time 4    Period Weeks    Status Achieved    Target Date 01/05/21      PT SHORT TERM GOAL #2   Title Pt will decrease 5 x sit to stand from 14.5 sec to <12 sec for improved balance and functional strength.    Baseline 12/08/20 14.5 sec without AD. 01/03/21 11.88 sec from chair without arms    Time 4    Period Weeks    Status Achieved    Target Date 01/05/21      PT SHORT TERM GOAL #3   Title Pt will ambulate >250' with LRAD supervision for improved household and short community distances.     Baseline 01/03/21 Pt is supervision level with RW and supervision/CGA without AD on level surfaces.    Time 4    Period Weeks    Status Partially Met    Target Date 01/05/21      PT SHORT TERM GOAL #4   Title Berg balance will be assessed to further assess balance and LTG written.    Baseline 12/13/20 Berg performed with score of 31/56 and LTG written    Time 4    Period Weeks    Status Achieved    Target Date 01/05/21      PT SHORT TERM GOAL #5   Title Pt will be assessed for possible AFO need to see if could improve stability with gait.    Baseline 01/03/21 Pt was assessed for AFO today and left foot-up brace was what worked best/pt liked.    Time 4    Period Weeks    Status Achieved    Target Date 01/05/21               PT Long Term Goals - 12/13/20 1656       PT LONG TERM GOAL #1   Title Pt will verbalize understanding of fall prevention strategies and energy conservation. (LTGs due 02/02/21)    Time 8    Period Weeks    Status New      PT LONG TERM GOAL #2   Title Pt will improve Berg from 31 to >37/56 for improved balance and decrease fall risk.    Baseline 12/13/20 31/56    Time 8    Period Weeks    Status New      PT LONG TERM GOAL #3   Title Pt will increase gait speed from 0.39ms to >0.862m for improved gait safety in community.    Baseline 12/08/20 0.7129m   Time 8    Period Weeks    Status New      PT LONG TERM GOAL #4   Title Pt will ambulate > 400' on paved outdoor/various indoor surfaces with LRAD modified independent for improved functional mobility.    Time 8    Period Weeks    Status New                   Plan - 01/05/21 1541     Clinical Impression Statement Completed assessment of progress towards STG with  review of current HEP.  Due to improvements in LE strength, core strength and balance pt was able to tolerate upgrade of current exercises and addition of two more.  Performed dynamic standing balance training with and without  UE support and pt demonstrated improvement in ability to ambulate without external support today.  Continues to exhibit core weakness and proximal instability but pt is making good progress towards goals and has met 4/5 STG.    Personal Factors and Comorbidities Time since onset of injury/illness/exacerbation    Examination-Activity Limitations Locomotion Level;Transfers;Stairs;Stand    Examination-Participation Restrictions Cleaning;Community Activity;Meal Prep    Stability/Clinical Decision Making Evolving/Moderate complexity    Rehab Potential Good    PT Frequency 2x / week   followed by 1x/week for 4 weeks. Plus eval   PT Duration 4 weeks    PT Treatment/Interventions ADLs/Self Care Home Management;DME Instruction;Cryotherapy;Gait training;Stair training;Functional mobility training;Therapeutic activities;Therapeutic exercise;Balance training;Neuromuscular re-education;Manual techniques;Orthotic Fit/Training;Patient/family education;Vestibular;Passive range of motion    PT Next Visit Plan How is she tolerating new exercises?  Continue gait training with (or without??) RW and with left foot-up brace. Continue with strengthening and balance with engaging core. Monitor fatigue levels.    Consulted and Agree with Plan of Care Patient             Patient will benefit from skilled therapeutic intervention in order to improve the following deficits and impairments:  Abnormal gait, Decreased endurance, Decreased balance, Decreased mobility, Decreased knowledge of use of DME, Decreased strength, Pain  Visit Diagnosis: Other abnormalities of gait and mobility  Unsteadiness on feet  Muscle weakness (generalized)     Problem List Patient Active Problem List   Diagnosis Date Noted   Eye pain, right 01/03/2021   Relapsing remitting multiple sclerosis ([place]) 12/05/2020   Fatigue 12/05/2020   Spasticity 06/29/2015   ECG abnormality 06/23/2014   Migraine without aura and without status  migrainosus, not intractable 06/16/2014   Nonspecific abnormal electrocardiogram (ECG) (EKG) 06/14/2014   Headache 10/28/2013   Multiple sclerosis ([place]) 08/23/2013   Gait difficulty 02/22/2013    Rico Junker, PT, DPT 01/05/21    4:52 PM    [facility] [address] [place], [place], [address] Phone: [phone]   Fax:  [phone]  Name: Dana Duke MRN: [id number] Date of Birth: October 26, 1970

## 2021-01-08 NOTE — Telephone Encounter (Signed)
Referral sent to [facility]. Phone: [phone].

## 2021-01-08 NOTE — Telephone Encounter (Signed)
MRI Cervical spine auth: [id number] (exp. 01/08/21 to 02/06/21) order sent to [facility]. They will reach out to the patient to schedule.

## 2021-01-09 ENCOUNTER — Other Ambulatory Visit: Payer: Self-pay

## 2021-01-09 ENCOUNTER — Ambulatory Visit: Payer: BC Managed Care – PPO

## 2021-01-09 DIAGNOSIS — R2689 Other abnormalities of gait and mobility: Secondary | ICD-10-CM | POA: Diagnosis not present

## 2021-01-09 DIAGNOSIS — R2681 Unsteadiness on feet: Secondary | ICD-10-CM

## 2021-01-09 DIAGNOSIS — M6281 Muscle weakness (generalized): Secondary | ICD-10-CM

## 2021-01-09 NOTE — Therapy (Signed)
[facility] [address] [place], [place], [address] Phone: [phone]   Fax:  [phone]  Physical Therapy Treatment  Patient Details  Name: Dana Duke MRN: [id number] Date of Birth: 01-20-1971 Referring Provider (PT): Marcial Pacas   Encounter Date: 01/09/2021   PT End of Session - 01/09/21 1450     Visit Number 8    Number of Visits 13    Date for PT Re-Evaluation 02/02/21    Authorization Type BCBS with 30 visit limit. (used 9 visits in pelvic PT at time of this PT eval and still in pelvic health PT)    PT Start Time 1448    PT Stop Time 1526    PT Time Calculation (min) 38 min    Equipment Utilized During Treatment Gait belt    Activity Tolerance Patient limited by fatigue;Patient tolerated treatment well    Behavior During Therapy WFL for tasks assessed/performed             Past Medical History:  Diagnosis Date   Ankle fracture, right    History of hysterectomy    Knee joint dislocation    bilateral   Migraines    Multiple sclerosis ([place])     Past Surgical History:  Procedure Laterality Date   ABDOMINAL HYSTERECTOMY     CESAREAN SECTION     MYOMECTOMY      There were no vitals filed for this visit.   Subjective Assessment - 01/09/21 1451     Subjective Pt reports that she is tired today as was going up and down steps to get things for her daughter. She did get the foot-up brace and asked PT to be sure it was donned correctly.    Patient is accompained by: Family member    Patient Stated Goals Pt wants to be able to walk better with less fatigue and less pain.    Currently in Pain? Yes    Pain Score 5     Pain Location Leg    Pain Orientation Left    Pain Descriptors / Indicators Aching;Burning    Pain Type Chronic pain    Pain Onset More than a month ago    Pain Frequency Constant    Pain Onset More than a month ago                               [facility] Adult PT  Treatment/Exercise - 01/09/21 1457       Ambulation/Gait   Ambulation/Gait Yes    Ambulation/Gait Assistance 5: Supervision    Ambulation/Gait Assistance Details PT adjusted foot-up brace in left shoe for pt. Cued to slow down and engage core for more stability.    Ambulation Distance (Feet) 345 Feet    Assistive device None    Gait Pattern Step-through pattern;Narrow base of support;Ataxic    Ambulation Surface Level;Indoor      Neuro Re-ed    Neuro Re-ed Details  Seated on blue physioball: alternating shoulder flexionx 10 with cues to engage core, marching x 10, LAQ x 10, ant/post pelvic tilts x 10, sit to stand x 10 without UE support. In supine bridges with legs on blue physioball x 10 then bridge with hamstring curl x 10 with verbal cues for form. In // bars: on airex feet apart eyes open x 30 sec, reaching across for targets x 10 each side for trunk rotation, feet together x 30 sec eyes  open, eyes closed with CGA/min assist with eyes closee. On rockerboard positioned ant/post: trying to maintain level CGA with occasional LOB posterior 30 sec x 2 with improvement as went on. Pt was cued to pull down on toes and forward with hips to help. Repositioned board lateral and maintained level x 30 sec x 2 CGA/min assist at times with LOB to left x 2.  Wall bumps working on pulling away with anterior tip and core x 10 with verbal cues for form.                       PT Short Term Goals - 01/05/21 1541       PT SHORT TERM GOAL #1   Title Pt will be independent with initial HEP for strengthening and balance.    Time 4    Period Weeks    Status Achieved    Target Date 01/05/21      PT SHORT TERM GOAL #2   Title Pt will decrease 5 x sit to stand from 14.5 sec to <12 sec for improved balance and functional strength.    Baseline 12/08/20 14.5 sec without AD. 01/03/21 11.88 sec from chair without arms    Time 4    Period Weeks    Status Achieved    Target Date 01/05/21      PT  SHORT TERM GOAL #3   Title Pt will ambulate >250' with LRAD supervision for improved household and short community distances.    Baseline 01/03/21 Pt is supervision level with RW and supervision/CGA without AD on level surfaces.    Time 4    Period Weeks    Status Partially Met    Target Date 01/05/21      PT SHORT TERM GOAL #4   Title Berg balance will be assessed to further assess balance and LTG written.    Baseline 12/13/20 Berg performed with score of 31/56 and LTG written    Time 4    Period Weeks    Status Achieved    Target Date 01/05/21      PT SHORT TERM GOAL #5   Title Pt will be assessed for possible AFO need to see if could improve stability with gait.    Baseline 01/03/21 Pt was assessed for AFO today and left foot-up brace was what worked best/pt liked.    Time 4    Period Weeks    Status Achieved    Target Date 01/05/21               PT Long Term Goals - 12/13/20 1656       PT LONG TERM GOAL #1   Title Pt will verbalize understanding of fall prevention strategies and energy conservation. (LTGs due 02/02/21)    Time 8    Period Weeks    Status New      PT LONG TERM GOAL #2   Title Pt will improve Berg from 31 to >37/56 for improved balance and decrease fall risk.    Baseline 12/13/20 31/56    Time 8    Period Weeks    Status New      PT LONG TERM GOAL #3   Title Pt will increase gait speed from 0.79ms to >0.873m for improved gait safety in community.    Baseline 12/08/20 0.7131m   Time 8    Period Weeks    Status New      PT LONG TERM GOAL #4  Title Pt will ambulate > 400' on paved outdoor/various indoor surfaces with LRAD modified independent for improved functional mobility.    Time 8    Period Weeks    Status New                   Plan - 01/09/21 1537     Clinical Impression Statement Pt purchased left foot up brace so PT adjusted it for her today.Continued to focus on core stabilization to help more with control with  activities.    Personal Factors and Comorbidities Time since onset of injury/illness/exacerbation    Examination-Activity Limitations Locomotion Level;Transfers;Stairs;Stand    Examination-Participation Restrictions Cleaning;Community Activity;Meal Prep    Stability/Clinical Decision Making Evolving/Moderate complexity    Rehab Potential Good    PT Frequency 2x / week   followed by 1x/week for 4 weeks. Plus eval   PT Duration 4 weeks    PT Treatment/Interventions ADLs/Self Care Home Management;DME Instruction;Cryotherapy;Gait training;Stair training;Functional mobility training;Therapeutic activities;Therapeutic exercise;Balance training;Neuromuscular re-education;Manual techniques;Orthotic Fit/Training;Patient/family education;Vestibular;Passive range of motion    PT Next Visit Plan Continue gait training with (or without??) RW and with left foot-up brace. Continue with strengthening and balance with engaging core. Monitor fatigue levels.    Consulted and Agree with Plan of Care Patient             Patient will benefit from skilled therapeutic intervention in order to improve the following deficits and impairments:  Abnormal gait, Decreased endurance, Decreased balance, Decreased mobility, Decreased knowledge of use of DME, Decreased strength, Pain  Visit Diagnosis: Other abnormalities of gait and mobility  Muscle weakness (generalized)  Unsteadiness on feet     Problem List Patient Active Problem List   Diagnosis Date Noted   Eye pain, right 01/03/2021   Relapsing remitting multiple sclerosis ([place]) 12/05/2020   Fatigue 12/05/2020   Spasticity 06/29/2015   ECG abnormality 06/23/2014   Migraine without aura and without status migrainosus, not intractable 06/16/2014   Nonspecific abnormal electrocardiogram (ECG) (EKG) 06/14/2014   Headache 10/28/2013   Multiple sclerosis ([place]) 08/23/2013   Gait difficulty 02/22/2013    Electa Sniff, PT, DPT, NCS 01/09/2021, 3:39  PM  [facility] [address] [place], [place], [address] Phone: [phone]   Fax:  [phone]  Name: Dana Duke MRN: [id number] Date of Birth: 1970-10-18

## 2021-01-16 ENCOUNTER — Ambulatory Visit: Payer: BC Managed Care – PPO | Attending: Neurology

## 2021-01-16 ENCOUNTER — Other Ambulatory Visit: Payer: Self-pay

## 2021-01-16 DIAGNOSIS — R2689 Other abnormalities of gait and mobility: Secondary | ICD-10-CM | POA: Diagnosis not present

## 2021-01-16 DIAGNOSIS — M6281 Muscle weakness (generalized): Secondary | ICD-10-CM | POA: Insufficient documentation

## 2021-01-16 DIAGNOSIS — R2681 Unsteadiness on feet: Secondary | ICD-10-CM | POA: Insufficient documentation

## 2021-01-16 NOTE — Patient Instructions (Signed)
Access Code: [email or web address] URL: [email or web address] Date: 01/16/2021 Prepared by: Elmer Bales  Exercises Sit to stand with Left Leg back (Mirrored) - 1 x daily - 7 x weekly - 2 sets - 5 reps Clamshell - 1 x daily - 5 x weekly - 1 sets - 10 reps Sidelying Reverse Clamshell - 1 x daily - 7 x weekly - 1 sets - 10 reps Supine Bridge with Mini Swiss Ball Between Knees - 1 x daily - 7 x weekly - 2 sets - 5 reps CLX Ankle Dorsiflexion and Eversion - 1 x daily - 7 x weekly - 1 sets - 10 reps Romberg Stance - 2 x daily - 7 x weekly - 2 sets - 10 reps Standing Thoracic and Cervical Rotation with Reach at Wall - 2 x daily - 7 x weekly - 2 sets - 10 reps Standing Forward Step Taps with Counter Support - 2 x daily - 7 x weekly - 2 sets - 10 reps

## 2021-01-16 NOTE — Therapy (Signed)
[facility] [address] [place], [place], [address] Phone: [phone]   Fax:  [phone]  Physical Therapy Treatment  Patient Details  Name: Dana Duke MRN: [id number] Date of Birth: 1971-02-07 Referring Provider (PT): Marcial Pacas   Encounter Date: 01/16/2021   PT End of Session - 01/16/21 1451     Visit Number 9    Number of Visits 13    Date for PT Re-Evaluation 02/02/21    Authorization Type BCBS with 30 visit limit. (used 9 visits in pelvic PT at time of this PT eval and still in pelvic health PT)    PT Start Time 1448    PT Stop Time 1530    PT Time Calculation (min) 42 min    Equipment Utilized During Treatment Gait belt    Activity Tolerance Patient limited by fatigue;Patient tolerated treatment well    Behavior During Therapy WFL for tasks assessed/performed             Past Medical History:  Diagnosis Date   Ankle fracture, right    History of hysterectomy    Knee joint dislocation    bilateral   Migraines    Multiple sclerosis ([place])     Past Surgical History:  Procedure Laterality Date   ABDOMINAL HYSTERECTOMY     CESAREAN SECTION     MYOMECTOMY      There were no vitals filed for this visit.   Subjective Assessment - 01/16/21 1451     Subjective Pt reports she is doing pretty good. Energy level has been better. Still has the left knee pain.    Patient is accompained by: --    Patient Stated Goals Pt wants to be able to walk better with less fatigue and less pain.    Currently in Pain? Yes    Pain Score 4     Pain Location Knee    Pain Orientation Left    Pain Descriptors / Indicators Aching;Burning    Pain Onset More than a month ago    Pain Frequency Constant    Pain Onset More than a month ago                               [facility] Adult PT Treatment/Exercise - 01/16/21 1452       Ambulation/Gait   Ambulation/Gait Yes    Ambulation/Gait Assistance 5:  Supervision    Ambulation Distance (Feet) 460 Feet    Assistive device None   foot up brace left   Gait Pattern Step-through pattern;Ataxic    Ambulation Surface Level;Indoor      Neuro Re-ed    Neuro Re-ed Details  Corner balance exercises: alternating shoulder flexion x 10 each UE with cues to engage core, reaching across body to wall on other side x 10 each side, tapping cone with fingertip support with verbal cues to engage core and control feet.      Exercises   Exercises Other Exercises;Knee/Hip      Knee/Hip Exercises: Aerobic   Other Aerobic SciFit x 6 min level 6 with BUE and BLE. Pt denied any increased pain in knee. RPE=2/10 after as far as fatigue                     PT Education - 01/16/21 2007     Education Details Added standing balance/core activation activities to [employer]) Educated Patient  Methods Explanation;Demonstration;Handout    Comprehension Verbalized understanding              PT Short Term Goals - 01/05/21 1541       PT SHORT TERM GOAL #1   Title Pt will be independent with initial HEP for strengthening and balance.    Time 4    Period Weeks    Status Achieved    Target Date 01/05/21      PT SHORT TERM GOAL #2   Title Pt will decrease 5 x sit to stand from 14.5 sec to <12 sec for improved balance and functional strength.    Baseline 12/08/20 14.5 sec without AD. 01/03/21 11.88 sec from chair without arms    Time 4    Period Weeks    Status Achieved    Target Date 01/05/21      PT SHORT TERM GOAL #3   Title Pt will ambulate >250' with LRAD supervision for improved household and short community distances.    Baseline 01/03/21 Pt is supervision level with RW and supervision/CGA without AD on level surfaces.    Time 4    Period Weeks    Status Partially Met    Target Date 01/05/21      PT SHORT TERM GOAL #4   Title Berg balance will be assessed to further assess balance and LTG written.    Baseline 12/13/20 Berg  performed with score of 31/56 and LTG written    Time 4    Period Weeks    Status Achieved    Target Date 01/05/21      PT SHORT TERM GOAL #5   Title Pt will be assessed for possible AFO need to see if could improve stability with gait.    Baseline 01/03/21 Pt was assessed for AFO today and left foot-up brace was what worked best/pt liked.    Time 4    Period Weeks    Status Achieved    Target Date 01/05/21               PT Long Term Goals - 12/13/20 1656       PT LONG TERM GOAL #1   Title Pt will verbalize understanding of fall prevention strategies and energy conservation. (LTGs due 02/02/21)    Time 8    Period Weeks    Status New      PT LONG TERM GOAL #2   Title Pt will improve Berg from 31 to >37/56 for improved balance and decrease fall risk.    Baseline 12/13/20 31/56    Time 8    Period Weeks    Status New      PT LONG TERM GOAL #3   Title Pt will increase gait speed from 0.38ms to >0.858m for improved gait safety in community.    Baseline 12/08/20 0.7147m   Time 8    Period Weeks    Status New      PT LONG TERM GOAL #4   Title Pt will ambulate > 400' on paved outdoor/various indoor surfaces with LRAD modified independent for improved functional mobility.    Time 8    Period Weeks    Status New                   Plan - 01/16/21 2007     Clinical Impression Statement Pt continues to report improvement in energy level. Was able to increase gait distance with only left foot up brace with  less staggering.    Personal Factors and Comorbidities Time since onset of injury/illness/exacerbation    Examination-Activity Limitations Locomotion Level;Transfers;Stairs;Stand    Examination-Participation Restrictions Cleaning;Community Activity;Meal Prep    Stability/Clinical Decision Making Evolving/Moderate complexity    Rehab Potential Good    PT Frequency 2x / week   followed by 1x/week for 4 weeks. Plus eval   PT Duration 4 weeks    PT  Treatment/Interventions ADLs/Self Care Home Management;DME Instruction;Cryotherapy;Gait training;Stair training;Functional mobility training;Therapeutic activities;Therapeutic exercise;Balance training;Neuromuscular re-education;Manual techniques;Orthotic Fit/Training;Patient/family education;Vestibular;Passive range of motion    PT Next Visit Plan Continue gait training with (or without??) RW and with left foot-up brace. Continue with strengthening and balance with engaging core. Monitor fatigue levels.    Consulted and Agree with Plan of Care Patient             Patient will benefit from skilled therapeutic intervention in order to improve the following deficits and impairments:  Abnormal gait, Decreased endurance, Decreased balance, Decreased mobility, Decreased knowledge of use of DME, Decreased strength, Pain  Visit Diagnosis: Other abnormalities of gait and mobility  Muscle weakness (generalized)  Unsteadiness on feet     Problem List Patient Active Problem List   Diagnosis Date Noted   Eye pain, right 01/03/2021   Relapsing remitting multiple sclerosis ([place]) 12/05/2020   Fatigue 12/05/2020   Spasticity 06/29/2015   ECG abnormality 06/23/2014   Migraine without aura and without status migrainosus, not intractable 06/16/2014   Nonspecific abnormal electrocardiogram (ECG) (EKG) 06/14/2014   Headache 10/28/2013   Multiple sclerosis ([place]) 08/23/2013   Gait difficulty 02/22/2013    Electa Sniff, PT, DPT, NCS 01/16/2021, 8:09 PM  [facility] [address] [place], [place], [address] Phone: [phone]   Fax:  [phone]  Name: Dana Duke MRN: [id number] Date of Birth: Dec 06, 1970

## 2021-01-19 ENCOUNTER — Other Ambulatory Visit: Payer: BC Managed Care – PPO

## 2021-01-21 ENCOUNTER — Other Ambulatory Visit: Payer: Self-pay

## 2021-01-21 ENCOUNTER — Ambulatory Visit
Admission: RE | Admit: 2021-01-21 | Discharge: 2021-01-21 | Disposition: A | Discharge status: Home / Self Care | Payer: BC Managed Care – PPO | Source: Ambulatory Visit | Attending: Neurology | Admitting: Neurology

## 2021-01-21 DIAGNOSIS — G35 Multiple sclerosis: Secondary | ICD-10-CM

## 2021-01-21 MED ORDER — GADOBENATE DIMEGLUMINE 529 MG/ML IV SOLN
18.0000 mL | Freq: Once | INTRAVENOUS | Status: AC | PRN
  Administered 2021-01-21: 18 mL via INTRAVENOUS

## 2021-01-23 ENCOUNTER — Other Ambulatory Visit: Payer: Self-pay

## 2021-01-23 ENCOUNTER — Ambulatory Visit: Payer: BC Managed Care – PPO

## 2021-01-23 DIAGNOSIS — M6281 Muscle weakness (generalized): Secondary | ICD-10-CM

## 2021-01-23 DIAGNOSIS — R2689 Other abnormalities of gait and mobility: Secondary | ICD-10-CM

## 2021-01-23 DIAGNOSIS — R2681 Unsteadiness on feet: Secondary | ICD-10-CM

## 2021-01-23 NOTE — Therapy (Signed)
[facility] [address] [place], [place], [address] Phone: [phone]   Fax:  [phone]  Physical Therapy Treatment  Patient Details  Name: Dana Duke MRN: [id number] Date of Birth: Aug 28, 1970 Referring Provider (PT): Marcial Pacas   Encounter Date: 01/23/2021   PT End of Session - 01/23/21 1404     Visit Number 10    Number of Visits 13    Date for PT Re-Evaluation 02/02/21    Authorization Type BCBS with 30 visit limit. (used 9 visits in pelvic PT at time of this PT eval and still in pelvic health PT)    PT Start Time 1401    PT Stop Time 1443    PT Time Calculation (min) 42 min    Equipment Utilized During Treatment Gait belt    Activity Tolerance Patient limited by fatigue;Patient tolerated treatment well    Behavior During Therapy WFL for tasks assessed/performed             Past Medical History:  Diagnosis Date   Ankle fracture, right    History of hysterectomy    Knee joint dislocation    bilateral   Migraines    Multiple sclerosis ([place])     Past Surgical History:  Procedure Laterality Date   ABDOMINAL HYSTERECTOMY     CESAREAN SECTION     MYOMECTOMY      There were no vitals filed for this visit.   Subjective Assessment - 01/23/21 1404     Subjective Pt reports that she is doing well. Her daughter was visiting this weekend and she went for a walk down the block and back with her and made it all the way before getting tired just as she got back. Had not been able to do that for some time. Her optometrist appointment went well and they did not see any issues.    Patient Stated Goals Pt wants to be able to walk better with less fatigue and less pain.    Currently in Pain? Yes    Pain Score 4     Pain Location Knee    Pain Orientation Left    Pain Descriptors / Indicators Aching;Burning    Pain Type Chronic pain    Pain Onset More than a month ago    Pain Frequency Constant    Pain  Onset More than a month ago                               [facility] Adult PT Treatment/Exercise - 01/23/21 1410       Ambulation/Gait   Ambulation/Gait Yes    Ambulation/Gait Assistance 5: Supervision    Ambulation/Gait Assistance Details Verbal cues to engage core with walking and focus on foot placement. Pt was also cued to try to relax arms. Pt denied fatigue after walk.    Ambulation Distance (Feet) 690 Feet    Assistive device None   left foot up brace   Gait Pattern Step-through pattern;Ataxic    Ambulation Surface Level;Indoor    Gait velocity 10.52 sec=0.41ms      Neuro Re-ed    Neuro Re-ed Details  In // bars: standing on rockerboard positioned lateral trying to maintain level x 30 sec eyes open, 30 sec eyes closed but needed min assist at times due to losing balance to left multiple times. Rocking board side to side x 10. UE diagonals with 2.2# med ball x 10  each side trying to maintain board level. Turned board to ant/post position and repeated maintaining level x 30 sec eyes open then eyes closed x 30 sec CGA/min assist, raising 2.2# med ball up/down x 10. Cues to engage core throughout.                     PT Education - 01/23/21 2033     Education Details Discussed discharge plan for next visit.    Person(s) Educated Patient    Methods Explanation    Comprehension Verbalized understanding              PT Short Term Goals - 01/05/21 1541       PT SHORT TERM GOAL #1   Title Pt will be independent with initial HEP for strengthening and balance.    Time 4    Period Weeks    Status Achieved    Target Date 01/05/21      PT SHORT TERM GOAL #2   Title Pt will decrease 5 x sit to stand from 14.5 sec to <12 sec for improved balance and functional strength.    Baseline 12/08/20 14.5 sec without AD. 01/03/21 11.88 sec from chair without arms    Time 4    Period Weeks    Status Achieved    Target Date 01/05/21      PT SHORT TERM GOAL #3    Title Pt will ambulate >250' with LRAD supervision for improved household and short community distances.    Baseline 01/03/21 Pt is supervision level with RW and supervision/CGA without AD on level surfaces.    Time 4    Period Weeks    Status Partially Met    Target Date 01/05/21      PT SHORT TERM GOAL #4   Title Berg balance will be assessed to further assess balance and LTG written.    Baseline 12/13/20 Berg performed with score of 31/56 and LTG written    Time 4    Period Weeks    Status Achieved    Target Date 01/05/21      PT SHORT TERM GOAL #5   Title Pt will be assessed for possible AFO need to see if could improve stability with gait.    Baseline 01/03/21 Pt was assessed for AFO today and left foot-up brace was what worked best/pt liked.    Time 4    Period Weeks    Status Achieved    Target Date 01/05/21               PT Long Term Goals - 01/23/21 2033       PT LONG TERM GOAL #1   Title Pt will verbalize understanding of fall prevention strategies and energy conservation. (LTGs due 02/02/21)    Time 8    Period Weeks    Status New      PT LONG TERM GOAL #2   Title Pt will improve Berg from 31 to >37/56 for improved balance and decrease fall risk.    Baseline 12/13/20 31/56    Time 8    Period Weeks    Status New      PT LONG TERM GOAL #3   Title Pt will increase gait speed from 0.46ms to >0.871m for improved gait safety in community.    Baseline 12/08/20 0.7114m 01/23/21 0.52m24m  Time 8    Period Weeks    Status Achieved      PT LONG  TERM GOAL #4   Title Pt will ambulate > 400' on paved outdoor/various indoor surfaces with LRAD modified independent for improved functional mobility.    Time 8    Period Weeks    Status New                   Plan - 01/23/21 2034     Clinical Impression Statement Pt continues to show improved stability with gait. Although she has been working on slowing down to be more controlled her gait speed has  actually increased meeting goal as she is more controlled with larger steps. PT plans to assess remaining goals next visit for discharge.    Personal Factors and Comorbidities Time since onset of injury/illness/exacerbation    Examination-Activity Limitations Locomotion Level;Transfers;Stairs;Stand    Examination-Participation Restrictions Cleaning;Community Activity;Meal Prep    Stability/Clinical Decision Making Evolving/Moderate complexity    Rehab Potential Good    PT Frequency 2x / week   followed by 1x/week for 4 weeks. Plus eval   PT Duration 4 weeks    PT Treatment/Interventions ADLs/Self Care Home Management;DME Instruction;Cryotherapy;Gait training;Stair training;Functional mobility training;Therapeutic activities;Therapeutic exercise;Balance training;Neuromuscular re-education;Manual techniques;Orthotic Fit/Training;Patient/family education;Vestibular;Passive range of motion    PT Next Visit Plan Assess remaining goals for discharge next visit.    Consulted and Agree with Plan of Care Patient             Patient will benefit from skilled therapeutic intervention in order to improve the following deficits and impairments:  Abnormal gait, Decreased endurance, Decreased balance, Decreased mobility, Decreased knowledge of use of DME, Decreased strength, Pain  Visit Diagnosis: Other abnormalities of gait and mobility  Muscle weakness (generalized)  Unsteadiness on feet     Problem List Patient Active Problem List   Diagnosis Date Noted   Eye pain, right 01/03/2021   Relapsing remitting multiple sclerosis ([place]) 12/05/2020   Fatigue 12/05/2020   Spasticity 06/29/2015   ECG abnormality 06/23/2014   Migraine without aura and without status migrainosus, not intractable 06/16/2014   Nonspecific abnormal electrocardiogram (ECG) (EKG) 06/14/2014   Headache 10/28/2013   Multiple sclerosis ([place]) 08/23/2013   Gait difficulty 02/22/2013    Electa Sniff, PT, DPT,  NCS 01/23/2021, 8:37 PM  [facility] [address] [place], [place], [address] Phone: [phone]   Fax:  [phone]  Name: Dana Duke MRN: [id number] Date of Birth: 16-Mar-1971

## 2021-01-25 ENCOUNTER — Ambulatory Visit: Payer: BC Managed Care – PPO | Admitting: Neurology

## 2021-01-25 ENCOUNTER — Other Ambulatory Visit: Payer: Self-pay | Admitting: Neurology

## 2021-01-25 ENCOUNTER — Other Ambulatory Visit: Payer: Self-pay | Admitting: *Deleted

## 2021-01-25 MED ORDER — AJOVY 225 MG/1.5ML ~~LOC~~ SOAJ: 225.0000 mg | SUBCUTANEOUS | 3 refills | Status: DC

## 2021-01-25 MED ORDER — VENLAFAXINE HCL ER 75 MG PO CP24: 75.0000 mg | ORAL_CAPSULE | Freq: Every evening | ORAL | 0 refills | Status: DC

## 2021-01-25 MED ORDER — DULOXETINE HCL 60 MG PO CPEP: 60.0000 mg | ORAL_CAPSULE | Freq: Every morning | ORAL | 3 refills | Status: DC

## 2021-01-25 MED ORDER — ARMODAFINIL 50 MG PO TABS: 100.0000 mg | ORAL_TABLET | Freq: Every day | ORAL | 1 refills | Status: DC

## 2021-01-25 NOTE — Telephone Encounter (Signed)
Hi Dr. Terrace Arabia,  Based on the results of the MRI and my continued eye pain almost everyday, what do you think the next best step is for me? Is there anything you recommend that would be able to help me? Sometimes when I stand and walk around the eye pain radiates to the top of my head and worsens. I look forward to your guidance on this. Thank you so much.    Dana Duke  Please call patient, the described eye pain could be possible migraine variant,  1, we can potentially increase the dose of Effexor to 75 mg daily 2 she can try her current migraine medicine to see if that helps her eye pain

## 2021-01-25 NOTE — Addendum Note (Signed)
Addended by: Lindell Spar C on: 01/25/2021 02:29 PM   Modules accepted: Orders

## 2021-01-25 NOTE — Telephone Encounter (Signed)
She has not had any good relief with her migraine rescue medications. She is agreeable to try the higher dose of venlafaxine. She would like a 90-day supply sent to the pharmacy.  She would also like a 90-day refills of duloxetine, Ajovy and armodafinil sent to the pharmacy.  Per vo by Dr. Terrace Arabia, she should take the duloxetine 60mg  in the morning and venlafaxine XR 75mg  in the evening. The patient verbalized understanding.

## 2021-01-30 ENCOUNTER — Other Ambulatory Visit: Payer: Self-pay

## 2021-01-30 ENCOUNTER — Ambulatory Visit: Payer: BC Managed Care – PPO

## 2021-01-30 DIAGNOSIS — R2681 Unsteadiness on feet: Secondary | ICD-10-CM

## 2021-01-30 DIAGNOSIS — R2689 Other abnormalities of gait and mobility: Secondary | ICD-10-CM

## 2021-01-30 DIAGNOSIS — M6281 Muscle weakness (generalized): Secondary | ICD-10-CM

## 2021-01-30 NOTE — Therapy (Signed)
[facility] [address] [place], [place], [address] Phone: [phone]   Fax:  [phone]  Physical Therapy Treatment/Discharge Summary  Patient Details  Name: Dana Duke MRN: [id number] Date of Birth: 1970/10/17 Referring Provider (PT): Marcial Pacas PHYSICAL THERAPY DISCHARGE SUMMARY  Visits from Start of Care: 11  Current functional level related to goals / functional outcomes: See clinical impression and goals for more information.   Remaining deficits: Weakness, balance deficits, fatigues with longer activities   Education / Equipment: HEP, left foot-up brace   Patient agrees to discharge. Patient goals were met. Patient is being discharged due to meeting the stated rehab goals.   Encounter Date: 01/30/2021   PT End of Session - 01/30/21 1403     Visit Number 11    Number of Visits 13    Date for PT Re-Evaluation 02/02/21    Authorization Type [employer] with 30 visit limit. (used 9 visits in pelvic PT at time of this PT eval and still in pelvic health PT)    PT Start Time 1401    PT Stop Time 1424   discharge visit   PT Time Calculation (min) 23 min    Equipment Utilized During Treatment Gait belt    Activity Tolerance Patient limited by fatigue;Patient tolerated treatment well    Behavior During Therapy WFL for tasks assessed/performed             Past Medical History:  Diagnosis Date   Ankle fracture, right    History of hysterectomy    Knee joint dislocation    bilateral   Migraines    Multiple sclerosis ([place])     Past Surgical History:  Procedure Laterality Date   ABDOMINAL HYSTERECTOMY     CESAREAN SECTION     MYOMECTOMY      There were no vitals filed for this visit.   Subjective Assessment - 01/30/21 1403     Subjective Pt reports she is doing well. Helped husband with some painting this weekend.    Patient Stated Goals Pt wants to be able to walk better with less fatigue and  less pain.    Currently in Pain? Yes    Pain Score 5     Pain Location Knee    Pain Orientation Left    Pain Descriptors / Indicators Aching;Burning    Pain Type Chronic pain    Pain Onset More than a month ago    Pain Frequency Constant    Pain Onset More than a month ago                               [facility] Adult PT Treatment/Exercise - 01/30/21 1404       Ambulation/Gait   Ambulation/Gait Yes    Ambulation/Gait Assistance 6: Modified independent (Device/Increase time)    Ambulation/Gait Assistance Details Pt was cued to remember to engage core to help with stability. Does have increased ataxia as fatigues but no longer feels like legs are going to give out on her.    Ambulation Distance (Feet) 500 Feet    Assistive device None   left foot up brace   Gait Pattern Step-through pattern;Decreased arm swing - right;Decreased arm swing - left;Ataxic    Ambulation Surface Level;Unlevel;Indoor;Outdoor;Paved      Standardized Balance Assessment   Standardized Balance Assessment Berg Balance Test      Berg Balance Test   Sit to Stand Able  to stand without using hands and stabilize independently    Standing Unsupported Able to stand safely 2 minutes    Sitting with Back Unsupported but Feet Supported on Floor or Stool Able to sit safely and securely 2 minutes    Stand to Sit Sits safely with minimal use of hands    Transfers Able to transfer safely, minor use of hands    Standing Unsupported with Eyes Closed Able to stand 10 seconds safely    Standing Ubsupported with Feet Together Able to place feet together independently and stand 1 minute safely    From Standing, Reach Forward with Outstretched Arm Can reach confidently >25 cm (10")    From Standing Position, Pick up Object from Floor Able to pick up shoe safely and easily    From Standing Position, Turn to Look Behind Over each Shoulder Looks behind from both sides and weight shifts well    Turn 360 Degrees Able  to turn 360 degrees safely in 4 seconds or less    Standing Unsupported, Alternately Place Feet on Step/Stool Able to stand independently and safely and complete 8 steps in 20 seconds    Standing Unsupported, One Foot in Front Able to plae foot ahead of the other independently and hold 30 seconds    Standing on One Leg Tries to lift leg/unable to hold 3 seconds but remains standing independently    Total Score 52                     PT Education - 01/30/21 1504     Education Details Progress towards goals and discharge as planned.    Person(s) Educated Patient    Methods Explanation    Comprehension Verbalized understanding              PT Short Term Goals - 01/05/21 1541       PT SHORT TERM GOAL #1   Title Pt will be independent with initial HEP for strengthening and balance.    Time 4    Period Weeks    Status Achieved    Target Date 01/05/21      PT SHORT TERM GOAL #2   Title Pt will decrease 5 x sit to stand from 14.5 sec to <12 sec for improved balance and functional strength.    Baseline 12/08/20 14.5 sec without AD. 01/03/21 11.88 sec from chair without arms    Time 4    Period Weeks    Status Achieved    Target Date 01/05/21      PT SHORT TERM GOAL #3   Title Pt will ambulate >250' with LRAD supervision for improved household and short community distances.    Baseline 01/03/21 Pt is supervision level with RW and supervision/CGA without AD on level surfaces.    Time 4    Period Weeks    Status Partially Met    Target Date 01/05/21      PT SHORT TERM GOAL #4   Title Berg balance will be assessed to further assess balance and LTG written.    Baseline 12/13/20 Berg performed with score of 31/56 and LTG written    Time 4    Period Weeks    Status Achieved    Target Date 01/05/21      PT SHORT TERM GOAL #5   Title Pt will be assessed for possible AFO need to see if could improve stability with gait.    Baseline 01/03/21 Pt was assessed for  AFO today  and left foot-up brace was what worked best/pt liked.    Time 4    Period Weeks    Status Achieved    Target Date 01/05/21               PT Long Term Goals - 01/30/21 1405       PT LONG TERM GOAL #1   Title Pt will verbalize understanding of fall prevention strategies and energy conservation. (LTGs due 02/02/21)    Baseline 01/30/21 Pt denies any questions on fall prevention strategies.    Time 8    Period Weeks    Status Achieved      PT LONG TERM GOAL #2   Title Pt will improve Berg from 31 to >37/56 for improved balance and decrease fall risk.    Baseline 12/13/20 31/56. 01/30/21 52/56    Time 8    Period Weeks    Status Achieved      PT LONG TERM GOAL #3   Title Pt will increase gait speed from 0.12ms to >0.859m for improved gait safety in community.    Baseline 12/08/20 0.718m 01/23/21 0.13m57m  Time 8    Period Weeks    Status Achieved      PT LONG TERM GOAL #4   Title Pt will ambulate > 400' on paved outdoor/various indoor surfaces with LRAD modified independent for improved functional mobility.    Baseline 01/30/21 500' on sidewalk and indoor surfaces without AD mod I with left foot up brace    Time 8    Period Weeks    Status Achieved                   Plan - 01/30/21 1505     Clinical Impression Statement Pt met all LTGs today. She is ambulating at gait speed that is safer for community mobility with 0.13m/60mt currently able to ambulate ~500' with left foot up brace with less fatigue but does get more ataxic as goes on. Better able to correct LOB on own now. Increased Berg score to 52/56 indicating low fall risk now. Pt feels good with current exercises and husband works on them with her. Will discharge at this time.    Personal Factors and Comorbidities Time since onset of injury/illness/exacerbation    Examination-Activity Limitations Locomotion Level;Transfers;Stairs;Stand    Examination-Participation Restrictions Cleaning;Community  Activity;Meal Prep    Stability/Clinical Decision Making Evolving/Moderate complexity    Rehab Potential Good    PT Frequency 2x / week   followed by 1x/week for 4 weeks. Plus eval   PT Duration 4 weeks    PT Treatment/Interventions ADLs/Self Care Home Management;DME Instruction;Cryotherapy;Gait training;Stair training;Functional mobility training;Therapeutic activities;Therapeutic exercise;Balance training;Neuromuscular re-education;Manual techniques;Orthotic Fit/Training;Patient/family education;Vestibular;Passive range of motion    PT Next Visit Plan Discharge PT    Consulted and Agree with Plan of Care Patient             Patient will benefit from skilled therapeutic intervention in order to improve the following deficits and impairments:  Abnormal gait, Decreased endurance, Decreased balance, Decreased mobility, Decreased knowledge of use of DME, Decreased strength, Pain  Visit Diagnosis: Other abnormalities of gait and mobility  Muscle weakness (generalized)  Unsteadiness on feet     Problem List Patient Active Problem List   Diagnosis Date Noted   Eye pain, right 01/03/2021   Relapsing remitting multiple sclerosis (HCC) Lucien23/2022   Fatigue 12/05/2020   Spasticity 06/29/2015   ECG abnormality 06/23/2014  Migraine without aura and without status migrainosus, not intractable 06/16/2014   Nonspecific abnormal electrocardiogram (ECG) (EKG) 06/14/2014   Headache 10/28/2013   Multiple sclerosis ([place]) 08/23/2013   Gait difficulty 02/22/2013    Electa Sniff, PT, DPT, NCS 01/30/2021, 3:08 PM  [facility] [address] [place], [place], [address] Phone: [phone]   Fax:  [phone]  Name: Deaysia Grigoryan MRN: [id number] Date of Birth: [birth year]

## 2021-02-15 ENCOUNTER — Telehealth: Payer: Self-pay | Admitting: *Deleted

## 2021-02-15 NOTE — Telephone Encounter (Signed)
Duplicate note opened in error  

## 2021-02-15 NOTE — Telephone Encounter (Signed)
PA for Ubrelvy 50mg  completed on covermymeds (key: [id number]). Decision pending.

## 2021-02-19 NOTE — Telephone Encounter (Signed)
Approved through 02/14/2022.

## 2021-03-12 ENCOUNTER — Ambulatory Visit: Payer: BC Managed Care – PPO | Admitting: Neurology

## 2021-04-03 ENCOUNTER — Other Ambulatory Visit: Payer: Self-pay | Admitting: Obstetrics and Gynecology

## 2021-04-03 ENCOUNTER — Telehealth: Payer: Self-pay | Admitting: *Deleted

## 2021-04-03 NOTE — Telephone Encounter (Signed)
PA for Ajovy 225mg  started on covermymeds (key: [id number]). Pt has pharmacy coverage through [facility] [phone]). Decision pending.

## 2021-04-03 NOTE — Telephone Encounter (Signed)
She is under our care for MS.  The patient dropped off her renewal application for [employer] ([employer] [place] is the [employer]'s shared-ride transportation service for eligible riders who have a disability).  It has been completed and signed by Margie Ege, NP. Dr. Terrace Arabia is out of the office this week.  The form has been placed up front and the patient is coming to pick it up.

## 2021-04-04 ENCOUNTER — Ambulatory Visit
Admission: RE | Admit: 2021-04-04 | Discharge: 2021-04-04 | Disposition: A | Discharge status: Home / Self Care | Payer: BC Managed Care – PPO | Source: Ambulatory Visit | Attending: Family Medicine | Admitting: Family Medicine

## 2021-04-04 DIAGNOSIS — N6310 Unspecified lump in the right breast, unspecified quadrant: Secondary | ICD-10-CM

## 2021-04-05 NOTE — Telephone Encounter (Signed)
Approved through 04/02/2022.

## 2021-06-05 ENCOUNTER — Telehealth: Payer: Self-pay | Admitting: Neurology

## 2021-06-05 ENCOUNTER — Ambulatory Visit (INDEPENDENT_AMBULATORY_CARE_PROVIDER_SITE_OTHER): Payer: BC Managed Care – PPO | Admitting: Neurology

## 2021-06-05 DIAGNOSIS — G35 Multiple sclerosis: Secondary | ICD-10-CM | POA: Diagnosis not present

## 2021-06-05 DIAGNOSIS — R5383 Other fatigue: Secondary | ICD-10-CM | POA: Diagnosis not present

## 2021-06-05 DIAGNOSIS — G43009 Migraine without aura, not intractable, without status migrainosus: Secondary | ICD-10-CM | POA: Diagnosis not present

## 2021-06-05 MED ORDER — VENLAFAXINE HCL ER 75 MG PO CP24: 75.0000 mg | ORAL_CAPSULE | Freq: Every evening | ORAL | 3 refills | Status: DC

## 2021-06-05 NOTE — Progress Notes (Addendum)
PATIENT: Dana Duke DOB: [birth year]  REASON FOR VISIT: Follow up for MS HISTORY FROM: Patient PRIMARY NEUROLOGIST: Dr. Terrace Arabia  HISTORY  Dana Duke is a 51 year old female, follow-up for relapsing remitting multiple sclerosis, primary care physician is Dr. Jean Rosenthal, Caroline More, MD   Since 2011 she noticed gradual onset slow worsening gait difficulty, she tends to drag her foot across the floor, stiff, has difficulty going upstairs, because of knee pain, she suffered a history of right ankle fracture in 2010, seems to dragging her right foot across the floor more, also complains urinary urgency, no bowel incontinence   MRI at [facility] in May 7th 2015, without contrast   MRI cervical spine (without) demonstrating at least 4 spinal cord T2 hyperintensities at cervico-medullary junction, C2, C3-4 and C6.  MRI brain (without) demonstrating multiple (> 20) round and ovoid, periventricular, callosal and peri-callosal, subcortical, juxtacortical, pontine and cerebellar T2 hyperintensities. Some of these are perpendicular to the lateral ventricles on axial and sagittal views. Some of these are hypointense on T1 views. Findings suspicious for demyelinating disease   CSF showed 5 oligoclonal banding, elevated IgG index   Extensive laboratory evaluations showed normal or negative CBC, CMP, Lyme titer, ANA, NMO, TSH, B12, folic acid, RPR. Positive varicellar zoster antibody   The visual evoked response test above was within normal limits on the left, with prolongation of the P100 latency on the right.    JC virus antibody was 0.15 negative in 10/18/2013   MRI of brain, and cervical spine with contrast, showed no contrast enhancement, there was no lesions noticed in MRI thoracic spine,   She started Plegridy since July 2015,  began full dose injection since Sept 15 2015. Complains of injection site reactio, lasting 2-4 weeks, she only has mild flu like illness     She was started on prednisone   tapering since October 16,2015 for worsening gait abnormality, fall,  tolerating the medication well, seems to have mild improvement in her gait difficulty   She also complains of frequent headaches, consistent with migraines, previously tried nortriptyline 20 mg at night as preventive medication    Around 2016, she began to noticed increased lower extremity spasticity, gait abnormality, also developed urinary frequency, urgency,    MRI of the brain with and without contrast in November 2016:  abnormal MRI of the brain with and without contrast showing T2/FLAIR hyperintense foci at the cervicomedullary junction and within the pons, midbrain and cerebellum in the posterior fossa. Additionally, there are many T2/FLAIR hyperintense foci in the cerebral hemispheres, predominantly in the periventricular white matter.  The foci are consistent with chronic demyelinating plaques from multiple sclerosis.     None of the foci appears to be acute and there are no enhancing lesions.    abnormal MRI of the cervical spine showing T2 hyperintense foci within the spinal cord at the cervicomedullary junction, C2, C3, C5C6 and C6 as detailed above.   Additional foci are noted within the pons.  None of the foci appears to be acute. No significant degenerative changes are noted.   The lesions are consistent with chronic demyelinating plaques as would be seen in multiple sclerosis. The findings are similar to the MRI report from 08/19/2013.     She started Tysarbri infusion since January 2017,    MRI of the brain without contrast in November 2017:Abnormal MRI scan of the brain with and without contrast showing multiple periventricular, periatrial, subcortical and brainstem white matter hyperintensities consistent with chronic demyelinating disease.  No enhancing lesions are noted. Overall no significant change compared with previous MRI scan dated 03/01/2015   Over the years, she has increased gait abnormality gait  abnormality, failed to respond well to Ampyra,    MRI cervical spine 01/02/2018, demyelinating plaque C2, 3, C5-6 and 7 level, no change compared to MRI in 2016   MRI of the brain with without contrast few small round periventricular subcortical, pontine, and cerebellar demyelinating plaque, no acute findings no change compared to MRI in 2018   Laboratory evaluation showed negative anti-Tysarbri antibody,    Started ocrelizumab since fall of  2020   MRI of the brain without contrast February 12, 2019, multiple T2/flair hyperintensity foci in the brainstem, spinal cord, cerebellum, cerebral hemisphere, in a pattern, and configuration, consistent with chronic demyelinating plaque associated with multiple sclerosis.  There was no change compared to previous MRI in November 2019.  No contrast-enhancement   UPDATE Jan 7th 2021: She came to clinic earlier than expected for worsening migraine headaches, she had 7 days of migraine around Christmas time, had recurrent migraine again over the past 4 days, failed multiple home dose of Imitrex, today she still complains 6 out of 10 right temporal area pounding headache, worsening with movement, light noise sensitivity, with mild nausea   Her headache failed to improve with 50 mg  Ubrevyl sample, but much improved after she was given IV Depacon,,along with 10 mg of Compazine Toradol 30 mg   UPDATE December 05 2020: She had right eye pain, was seen by ophthalmology today, diagnosed was right optic neuritis, but she denies visual change, was giving a steroid Dosepak H,  She still complains of increased migraine headache over the past 2 months, almost daily basis, aimovig monthly does not help,   Continue complains worsening gait abnormality, has worsening bowel and bladder incontinence, is seen by urologist, pelvic physical therapy,   Also complains of increased to fatigue,  Update June 05, 2021 SS: Exhausted from the walk back here, no assistive device,  has to hold on to wall. Completed PT with good benefit, has walker to use. On Ocrevus, due again in May, comes here. MRI of the brain and cervical spine were stable back in October 2022. Few nights ago severe pain to left knee, gets shots in the knee? Has seen orthopedics in the past. Still right eye issues, 3/10 right now. Saw Dr. Dione Booze, reportedly was unremarkable. Was treated with steroids without benefit. Headaches are very good, still on Ajovy, but having to pay $ 400 a month, until reaches deductible, rarely has to take Bernita Raisin, maybe twice a month. Remains on Armodafinil. Has urinary/bowel stimulator placed Nov 2022, 75 % helpful to prevent incontinence.   REVIEW OF SYSTEMS: Out of a complete 14 system review of symptoms, the patient complains only of the following symptoms, and all other reviewed systems are negative.  See HPI  ALLERGIES: Allergies  Allergen Reactions   Sulfa Antibiotics Nausea And Vomiting    HOME MEDICATIONS: Outpatient Medications Prior to Visit  Medication Sig Dispense Refill   Armodafinil 50 MG tablet Take 2 tablets (100 mg total) by mouth daily. 180 tablet 1   Cholecalciferol 25 MCG (1000 UT) tablet Take 1,000 Units by mouth daily.     DULoxetine (CYMBALTA) 60 MG capsule Take 1 capsule (60 mg total) by mouth in the morning. 90 capsule 3   Fremanezumab-vfrm (AJOVY) 225 MG/1.5ML SOAJ Inject 225 mg into the skin every 30 (thirty) days. 4.5 mL 3   LINZESS  290 MCG CAPS capsule Take 290 mcg by mouth daily.     MYRBETRIQ 50 MG TB24 tablet Take 50 mg by mouth daily.     ocrelizumab (OCREVUS) 300 MG/10ML injection Inject into the vein.     PREDNISONE PO Take by mouth. Take four tabs daily x one week, then three tabs daily x 3 three days, then 2 tabs x 2 days, one tab x 1 day, then stop.     promethazine (PHENERGAN) 12.5 MG tablet Take 12.5 mg by mouth every 6 (six) hours as needed.     solifenacin (VESICARE) 5 MG tablet Take 5 mg by mouth daily.     SUMAtriptan  (IMITREX) 100 MG tablet Take 1 tablet (100 mg total) by mouth once as needed for up to 1 dose for migraine. May repeat in 2 hours if headache persists or recurs. 12 tablet 11   Ubrogepant (UBRELVY) 50 MG TABS Take 1 tab at onset of migraine.  May repeat in 2 hrs, if needed.  Max dose: 2 tabs/day. This is a 30 day prescription. 15 tablet 11   venlafaxine XR (EFFEXOR XR) 75 MG 24 hr capsule Take 1 capsule (75 mg total) by mouth every evening. 90 capsule 0   Facility-Administered Medications Prior to Visit  Medication Dose Route Frequency Provider Last Rate Last Admin   gadopentetate dimeglumine (MAGNEVIST) injection 20 mL  20 mL Intravenous Once PRN Levert Feinstein, MD        PAST MEDICAL HISTORY: Past Medical History:  Diagnosis Date   Ankle fracture, right    History of hysterectomy    Knee joint dislocation    bilateral   Migraines    Multiple sclerosis (HCC)     PAST SURGICAL HISTORY: Past Surgical History:  Procedure Laterality Date   ABDOMINAL HYSTERECTOMY     CESAREAN SECTION     MYOMECTOMY      FAMILY HISTORY: Family History  Problem Relation Age of Onset   Diabetes Mother    Osteoarthritis Other    Heart disease Other    Rheumatic fever Other    Hypertension Other     SOCIAL HISTORY: Social History   Socioeconomic History   Marital status: Married    Spouse name: Merchant navy officer   Number of children: 1   Years of education: college   Highest education level: Not on file  Occupational History    Employer: [employer]    Comment: Does not work  Tobacco Use   Smoking status: Never   Smokeless tobacco: Never  Substance and Sexual Activity   Alcohol use: No   Drug use: No   Sexual activity: Not on file  Other Topics Concern   Not on file  Social History Narrative   Patient is married. [name]).    Patient is unemployed.   Education- College education   Right handed.   Caffeine- None   Social Determinants of Health   Financial Resource Strain: Not on file   Food Insecurity: Not on file  Transportation Needs: Not on file  Physical Activity: Not on file  Stress: Not on file  Social Connections: Not on file  Intimate Partner Violence: Not on file   PHYSICAL EXAM  There were no vitals filed for this visit. There is no height or weight on file to calculate BMI.  Generalized: Well developed, in no acute distress   Neurological examination  Mentation: Alert oriented to time, place, history taking. Follows all commands speech and language fluent, very pleasant Cranial  nerve II-XII: Pupils were equal round reactive to light. Extraocular movements were full, visual field were full on confrontational test. Facial sensation and strength were normal. Head turning and shoulder shrug  were normal and symmetric. Motor: Good strength all extremities, 4/5 left lower extremity, pain to left knee with movement Sensory: Sensory testing is intact to soft touch on all 4 extremities. No evidence of extinction is noted.  Coordination: Cerebellar testing reveals good finger-nose-finger and heel-to-shin bilaterally.  Gait and station: Has to push off from seated position, gait is slightly forward leaning, antalgic on the left, mildly unsteady Reflexes: Deep tendon reflexes are symmetric but increased  DIAGNOSTIC DATA (LABS, IMAGING, TESTING) - I reviewed patient records, labs, notes, testing and imaging myself where available.  Lab Results  Component Value Date   WBC 10.1 09/05/2020   HGB 12.2 09/05/2020   HCT 37.7 09/05/2020   MCV 83 09/05/2020   PLT 281 09/05/2020      Component Value Date/Time   NA 142 09/05/2020 1311   K 3.5 09/05/2020 1311   CL 104 09/05/2020 1311   CO2 24 09/05/2020 1311   GLUCOSE 96 09/05/2020 1311   BUN 14 09/05/2020 1311   CREATININE 0.66 09/05/2020 1311   CALCIUM 9.6 09/05/2020 1311   PROT 7.1 09/05/2020 1311   ALBUMIN 4.3 09/05/2020 1311   AST 15 09/05/2020 1311   ALT 9 09/05/2020 1311   ALKPHOS 71 09/05/2020 1311    BILITOT 0.2 09/05/2020 1311   GFRNONAA 82 03/07/2020 1321   GFRAA 94 03/07/2020 1321   No results found for: CHOL, HDL, LDLCALC, LDLDIRECT, TRIG, CHOLHDL No results found for: HERD4Y Lab Results  Component Value Date   VITAMINB12 314 08/23/2013   Lab Results  Component Value Date   TSH 1.130 09/29/2017   ASSESSMENT AND PLAN 51 y.o. year old female   1.  Relapsing remitting multiple sclerosis -Stable overall, continue Ocrevus, check labs today -On Ocrevus since fall 2020 -MRI of the brain with and without contrast and cervical spine in October 2022 was overall stable, no new lesions compared to prior November 2021 -Will get note from Dr. Dione Booze, right optic neuritis back in August 2022, still mild pain behind right eye -Continue Effexor, Cymbalta for mood/achy pain -Encouraged to reach back out to orthopedic doctor to see about injections to left knee   2.  Fatigue -Continue armodafinil 50 mg, 2 tablets daily, check drug registry, only refilled once for 30 day supply, takes PRN  3.  Chronic migraine headache -Doing very well, under excellent control, given 2 samples of Ajovy due to high cost right now with insurance -Continue Ajovy 225 mg monthly injection for migraine prevention -Continue Ubrelvy as acute headache treatment -Follow up in 6 months or sooner if needed   Addendum 06/07/2021 SS: I received ophthalmology evaluation from Dr. Dione Booze from January 17, 2021, was referred for eye pain since July, had seen [facility] Dr. Santiago Bumpers 3 times, tried prednisone for 2 to 3 weeks for inflammation, but no help with pain.  Went back to Dr. Santiago Bumpers, inflammation was gone, but still having pain.  Had the same problem last year that lasted a while and was told inflammation, but treated for dry eye.    A carefully dilated exam was performed with Dr. Dione Booze, no ocular cause for pain was found, no sign of systemic disease that would cause pain.  Could possibly be related to migraine?  No sign of current or previous episode of optic neuritis. Return  in 1 year.   Margie Ege, AGNP-C, DNP 06/05/2021, 3:22 PM [facility] [address], [address] [place], [place] [address] [phone]

## 2021-06-05 NOTE — Telephone Encounter (Signed)
Request made today  

## 2021-06-05 NOTE — Telephone Encounter (Signed)
Please get records from Dr. Dione Booze for last office visit for right eye pain. Thanks!

## 2021-06-06 LAB — COMPREHENSIVE METABOLIC PANEL
ALT: 9 IU/L (ref 0–32)
AST: 16 IU/L (ref 0–40)
Albumin/Globulin Ratio: 1.7 (ref 1.2–2.2)
Albumin: 4.7 g/dL (ref 3.8–4.9)
Alkaline Phosphatase: 83 IU/L (ref 44–121)
BUN/Creatinine Ratio: 14 (ref 9–23)
BUN: 10 mg/dL (ref 6–24)
Bilirubin Total: <0.2 mg/dL (ref 0.0–1.2)
CO2: 27 mmol/L (ref 20–29)
Calcium: 9.6 mg/dL (ref 8.7–10.2)
Chloride: 104 mmol/L (ref 96–106)
Creatinine, Ser: 0.74 mg/dL (ref 0.57–1.00)
Globulin, Total: 2.8 g/dL (ref 1.5–4.5)
Glucose: 90 mg/dL (ref 70–99)
Potassium: 4.4 mmol/L (ref 3.5–5.2)
Sodium: 142 mmol/L (ref 134–144)
Total Protein: 7.5 g/dL (ref 6.0–8.5)
eGFR: 97 mL/min/{1.73_m2} (ref >59)

## 2021-06-06 LAB — CBC WITH DIFFERENTIAL/PLATELET
Basophils Absolute: 0 10*3/uL (ref 0.0–0.2)
Basos: 0 %
EOS (ABSOLUTE): 0.1 10*3/uL (ref 0.0–0.4)
Eos: 2 %
Hematocrit: 41.5 % (ref 34.0–46.6)
Hemoglobin: 13.6 g/dL (ref 11.1–15.9)
Immature Grans (Abs): 0 10*3/uL (ref 0.0–0.1)
Immature Granulocytes: 0 %
Lymphocytes Absolute: 2.4 10*3/uL (ref 0.7–3.1)
Lymphs: 35 %
MCH: 27 pg (ref 26.6–33.0)
MCHC: 32.8 g/dL (ref 31.5–35.7)
MCV: 83 fL (ref 79–97)
Monocytes Absolute: 0.6 10*3/uL (ref 0.1–0.9)
Monocytes: 9 %
Neutrophils Absolute: 3.7 10*3/uL (ref 1.4–7.0)
Neutrophils: 54 %
Platelets: 276 10*3/uL (ref 150–450)
RBC: 5.03 x10E6/uL (ref 3.77–5.28)
RDW: 13.9 % (ref 11.7–15.4)
WBC: 6.8 10*3/uL (ref 3.4–10.8)

## 2021-06-06 LAB — IGG, IGA, IGM
IgA/Immunoglobulin A, Serum: 163 mg/dL (ref 87–352)
IgG (Immunoglobin G), Serum: 1462 mg/dL (ref 586–1602)
IgM (Immunoglobulin M), Srm: 126 mg/dL (ref 26–217)

## 2021-06-18 ENCOUNTER — Encounter: Payer: Self-pay | Admitting: Neurology

## 2021-06-18 DIAGNOSIS — G35 Multiple sclerosis: Secondary | ICD-10-CM

## 2021-06-18 DIAGNOSIS — R269 Unspecified abnormalities of gait and mobility: Secondary | ICD-10-CM

## 2021-06-19 ENCOUNTER — Telehealth: Payer: Self-pay | Admitting: Neurology

## 2021-06-19 NOTE — Telephone Encounter (Signed)
Referral sent to [facility] [phone]. ?

## 2021-07-18 NOTE — Telephone Encounter (Signed)
Resent referral to [facility] [phone]. ?

## 2021-07-30 ENCOUNTER — Telehealth: Payer: Self-pay | Admitting: *Deleted

## 2021-07-30 NOTE — Telephone Encounter (Signed)
PA for Ubrelvy 50mg  started on covermymeds (key: ). Pharmacy coverage through [employer] [phone]). Decision pending. ?

## 2021-07-30 NOTE — Telephone Encounter (Signed)
No change in insurance plan. Received notification that PA is already on file through 02/14/2022. ?

## 2021-09-17 ENCOUNTER — Telehealth: Payer: Self-pay | Admitting: Neurology

## 2021-09-17 NOTE — Telephone Encounter (Signed)
error 

## 2021-09-19 ENCOUNTER — Other Ambulatory Visit: Payer: Self-pay | Admitting: Neurology

## 2021-10-23 ENCOUNTER — Encounter: Payer: Self-pay | Admitting: Neurology

## 2021-10-24 ENCOUNTER — Encounter: Payer: Self-pay | Admitting: Neurology

## 2021-10-24 ENCOUNTER — Ambulatory Visit (INDEPENDENT_AMBULATORY_CARE_PROVIDER_SITE_OTHER): Payer: BC Managed Care – PPO | Admitting: Neurology

## 2021-10-24 VITALS — BP 121/81 | HR 89 | Ht 66.0 in | Wt 195.0 lb

## 2021-10-24 DIAGNOSIS — G35 Multiple sclerosis: Secondary | ICD-10-CM | POA: Diagnosis not present

## 2021-10-24 DIAGNOSIS — R269 Unspecified abnormalities of gait and mobility: Secondary | ICD-10-CM | POA: Diagnosis not present

## 2021-10-24 DIAGNOSIS — R5383 Other fatigue: Secondary | ICD-10-CM

## 2021-10-24 MED ORDER — MODAFINIL 100 MG PO TABS: 100.0000 mg | ORAL_TABLET | Freq: Every day | ORAL | 5 refills | Status: DC

## 2021-10-24 NOTE — Patient Instructions (Signed)
Check labs today, urinalysis Try Provigil for fatigue Please get the xray to confirm placement of stimulator Monitor symptoms

## 2021-10-24 NOTE — Progress Notes (Signed)
PATIENT: Denna Haggard DOB: 1970-09-24  REASON FOR VISIT: Follow up for MS HISTORY FROM: Patient, husband PRIMARY NEUROLOGIST: Dr. Terrace Arabia  HISTORY  Dayanne Graw is a 51 year old female, follow-up for relapsing remitting multiple sclerosis, primary care physician is Dr. Jean Rosenthal, Caroline More, MD   Since 2011 she noticed gradual onset slow worsening gait difficulty, she tends to drag her foot across the floor, stiff, has difficulty going upstairs, because of knee pain, she suffered a history of right ankle fracture in 2010, seems to dragging her right foot across the floor more, also complains urinary urgency, no bowel incontinence   MRI at [facility] in May 7th 2015, without contrast   MRI cervical spine (without) demonstrating at least 4 spinal cord T2 hyperintensities at cervico-medullary junction, C2, C3-4 and C6.  MRI brain (without) demonstrating multiple (> 20) round and ovoid, periventricular, callosal and peri-callosal, subcortical, juxtacortical, pontine and cerebellar T2 hyperintensities. Some of these are perpendicular to the lateral ventricles on axial and sagittal views. Some of these are hypointense on T1 views. Findings suspicious for demyelinating disease   CSF showed 5 oligoclonal banding, elevated IgG index   Extensive laboratory evaluations showed normal or negative CBC, CMP, Lyme titer, ANA, NMO, TSH, B12, folic acid, RPR. Positive varicellar zoster antibody   The visual evoked response test above was within normal limits on the left, with prolongation of the P100 latency on the right.    JC virus antibody was 0.15 negative in 10/18/2013   MRI of brain, and cervical spine with contrast, showed no contrast enhancement, there was no lesions noticed in MRI thoracic spine,   She started Plegridy since July 2015,  began full dose injection since Sept 15 2015. Complains of injection site reactio, lasting 2-4 weeks, she only has mild flu like illness     She was started on  prednisone  tapering since October 16,2015 for worsening gait abnormality, fall,  tolerating the medication well, seems to have mild improvement in her gait difficulty   She also complains of frequent headaches, consistent with migraines, previously tried nortriptyline 20 mg at night as preventive medication    Around 2016, she began to noticed increased lower extremity spasticity, gait abnormality, also developed urinary frequency, urgency,    MRI of the brain with and without contrast in November 2016:  abnormal MRI of the brain with and without contrast showing T2/FLAIR hyperintense foci at the cervicomedullary junction and within the pons, midbrain and cerebellum in the posterior fossa. Additionally, there are many T2/FLAIR hyperintense foci in the cerebral hemispheres, predominantly in the periventricular white matter.  The foci are consistent with chronic demyelinating plaques from multiple sclerosis.     None of the foci appears to be acute and there are no enhancing lesions.    abnormal MRI of the cervical spine showing T2 hyperintense foci within the spinal cord at the cervicomedullary junction, C2, C3, C5C6 and C6 as detailed above.   Additional foci are noted within the pons.  None of the foci appears to be acute. No significant degenerative changes are noted.   The lesions are consistent with chronic demyelinating plaques as would be seen in multiple sclerosis. The findings are similar to the MRI report from 08/19/2013.     She started Tysarbri infusion since January 2017,    MRI of the brain without contrast in November 2017:Abnormal MRI scan of the brain with and without contrast showing multiple periventricular, periatrial, subcortical and brainstem white matter hyperintensities consistent with chronic demyelinating  disease. No enhancing lesions are noted. Overall no significant change compared with previous MRI scan dated 03/01/2015   Over the years, she has increased gait abnormality  gait abnormality, failed to respond well to Ampyra,    MRI cervical spine 01/02/2018, demyelinating plaque C2, 3, C5-6 and 7 level, no change compared to MRI in 2016   MRI of the brain with without contrast few small round periventricular subcortical, pontine, and cerebellar demyelinating plaque, no acute findings no change compared to MRI in 2018   Laboratory evaluation showed negative anti-Tysarbri antibody,    Started ocrelizumab since fall of  2020   MRI of the brain without contrast February 12, 2019, multiple T2/flair hyperintensity foci in the brainstem, spinal cord, cerebellum, cerebral hemisphere, in a pattern, and configuration, consistent with chronic demyelinating plaque associated with multiple sclerosis.  There was no change compared to previous MRI in November 2019.  No contrast-enhancement   UPDATE Jan 7th 2021: She came to clinic earlier than expected for worsening migraine headaches, she had 7 days of migraine around Christmas time, had recurrent migraine again over the past 4 days, failed multiple home dose of Imitrex, today she still complains 6 out of 10 right temporal area pounding headache, worsening with movement, light noise sensitivity, with mild nausea   Her headache failed to improve with 50 mg  Ubrevyl sample, but much improved after she was given IV Depacon,,along with 10 mg of Compazine Toradol 30 mg   UPDATE December 05 2020: She had right eye pain, was seen by ophthalmology today, diagnosed was right optic neuritis, but she denies visual change, was giving a steroid Dosepak H,  She still complains of increased migraine headache over the past 2 months, almost daily basis, aimovig monthly does not help,   Continue complains worsening gait abnormality, has worsening bowel and bladder incontinence, is seen by urologist, pelvic physical therapy,   Also complains of increased to fatigue,  Update June 05, 2021 SS: Exhausted from the walk back here, no assistive  device, has to hold on to wall. Completed PT with good benefit, has walker to use. On Ocrevus, due again in May, comes here. MRI of the brain and cervical spine were stable back in October 2022. Few nights ago severe pain to left knee, gets shots in the knee? Has seen orthopedics in the past. Still right eye issues, 3/10 right now. Saw Dr. Dione Booze, reportedly was unremarkable. Was treated with steroids without benefit. Headaches are very good, still on Ajovy, but having to pay $ 400 a month, until reaches deductible, rarely has to take Bernita Raisin, maybe twice a month. Remains on Armodafinil. Has urinary/bowel stimulator placed Nov 2022, 75 % helpful to prevent incontinence.   Update October 24, 2021 SS: Has [employer] stimulator for bowel and bladder incontinence, saw Dr. Tereasa Coop 10/18/21 for increasing fecal incontinence, despite increasing the amplitude.  Had Ocrevus infusion in June. Xray was ordered to ensure the stimulator was the correct place. Went to orthopedics, did PT, was discharged. Couldn't do much for her. Had steroid injections to left knee without benefit. More trouble walking with the left side, due to left knee pain. Feels gets fatigued easily. Ran out of Armodafinil, did not feel that it was helpful.  Her daughter graduated from medical school, doing OB. Feels symptoms worse since July 1st.   REVIEW OF SYSTEMS: Out of a complete 14 system review of symptoms, the patient complains only of the following symptoms, and all other reviewed systems are negative.  See  HPI  ALLERGIES: Allergies  Allergen Reactions   Sulfa Antibiotics Nausea And Vomiting    HOME MEDICATIONS: Outpatient Medications Prior to Visit  Medication Sig Dispense Refill   Cholecalciferol 25 MCG (1000 UT) tablet Take 1,000 Units by mouth daily.     Fremanezumab-vfrm (AJOVY) 225 MG/1.5ML SOAJ Inject 225 mg into the skin every 30 (thirty) days. 4.5 mL 3   LINZESS 290 MCG CAPS capsule Take 290 mcg by mouth daily.     ocrelizumab  (OCREVUS) 300 MG/10ML injection Inject into the vein.     promethazine (PHENERGAN) 12.5 MG tablet Take 12.5 mg by mouth every 6 (six) hours as needed.     solifenacin (VESICARE) 5 MG tablet Take 5 mg by mouth daily.     SUMAtriptan (IMITREX) 100 MG tablet Take 1 tablet (100 mg total) by mouth once as needed for up to 1 dose for migraine. May repeat in 2 hours if headache persists or recurs. 12 tablet 11   Ubrogepant (UBRELVY) 50 MG TABS Take 1 tab at onset of migraine.  May repeat in 2 hrs, if needed.  Max dose: 2 tabs/day. This is a 30 day prescription. 15 tablet 11   venlafaxine XR (EFFEXOR XR) 75 MG 24 hr capsule Take 1 capsule (75 mg total) by mouth every evening. 90 capsule 3   Armodafinil 50 MG tablet Take 2 tablets (100 mg total) by mouth daily. 180 tablet 1   DULoxetine (CYMBALTA) 60 MG capsule Take 1 capsule (60 mg total) by mouth in the morning. 90 capsule 3   MYRBETRIQ 50 MG TB24 tablet Take 50 mg by mouth daily.     PREDNISONE PO Take by mouth. Take four tabs daily x one week, then three tabs daily x 3 three days, then 2 tabs x 2 days, one tab x 1 day, then stop.     Facility-Administered Medications Prior to Visit  Medication Dose Route Frequency Provider Last Rate Last Admin   gadopentetate dimeglumine (MAGNEVIST) injection 20 mL  20 mL Intravenous Once PRN Levert Feinstein, MD        PAST MEDICAL HISTORY: Past Medical History:  Diagnosis Date   Ankle fracture, right    History of hysterectomy    Knee joint dislocation    bilateral   Migraines    Multiple sclerosis (HCC)     PAST SURGICAL HISTORY: Past Surgical History:  Procedure Laterality Date   ABDOMINAL HYSTERECTOMY     CESAREAN SECTION     MYOMECTOMY      FAMILY HISTORY: Family History  Problem Relation Age of Onset   Diabetes Mother    Osteoarthritis Other    Heart disease Other    Rheumatic fever Other    Hypertension Other     SOCIAL HISTORY: Social History   Socioeconomic History   Marital status:  Married    Spouse name: Merchant navy officer   Number of children: 1   Years of education: college   Highest education level: Not on file  Occupational History    Employer: [employer]    Comment: Does not work  Tobacco Use   Smoking status: Never   Smokeless tobacco: Never  Substance and Sexual Activity   Alcohol use: No   Drug use: No   Sexual activity: Not on file  Other Topics Concern   Not on file  Social History Narrative   Patient is married. [name]).    Patient is unemployed.   Education- College education   Right handed.  Caffeine- None   Social Determinants of Health   Financial Resource Strain: Not on file  Food Insecurity: Not on file  Transportation Needs: Unmet Transportation Needs (09/19/2021)   PRAPARE - Administrator, Civil Service (Medical): Yes    Lack of Transportation (Non-Medical): Yes  Physical Activity: Not on file  Stress: Not on file  Social Connections: Not on file  Intimate Partner Violence: Not on file   PHYSICAL EXAM  Vitals:   10/24/21 1250  BP: 121/81  Pulse: 89  Weight: 195 lb (88.5 kg)  Height: 5\' 6"  (1.676 m)   Body mass index is 31.47 kg/m.  Generalized: Well developed, in no acute distress   Neurological examination  Mentation: Alert oriented to time, place, history taking. Follows all commands speech and language fluent, very pleasant, well-appearing today Cranial nerve II-XII: Pupils were equal round reactive to light. Extraocular movements were full, visual field were full on confrontational test. Facial sensation and strength were normal. Head turning and shoulder shrug were normal and symmetric. Motor: Good strength all extremities, 4/5 left lower extremity, pain to left knee, mild weakness left dorsiflexion Sensory: Sensory testing is intact to soft touch on all 4 extremities. No evidence of extinction is noted.  Coordination: Cerebellar testing reveals good finger-nose-finger and heel-to-shin bilaterally. But legs are  slower to lift Gait and station: tends to keep left leg straight, circumduction type gait, with speed becomes unsteady/exaggerated, tendency to brace on the wall,  Reflexes: Deep tendon reflexes are symmetric but increased  DIAGNOSTIC DATA (LABS, IMAGING, TESTING) - I reviewed patient records, labs, notes, testing and imaging myself where available.  Lab Results  Component Value Date   WBC 6.8 06/05/2021   HGB 13.6 06/05/2021   HCT 41.5 06/05/2021   MCV 83 06/05/2021   PLT 276 06/05/2021      Component Value Date/Time   NA 142 06/05/2021 1521   K 4.4 06/05/2021 1521   CL 104 06/05/2021 1521   CO2 27 06/05/2021 1521   GLUCOSE 90 06/05/2021 1521   BUN 10 06/05/2021 1521   CREATININE 0.74 06/05/2021 1521   CALCIUM 9.6 06/05/2021 1521   PROT 7.5 06/05/2021 1521   ALBUMIN 4.7 06/05/2021 1521   AST 16 06/05/2021 1521   ALT 9 06/05/2021 1521   ALKPHOS 83 06/05/2021 1521   BILITOT <0.2 06/05/2021 1521   GFRNONAA 82 03/07/2020 1321   GFRAA 94 03/07/2020 1321   No results found for: "CHOL", "HDL", "LDLCALC", "LDLDIRECT", "TRIG", "CHOLHDL" No results found for: "HGBA1C" Lab Results  Component Value Date   VITAMINB12 314 08/23/2013   Lab Results  Component Value Date   TSH 1.130 09/29/2017   ASSESSMENT AND PLAN 51 y.o. year old female   1.  Relapsing remitting multiple sclerosis -Concern for possible exacerbation, worsening gait abnormality, bowel/bladder incontinence -On my exam, no worsening from baseline noted, needs to have xray to ensure stimulator for incontinence is still in place -Check labs, urinalysis to rule out infection -On Ocrevus since fall 2020, last infusion was in June -MRI of the brain with and without contrast and cervical spine in October 2022 was overall stable, no new lesions compared to prior November 2021 -Will hold off on repeating imaging at this time -Encouraged to continue physical therapy exercises  2.  Fatigue -Try Provigil 100 mg daily,  lack of benefit from armodafinil  3.  Chronic migraine headache -Continue current regimen of December 2021 Arnetha Massy, AGNP-C, DNP 10/24/2021, 1:10 PM [facility]  [address] [place], [place] [address] [phone]

## 2021-10-24 NOTE — Telephone Encounter (Signed)
I spoke to the patient. Her symptoms started Sunday, July 1st. She accepted an appt today.

## 2021-10-25 ENCOUNTER — Encounter: Payer: Self-pay | Admitting: Neurology

## 2021-10-25 ENCOUNTER — Telehealth: Payer: Self-pay | Admitting: Neurology

## 2021-10-25 LAB — CBC WITH DIFFERENTIAL/PLATELET
Basophils Absolute: 0 10*3/uL (ref 0.0–0.2)
Basos: 0 %
EOS (ABSOLUTE): 0.1 10*3/uL (ref 0.0–0.4)
Eos: 1 %
Hematocrit: 42.7 % (ref 34.0–46.6)
Hemoglobin: 13.7 g/dL (ref 11.1–15.9)
Immature Grans (Abs): 0 10*3/uL (ref 0.0–0.1)
Immature Granulocytes: 0 %
Lymphocytes Absolute: 1.8 10*3/uL (ref 0.7–3.1)
Lymphs: 30 %
MCH: 26.4 pg — ABNORMAL LOW (ref 26.6–33.0)
MCHC: 32.1 g/dL (ref 31.5–35.7)
MCV: 82 fL (ref 79–97)
Monocytes Absolute: 0.4 10*3/uL (ref 0.1–0.9)
Monocytes: 8 %
Neutrophils Absolute: 3.5 10*3/uL (ref 1.4–7.0)
Neutrophils: 61 %
Platelets: 295 10*3/uL (ref 150–450)
RBC: 5.19 x10E6/uL (ref 3.77–5.28)
RDW: 13.5 % (ref 11.7–15.4)
WBC: 5.8 10*3/uL (ref 3.4–10.8)

## 2021-10-25 LAB — COMPREHENSIVE METABOLIC PANEL
ALT: 12 IU/L (ref 0–32)
AST: 15 IU/L (ref 0–40)
Albumin/Globulin Ratio: 1.6 (ref 1.2–2.2)
Albumin: 4.4 g/dL (ref 3.8–4.9)
Alkaline Phosphatase: 81 IU/L (ref 44–121)
BUN/Creatinine Ratio: 14 (ref 9–23)
BUN: 12 mg/dL (ref 6–24)
Bilirubin Total: 0.2 mg/dL (ref 0.0–1.2)
CO2: 25 mmol/L (ref 20–29)
Calcium: 9.8 mg/dL (ref 8.7–10.2)
Chloride: 101 mmol/L (ref 96–106)
Creatinine, Ser: 0.84 mg/dL (ref 0.57–1.00)
Globulin, Total: 2.8 g/dL (ref 1.5–4.5)
Glucose: 127 mg/dL — ABNORMAL HIGH (ref 70–99)
Potassium: 4 mmol/L (ref 3.5–5.2)
Sodium: 140 mmol/L (ref 134–144)
Total Protein: 7.2 g/dL (ref 6.0–8.5)
eGFR: 83 mL/min/{1.73_m2} (ref >59)

## 2021-10-25 MED ORDER — NITROFURANTOIN MONOHYD MACRO 100 MG PO CAPS
100.0000 mg | ORAL_CAPSULE | Freq: Two times a day (BID) | ORAL | 0 refills | Status: DC
Start: 2021-10-25 — End: 2022-10-08

## 2021-10-25 NOTE — Telephone Encounter (Signed)
I called the patient, labs show UTI. Will treat with Macrobid 100 mg BID x 7 days. Culture is pending. She will let GYN know as well.  Urine sample is positive for nitrites, 1+ leukocytes, many bacteria.  Meds ordered this encounter  Medications   nitrofurantoin, macrocrystal-monohydrate, (MACROBID) 100 MG capsule    Sig: Take 1 capsule (100 mg total) by mouth 2 (two) times daily.    Dispense:  14 capsule    Refill:  0

## 2021-10-27 LAB — URINE CULTURE, REFLEX

## 2021-10-27 LAB — UA/M W/RFLX CULTURE, ROUTINE
Bilirubin, UA: NEGATIVE
Glucose, UA: NEGATIVE
Ketones, UA: NEGATIVE
Nitrite, UA: POSITIVE — AB
Protein,UA: NEGATIVE
Specific Gravity, UA: 1.017 (ref 1.005–1.030)
Urobilinogen, Ur: 0.2 mg/dL (ref 0.2–1.0)
pH, UA: 6.5 (ref 5.0–7.5)

## 2021-10-27 LAB — MICROSCOPIC EXAMINATION: Casts: NONE SEEN /lpf

## 2021-10-29 ENCOUNTER — Telehealth: Payer: Self-pay

## 2021-10-29 NOTE — Telephone Encounter (Signed)
Error

## 2021-11-19 ENCOUNTER — Telehealth: Payer: Self-pay

## 2021-11-19 NOTE — Telephone Encounter (Signed)
Received surgical clearance form from [facility] for 12/20/2021 left knee arthroscopy. Last 2 visits have been with SS, NP. I have placed pw on her desk for review and signature if appropriate. SS, NP will be back in the office 11/20/2021.

## 2021-11-26 ENCOUNTER — Encounter: Payer: Self-pay | Admitting: Neurology

## 2021-12-03 ENCOUNTER — Telehealth: Payer: Self-pay | Admitting: Neurology

## 2021-12-03 DIAGNOSIS — G35 Multiple sclerosis: Secondary | ICD-10-CM

## 2021-12-03 NOTE — Telephone Encounter (Signed)
Please call the patient and ask her to come in for labs on Ocrevus. I will place the orders. Thanks.  Orders Placed This Encounter  Procedures   CBC with Differential/Platelet    Standing Status:   Future    Standing Expiration Date:   12/04/2022   CMP    Standing Status:   Future    Standing Expiration Date:   12/04/2022   IgG, IgA, IgM    Standing Status:   Future    Standing Expiration Date:   12/04/2022

## 2021-12-04 ENCOUNTER — Ambulatory Visit: Payer: BC Managed Care – PPO | Admitting: Neurology

## 2021-12-04 ENCOUNTER — Other Ambulatory Visit (INDEPENDENT_AMBULATORY_CARE_PROVIDER_SITE_OTHER): Payer: Self-pay

## 2021-12-04 DIAGNOSIS — Z0289 Encounter for other administrative examinations: Secondary | ICD-10-CM

## 2021-12-04 DIAGNOSIS — G35 Multiple sclerosis: Secondary | ICD-10-CM

## 2021-12-04 NOTE — Telephone Encounter (Signed)
I spoke with the patient and informed her to come in for lab work. I provided the lab hours. She verbalized understanding and appreciation for the call.

## 2021-12-05 LAB — CBC WITH DIFFERENTIAL/PLATELET
Basophils Absolute: 0 10*3/uL (ref 0.0–0.2)
Basos: 0 %
EOS (ABSOLUTE): 0.1 10*3/uL (ref 0.0–0.4)
Eos: 1 %
Hematocrit: 40 % (ref 34.0–46.6)
Hemoglobin: 12.8 g/dL (ref 11.1–15.9)
Immature Grans (Abs): 0.1 10*3/uL (ref 0.0–0.1)
Immature Granulocytes: 1 %
Lymphocytes Absolute: 2 10*3/uL (ref 0.7–3.1)
Lymphs: 30 %
MCH: 26.5 pg — ABNORMAL LOW (ref 26.6–33.0)
MCHC: 32 g/dL (ref 31.5–35.7)
MCV: 83 fL (ref 79–97)
Monocytes Absolute: 0.5 10*3/uL (ref 0.1–0.9)
Monocytes: 8 %
Neutrophils Absolute: 4 10*3/uL (ref 1.4–7.0)
Neutrophils: 60 %
Platelets: 265 10*3/uL (ref 150–450)
RBC: 4.83 x10E6/uL (ref 3.77–5.28)
RDW: 13.9 % (ref 11.7–15.4)
WBC: 6.6 10*3/uL (ref 3.4–10.8)

## 2021-12-05 LAB — COMPREHENSIVE METABOLIC PANEL
ALT: 10 IU/L (ref 0–32)
AST: 16 IU/L (ref 0–40)
Albumin/Globulin Ratio: 1.8 (ref 1.2–2.2)
Albumin: 4.5 g/dL (ref 3.8–4.9)
Alkaline Phosphatase: 71 IU/L (ref 44–121)
BUN/Creatinine Ratio: 23 (ref 9–23)
BUN: 16 mg/dL (ref 6–24)
Bilirubin Total: 0.2 mg/dL (ref 0.0–1.2)
CO2: 21 mmol/L (ref 20–29)
Calcium: 9.4 mg/dL (ref 8.7–10.2)
Chloride: 106 mmol/L (ref 96–106)
Creatinine, Ser: 0.69 mg/dL (ref 0.57–1.00)
Globulin, Total: 2.5 g/dL (ref 1.5–4.5)
Glucose: 97 mg/dL (ref 70–99)
Potassium: 4.3 mmol/L (ref 3.5–5.2)
Sodium: 143 mmol/L (ref 134–144)
Total Protein: 7 g/dL (ref 6.0–8.5)
eGFR: 104 mL/min/{1.73_m2} (ref >59)

## 2021-12-05 LAB — IGG, IGA, IGM
IgA/Immunoglobulin A, Serum: 139 mg/dL (ref 87–352)
IgG (Immunoglobin G), Serum: 1325 mg/dL (ref 586–1602)
IgM (Immunoglobulin M), Srm: 104 mg/dL (ref 26–217)

## 2021-12-26 ENCOUNTER — Other Ambulatory Visit: Payer: Self-pay | Admitting: Neurology

## 2021-12-30 ENCOUNTER — Other Ambulatory Visit: Payer: Self-pay | Admitting: Neurology

## 2022-01-17 ENCOUNTER — Other Ambulatory Visit: Payer: Self-pay | Admitting: Neurology

## 2022-01-21 ENCOUNTER — Other Ambulatory Visit: Payer: Self-pay | Admitting: Family Medicine

## 2022-01-21 DIAGNOSIS — Z1231 Encounter for screening mammogram for malignant neoplasm of breast: Secondary | ICD-10-CM

## 2022-01-30 ENCOUNTER — Telehealth: Payer: Self-pay | Admitting: Neurology

## 2022-01-30 NOTE — Telephone Encounter (Signed)
I received ophthalmology evaluation from 01/22/2022 with Dr. Katy Fitch. VF/OCT was done, no signs of current or previous episode of optic neuritis.  Ocular pain on the right, no ocular cause for pain found.  Could possibly be related to history of migraine?  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  Prisms not needed since she does not experience double.  Follow-up in 2 to 3 years.

## 2022-02-04 ENCOUNTER — Other Ambulatory Visit: Payer: Self-pay | Admitting: Neurology

## 2022-02-05 ENCOUNTER — Telehealth: Payer: Self-pay

## 2022-02-05 NOTE — Telephone Encounter (Signed)
PA submitted via CMM Key: [id number] Your information has been submitted to [place]. [employer] [place] will review the request and notify you of the determination decision directly, typically within 72 hours of receiving all information

## 2022-02-20 NOTE — Telephone Encounter (Signed)
Approval Effective from 02/05/2022 through 02/04/2023.

## 2022-02-21 ENCOUNTER — Other Ambulatory Visit: Payer: Self-pay | Admitting: Neurology

## 2022-02-21 ENCOUNTER — Encounter: Payer: Self-pay | Admitting: Neurology

## 2022-03-23 ENCOUNTER — Other Ambulatory Visit: Payer: Self-pay | Admitting: Neurology

## 2022-03-26 ENCOUNTER — Encounter: Payer: Self-pay | Admitting: Neurology

## 2022-03-26 NOTE — Telephone Encounter (Signed)
Is this refill appropriate? It was d/c in July.

## 2022-04-05 ENCOUNTER — Ambulatory Visit
Admission: RE | Admit: 2022-04-05 | Discharge: 2022-04-05 | Disposition: A | Discharge status: Home / Self Care | Payer: BC Managed Care – PPO | Source: Ambulatory Visit | Attending: Family Medicine | Admitting: Family Medicine

## 2022-04-05 DIAGNOSIS — Z1231 Encounter for screening mammogram for malignant neoplasm of breast: Secondary | ICD-10-CM

## 2022-04-10 ENCOUNTER — Other Ambulatory Visit: Payer: Self-pay | Admitting: Family Medicine

## 2022-04-10 DIAGNOSIS — R928 Other abnormal and inconclusive findings on diagnostic imaging of breast: Secondary | ICD-10-CM

## 2022-04-30 ENCOUNTER — Other Ambulatory Visit: Payer: Self-pay | Admitting: Family Medicine

## 2022-04-30 ENCOUNTER — Ambulatory Visit
Admission: RE | Admit: 2022-04-30 | Discharge: 2022-04-30 | Disposition: A | Discharge status: Home / Self Care | Payer: BC Managed Care – PPO | Source: Ambulatory Visit | Attending: Family Medicine | Admitting: Family Medicine

## 2022-04-30 DIAGNOSIS — R928 Other abnormal and inconclusive findings on diagnostic imaging of breast: Secondary | ICD-10-CM

## 2022-04-30 DIAGNOSIS — R921 Mammographic calcification found on diagnostic imaging of breast: Secondary | ICD-10-CM

## 2022-05-07 ENCOUNTER — Ambulatory Visit (INDEPENDENT_AMBULATORY_CARE_PROVIDER_SITE_OTHER): Payer: BC Managed Care – PPO | Admitting: Neurology

## 2022-05-07 ENCOUNTER — Encounter: Payer: Self-pay | Admitting: Neurology

## 2022-05-07 VITALS — BP 130/83 | HR 64 | Ht 66.0 in | Wt 195.0 lb

## 2022-05-07 DIAGNOSIS — G35 Multiple sclerosis: Secondary | ICD-10-CM | POA: Diagnosis not present

## 2022-05-07 DIAGNOSIS — R269 Unspecified abnormalities of gait and mobility: Secondary | ICD-10-CM | POA: Diagnosis not present

## 2022-05-07 DIAGNOSIS — G43009 Migraine without aura, not intractable, without status migrainosus: Secondary | ICD-10-CM

## 2022-05-07 MED ORDER — VENLAFAXINE HCL ER 37.5 MG PO CP24: 37.5000 mg | ORAL_CAPSULE | Freq: Every day | ORAL | 11 refills | Status: DC

## 2022-05-07 MED ORDER — UBRELVY 50 MG PO TABS: ORAL_TABLET | ORAL | 11 refills | Status: DC

## 2022-05-07 MED ORDER — AJOVY 225 MG/1.5ML ~~LOC~~ SOAJ: SUBCUTANEOUS | 11 refills | Status: DC

## 2022-05-07 MED ORDER — MODAFINIL 100 MG PO TABS: 100.0000 mg | ORAL_TABLET | Freq: Every day | ORAL | 5 refills | Status: DC

## 2022-05-07 NOTE — Progress Notes (Signed)
Chart reviewed, agree above plan ?

## 2022-05-07 NOTE — Progress Notes (Addendum)
PATIENT: Dana Duke DOB: 1970-09-10  REASON FOR VISIT: Follow up for MS HISTORY FROM: Patient, husband PRIMARY NEUROLOGIST: Dr. Krista Blue  HISTORY  Dana Duke is a 52 year old female, follow-up for relapsing remitting multiple sclerosis, primary care physician is Dr. Glennon Mac, Daleen Bo, MD   Since 2011 she noticed gradual onset slow worsening gait difficulty, she tends to drag her foot across the floor, stiff, has difficulty going upstairs, because of knee pain, she suffered a history of right ankle fracture in 2010, seems to dragging her right foot across the floor more, also complains urinary urgency, no bowel incontinence   MRI at [facility] in May 7th 2015, without contrast   MRI cervical spine (without) demonstrating at least 4 spinal cord T2 hyperintensities at cervico-medullary junction, C2, C3-4 and C6.  MRI brain (without) demonstrating multiple (> 20) round and ovoid, periventricular, callosal and peri-callosal, subcortical, juxtacortical, pontine and cerebellar T2 hyperintensities. Some of these are perpendicular to the lateral ventricles on axial and sagittal views. Some of these are hypointense on T1 views. Findings suspicious for demyelinating disease   CSF showed 5 oligoclonal banding, elevated IgG index   Extensive laboratory evaluations showed normal or negative CBC, CMP, Lyme titer, ANA, NMO, TSH, P10, folic acid, RPR. Positive varicellar zoster antibody   The visual evoked response test above was within normal limits on the left, with prolongation of the P100 latency on the right.    JC virus antibody was 0.15 negative in 10/18/2013   MRI of brain, and cervical spine with contrast, showed no contrast enhancement, there was no lesions noticed in MRI thoracic spine,   She started Plegridy since July 2015,  began full dose injection since Sept 15 2015. Complains of injection site reactio, lasting 2-4 weeks, she only has mild flu like illness     She was started on  prednisone  tapering since October 16,2015 for worsening gait abnormality, fall,  tolerating the medication well, seems to have mild improvement in her gait difficulty   She also complains of frequent headaches, consistent with migraines, previously tried nortriptyline 20 mg at night as preventive medication    Around 2016, she began to noticed increased lower extremity spasticity, gait abnormality, also developed urinary frequency, urgency,    MRI of the brain with and without contrast in November 2016:  abnormal MRI of the brain with and without contrast showing T2/FLAIR hyperintense foci at the cervicomedullary junction and within the pons, midbrain and cerebellum in the posterior fossa. Additionally, there are many T2/FLAIR hyperintense foci in the cerebral hemispheres, predominantly in the periventricular white matter.  The foci are consistent with chronic demyelinating plaques from multiple sclerosis.     None of the foci appears to be acute and there are no enhancing lesions.    abnormal MRI of the cervical spine showing T2 hyperintense foci within the spinal cord at the cervicomedullary junction, C2, C3, C5C6 and C6 as detailed above.   Additional foci are noted within the pons.  None of the foci appears to be acute. No significant degenerative changes are noted.   The lesions are consistent with chronic demyelinating plaques as would be seen in multiple sclerosis. The findings are similar to the MRI report from 08/19/2013.     She started Tysarbri infusion since January 2017,    MRI of the brain without contrast in November 2017:Abnormal MRI scan of the brain with and without contrast showing multiple periventricular, periatrial, subcortical and brainstem white matter hyperintensities consistent with chronic  demyelinating disease. No enhancing lesions are noted. Overall no significant change compared with previous MRI scan dated 03/01/2015   Over the years, she has increased gait abnormality  gait abnormality, failed to respond well to Ampyra,    MRI cervical spine 01/02/2018, demyelinating plaque C2, 3, C5-6 and 7 level, no change compared to MRI in 2016   MRI of the brain with without contrast few small round periventricular subcortical, pontine, and cerebellar demyelinating plaque, no acute findings no change compared to MRI in 2018   Laboratory evaluation showed negative anti-Tysarbri antibody,    Started ocrelizumab since fall of  2020   MRI of the brain without contrast February 12, 2019, multiple T2/flair hyperintensity foci in the brainstem, spinal cord, cerebellum, cerebral hemisphere, in a pattern, and configuration, consistent with chronic demyelinating plaque associated with multiple sclerosis.  There was no change compared to previous MRI in November 2019.  No contrast-enhancement   UPDATE Jan 7th 2021: She came to clinic earlier than expected for worsening migraine headaches, she had 7 days of migraine around Christmas time, had recurrent migraine again over the past 4 days, failed multiple home dose of Imitrex, today she still complains 6 out of 10 right temporal area pounding headache, worsening with movement, light noise sensitivity, with mild nausea   Her headache failed to improve with 50 mg  Ubrevyl sample, but much improved after she was given IV Depacon,,along with 10 mg of Compazine Toradol 30 mg   UPDATE December 05 2020: She had right eye pain, was seen by ophthalmology today, diagnosed was right optic neuritis, but she denies visual change, was giving a steroid Dosepak H,  She still complains of increased migraine headache over the past 2 months, almost daily basis, aimovig monthly does not help,   Continue complains worsening gait abnormality, has worsening bowel and bladder incontinence, is seen by urologist, pelvic physical therapy,   Also complains of increased to fatigue,  Update June 05, 2021 SS: Exhausted from the walk back here, no assistive  device, has to hold on to wall. Completed PT with good benefit, has walker to use. On Ocrevus, due again in May, comes here. MRI of the brain and cervical spine were stable back in October 2022. Few nights ago severe pain to left knee, gets shots in the knee? Has seen orthopedics in the past. Still right eye issues, 3/10 right now. Saw Dr. Dione Booze, reportedly was unremarkable. Was treated with steroids without benefit. Headaches are very good, still on Ajovy, but having to pay $ 400 a month, until reaches deductible, rarely has to take Bernita Raisin, maybe twice a month. Remains on Armodafinil. Has urinary/bowel stimulator placed Nov 2022, 75 % helpful to prevent incontinence.   Update October 24, 2021 SS: Has [employer] stimulator for bowel and bladder incontinence, saw Dr. Tereasa Coop 10/18/21 for increasing fecal incontinence, despite increasing the amplitude.  Had Ocrevus infusion in June. Xray was ordered to ensure the stimulator was the correct place. Went to orthopedics, did PT, was discharged. Couldn't do much for her. Had steroid injections to left knee without benefit. More trouble walking with the left side, due to left knee pain. Feels gets fatigued easily. Ran out of Armodafinil, did not feel that it was helpful.  Her daughter graduated from medical school, doing OB. Feels symptoms worse since July 1st.   Update May 07, 2022 SS: At last visit, urine was positive for UTI treated with Macrobid, no further events. Had arthroscopy to left knee in Sept, no benefit,  2nd opinion from orthopedics, no etiology, got cortisone short, follow up with gel shots, waiting on insurance for approval. Is tired, fatigued. Ocrevus was last week, within few weeks, she is "usually up and running". Has bladder stimulator. Legs feel weaker, hands tingling. No changes to balance, has had few falls. Completed PT in December. Migraines vary, still on Ajovy, use Ubrelvy, mostly helps. Provigil helps, takes occasionally. Wants to wait from  MRI imaging due to cost..  REVIEW OF SYSTEMS: Out of a complete 14 system review of symptoms, the patient complains only of the following symptoms, and all other reviewed systems are negative.  See HPI  ALLERGIES: Allergies  Allergen Reactions   Sulfa Antibiotics Nausea And Vomiting    HOME MEDICATIONS: Outpatient Medications Prior to Visit  Medication Sig Dispense Refill   Ascorbic Acid (VITA-C PO) Take by mouth.     Cholecalciferol 25 MCG (1000 UT) tablet Take 1,000 Units by mouth daily.     diclofenac (VOLTAREN) 75 MG EC tablet Take 75 mg by mouth 2 (two) times daily.     LINZESS 290 MCG CAPS capsule Take 290 mcg by mouth daily.     modafinil (PROVIGIL) 100 MG tablet Take 1 tablet (100 mg total) by mouth daily. 30 tablet 5   nitrofurantoin, macrocrystal-monohydrate, (MACROBID) 100 MG capsule Take 1 capsule (100 mg total) by mouth 2 (two) times daily. 14 capsule 0   ocrelizumab (OCREVUS) 300 MG/10ML injection Inject into the vein.     promethazine (PHENERGAN) 12.5 MG tablet Take 12.5 mg by mouth every 6 (six) hours as needed.     Red Yeast Rice Extract (RED YEAST RICE PO) Take by mouth.     solifenacin (VESICARE) 5 MG tablet Take 5 mg by mouth daily.     DULoxetine (CYMBALTA) 60 MG capsule Take 1 capsule by mouth in the morning 90 capsule 0   Fremanezumab-vfrm (AJOVY) 225 MG/1.5ML SOAJ 1.83ml into the skin once a month 1.5 mL 0   SUMAtriptan (IMITREX) 100 MG tablet Take 1 tablet (100 mg total) by mouth once as needed for up to 1 dose for migraine. May repeat in 2 hours if headache persists or recurs. 12 tablet 11   Ubrogepant (UBRELVY) 50 MG TABS TAKE ONE TABLET BY MOUTH AT ONSET OF MIGRAINE. IF SYMPTOMS PERSIST, A SECOND DOSE MAY BE TAKEN IN 2 HOURS 15 tablet 1   venlafaxine XR (EFFEXOR XR) 75 MG 24 hr capsule Take 1 capsule (75 mg total) by mouth every evening. 90 capsule 3   Facility-Administered Medications Prior to Visit  Medication Dose Route Frequency Provider Last Rate Last  Admin   gadopentetate dimeglumine (MAGNEVIST) injection 20 mL  20 mL Intravenous Once PRN Levert Feinstein, MD        PAST MEDICAL HISTORY: Past Medical History:  Diagnosis Date   Ankle fracture, right    History of hysterectomy    Knee joint dislocation    bilateral   Migraines    Multiple sclerosis (HCC)     PAST SURGICAL HISTORY: Past Surgical History:  Procedure Laterality Date   ABDOMINAL HYSTERECTOMY     CESAREAN SECTION     MYOMECTOMY      FAMILY HISTORY: Family History  Problem Relation Age of Onset   Diabetes Mother    Osteoarthritis Other    Heart disease Other    Rheumatic fever Other    Hypertension Other     SOCIAL HISTORY: Social History   Socioeconomic History   Marital status: Married  Spouse name: Elta Guadeloupe   Number of children: 1   Years of education: college   Highest education level: Not on file  Occupational History    Employer: [employer]    Comment: Does not work  Tobacco Use   Smoking status: Never   Smokeless tobacco: Never  Substance and Sexual Activity   Alcohol use: No   Drug use: No   Sexual activity: Not on file  Other Topics Concern   Not on file  Social History Narrative   Patient is married. Elta Guadeloupe).    Patient is unemployed.   Education- College education   Right handed.   Caffeine- None   Social Determinants of Health   Financial Resource Strain: Not on file  Food Insecurity: Not on file  Transportation Needs: Unmet Transportation Needs (09/19/2021)   PRAPARE - Hydrologist (Medical): Yes    Lack of Transportation (Non-Medical): Yes  Physical Activity: Not on file  Stress: Not on file  Social Connections: Not on file  Intimate Partner Violence: Not on file   PHYSICAL EXAM  Vitals:   05/07/22 1339  BP: 130/83  Pulse: 64  Weight: 195 lb (88.5 kg)  Height: 5\' 6"  (1.676 m)    Body mass index is 31.47 kg/m.  Generalized: Well developed, in no acute distress   Neurological  examination  Mentation: Alert oriented to time, place, history taking. Follows all commands speech and language fluent, very pleasant Cranial nerve II-XII: Pupils were equal round reactive to light. Extraocular movements were full, visual field were full on confrontational test. Facial sensation and strength were normal. Head turning and shoulder shrug were normal and symmetric. Motor: Good strength all extremities, 4/5 left lower extremity, pain to left knee Sensory: Sensory testing is intact to soft touch on all 4 extremities. No evidence of extinction is noted.  Coordination: Cerebellar testing reveals good finger-nose-finger and heel-to-shin bilaterally. But legs are slower to lift Gait and station: Limp on the left due to knee pain, gait is unsteady Reflexes: Deep tendon reflexes are symmetric but increased  DIAGNOSTIC DATA (LABS, IMAGING, TESTING) - I reviewed patient records, labs, notes, testing and imaging myself where available.  Lab Results  Component Value Date   WBC 6.6 12/04/2021   HGB 12.8 12/04/2021   HCT 40.0 12/04/2021   MCV 83 12/04/2021   PLT 265 12/04/2021      Component Value Date/Time   NA 143 12/04/2021 1143   K 4.3 12/04/2021 1143   CL 106 12/04/2021 1143   CO2 21 12/04/2021 1143   GLUCOSE 97 12/04/2021 1143   BUN 16 12/04/2021 1143   CREATININE 0.69 12/04/2021 1143   CALCIUM 9.4 12/04/2021 1143   PROT 7.0 12/04/2021 1143   ALBUMIN 4.5 12/04/2021 1143   AST 16 12/04/2021 1143   ALT 10 12/04/2021 1143   ALKPHOS 71 12/04/2021 1143   BILITOT 0.2 12/04/2021 1143   GFRNONAA 82 03/07/2020 1321   GFRAA 94 03/07/2020 1321   No results found for: "CHOL", "HDL", "LDLCALC", "LDLDIRECT", "TRIG", "CHOLHDL" No results found for: "HGBA1C" Lab Results  Component Value Date   VITAMINB12 314 08/23/2013   Lab Results  Component Value Date   TSH 1.130 09/29/2017   ASSESSMENT AND PLAN 52 y.o. year old female   1.  Relapsing remitting multiple  sclerosis -Remains on Ocrevus since fall 2020, recent infusion last week -Check routine labs today including IgG IgA IgM -Hold off on MRI imaging due to high deductible -  MRI of the brain with and without contrast and cervical spine in October 2022 was overall stable, no new lesions compared to prior November 2021 -Reduce Effexor XR down to 37.5 mg daily, wants to try lower dose -Continues to see orthopedics for chronic left knee pain without etiology, post arthroscopy -She continue practicing PT exercises at home -Follow up in 6 months with Dr. Terrace Arabia -Had to have follow-up ultrasound of the right breast after mammogram, suggestive of probably benign right breast calcifications, repeat mammogram in 6 months  2.  Fatigue -Continue Provigil 100 mg daily, previous lack of benefit from armodafinil, I will send a new prescription -Try Provigil 100 mg daily, lack of benefit from armodafinil  3.  Chronic migraine headache -Continue current regimen of Ashby Dawes -I gave her a sample of Ajovy  Margie Ege, [place], DNP 05/07/2022, 2:14 PM [facility] [address], [address] [place], [place] [address] [phone]

## 2022-05-07 NOTE — Patient Instructions (Signed)
Check labs today  Reduce the Effexor to 37.5 mg daily, watch to see if any worsening pain or mood Continue the Ocrevus See you back in 6 months

## 2022-05-08 LAB — IGG, IGA, IGM
IgA/Immunoglobulin A, Serum: 145 mg/dL (ref 87–352)
IgG (Immunoglobin G), Serum: 1416 mg/dL (ref 586–1602)
IgM (Immunoglobulin M), Srm: 114 mg/dL (ref 26–217)

## 2022-05-08 LAB — COMPREHENSIVE METABOLIC PANEL
ALT: 12 IU/L (ref 0–32)
AST: 12 IU/L (ref 0–40)
Albumin/Globulin Ratio: 1.8 (ref 1.2–2.2)
Albumin: 4.8 g/dL (ref 3.8–4.9)
Alkaline Phosphatase: 74 IU/L (ref 44–121)
BUN/Creatinine Ratio: 20 (ref 9–23)
BUN: 14 mg/dL (ref 6–24)
Bilirubin Total: 0.3 mg/dL (ref 0.0–1.2)
CO2: 23 mmol/L (ref 20–29)
Calcium: 9.8 mg/dL (ref 8.7–10.2)
Chloride: 103 mmol/L (ref 96–106)
Creatinine, Ser: 0.7 mg/dL (ref 0.57–1.00)
Globulin, Total: 2.7 g/dL (ref 1.5–4.5)
Glucose: 74 mg/dL (ref 70–99)
Potassium: 4.4 mmol/L (ref 3.5–5.2)
Sodium: 140 mmol/L (ref 134–144)
Total Protein: 7.5 g/dL (ref 6.0–8.5)
eGFR: 103 mL/min/{1.73_m2} (ref >59)

## 2022-05-08 LAB — CBC WITH DIFFERENTIAL/PLATELET
Basophils Absolute: 0 10*3/uL (ref 0.0–0.2)
Basos: 0 %
EOS (ABSOLUTE): 0.1 10*3/uL (ref 0.0–0.4)
Eos: 1 %
Hematocrit: 41.2 % (ref 34.0–46.6)
Hemoglobin: 13.2 g/dL (ref 11.1–15.9)
Immature Grans (Abs): 0 10*3/uL (ref 0.0–0.1)
Immature Granulocytes: 0 %
Lymphocytes Absolute: 2.2 10*3/uL (ref 0.7–3.1)
Lymphs: 28 %
MCH: 26.5 pg — ABNORMAL LOW (ref 26.6–33.0)
MCHC: 32 g/dL (ref 31.5–35.7)
MCV: 83 fL (ref 79–97)
Monocytes Absolute: 0.6 10*3/uL (ref 0.1–0.9)
Monocytes: 8 %
Neutrophils Absolute: 4.8 10*3/uL (ref 1.4–7.0)
Neutrophils: 63 %
Platelets: 331 10*3/uL (ref 150–450)
RBC: 4.99 x10E6/uL (ref 3.77–5.28)
RDW: 14.4 % (ref 11.7–15.4)
WBC: 7.7 10*3/uL (ref 3.4–10.8)

## 2022-06-10 ENCOUNTER — Telehealth: Payer: Self-pay

## 2022-06-10 ENCOUNTER — Encounter: Payer: Self-pay | Admitting: Neurology

## 2022-06-10 NOTE — Telephone Encounter (Signed)
PA needed for Ajovy. Thanks!

## 2022-06-12 ENCOUNTER — Other Ambulatory Visit (HOSPITAL_COMMUNITY): Payer: Self-pay

## 2022-06-12 NOTE — Telephone Encounter (Signed)
Pharmacy Patient Advocate Encounter   Received notification from [place] that prior authorization for AJOVY (fremanezumab-vfrm) injection '225MG'$ /1.5ML auto-injectors is required/requested.   PA submitted on 06/12/2022 to (ins) [employer] [place] via CoverMyMeds Key [id number] Status is pending

## 2022-06-18 NOTE — Telephone Encounter (Signed)
Pharmacy Patient Advocate Encounter  Prior Authorization for Dana Duke has been approved by [employer] (ins).    PA # PA Case ID #: [id number] Effective dates: 06/17/2022 through 06/14/2023

## 2022-06-18 NOTE — Telephone Encounter (Signed)
Pt has been called and informed. Pt verbalized appreciation for the call.

## 2022-06-20 ENCOUNTER — Other Ambulatory Visit: Payer: Self-pay | Admitting: Neurology

## 2022-06-24 ENCOUNTER — Other Ambulatory Visit: Payer: Self-pay

## 2022-06-26 ENCOUNTER — Other Ambulatory Visit (HOSPITAL_COMMUNITY): Payer: Self-pay | Admitting: Internal Medicine

## 2022-06-26 ENCOUNTER — Encounter (HOSPITAL_COMMUNITY): Payer: Self-pay

## 2022-06-26 ENCOUNTER — Telehealth (HOSPITAL_COMMUNITY): Payer: Self-pay | Admitting: Emergency Medicine

## 2022-06-26 DIAGNOSIS — R9430 Abnormal result of cardiovascular function study, unspecified: Secondary | ICD-10-CM

## 2022-06-26 NOTE — Telephone Encounter (Signed)
Reaching out to patient to offer assistance regarding upcoming cardiac imaging study; pt verbalizes understanding of appt date/time, parking situation and where to check in, pre-test NPO status and medications ordered, and verified current allergies; name and call back number provided for further questions should they arise Dana Bond RN Navigator Cardiac Imaging [facility] [phone] office [phone] cell  Arrival 1100 [address] entrance Denies iv issues Daily meds per usual Aware contrast/nitro No metal to chest/no dresses No caffeine

## 2022-06-27 ENCOUNTER — Ambulatory Visit (HOSPITAL_COMMUNITY)
Admission: RE | Admit: 2022-06-27 | Discharge: 2022-06-27 | Disposition: A | Discharge status: Home / Self Care | Payer: BC Managed Care – PPO | Source: Ambulatory Visit | Attending: Internal Medicine | Admitting: Internal Medicine

## 2022-06-27 DIAGNOSIS — R9430 Abnormal result of cardiovascular function study, unspecified: Secondary | ICD-10-CM | POA: Diagnosis present

## 2022-06-27 MED ORDER — NITROGLYCERIN 0.4 MG SL SUBL
SUBLINGUAL_TABLET | SUBLINGUAL | Status: AC
  Administered 2022-06-27: 0.8 mg via SUBLINGUAL
  Filled 2022-06-27: qty 2

## 2022-06-27 MED ORDER — NITROGLYCERIN 0.4 MG SL SUBL
SUBLINGUAL_TABLET | SUBLINGUAL | Status: AC
  Filled 2022-06-27: qty 2

## 2022-06-27 MED ORDER — NITROGLYCERIN 0.4 MG SL SUBL
0.8000 mg | SUBLINGUAL_TABLET | Freq: Once | SUBLINGUAL | Status: AC
  Administered 2022-06-27: 0.8 mg via SUBLINGUAL

## 2022-06-27 MED ORDER — IOHEXOL 350 MG/ML SOLN
100.0000 mL | Freq: Once | INTRAVENOUS | Status: AC | PRN
  Administered 2022-06-27: 100 mL via INTRAVENOUS

## 2022-07-08 ENCOUNTER — Encounter: Payer: Self-pay | Admitting: Neurology

## 2022-09-11 IMAGING — MG DIGITAL SCREENING BILAT W/ TOMO W/ CAD
8 series · 8 of 24 positions shown · non-contrast
Comparison: Previous exam(s).

CLINICAL DATA: Screening.

EXAM:
DIGITAL SCREENING BILATERAL MAMMOGRAM WITH TOMO AND CAD

[L CC synth-2D]
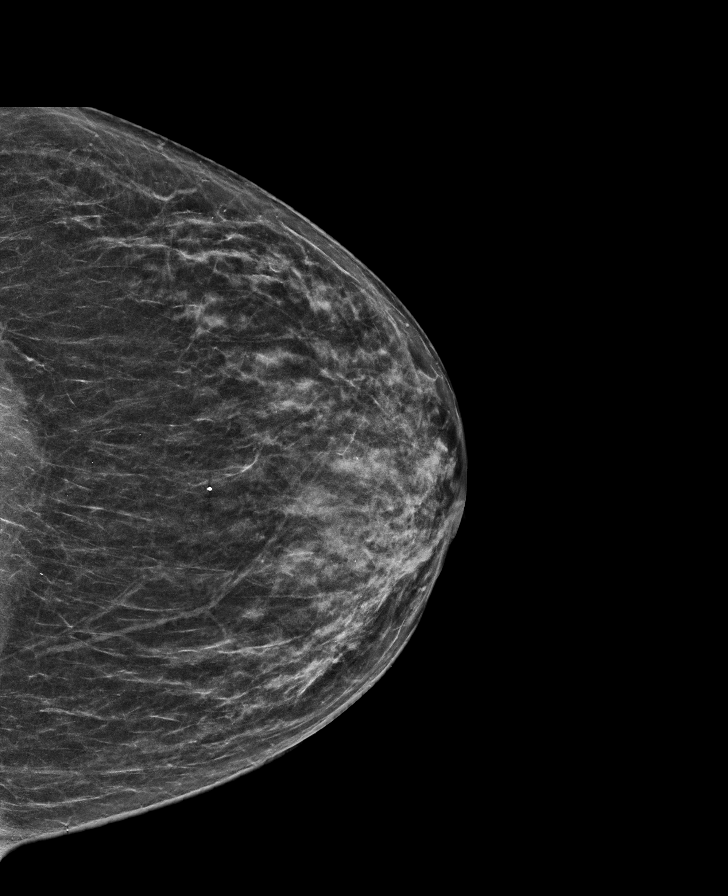

[L MLO synth-2D]
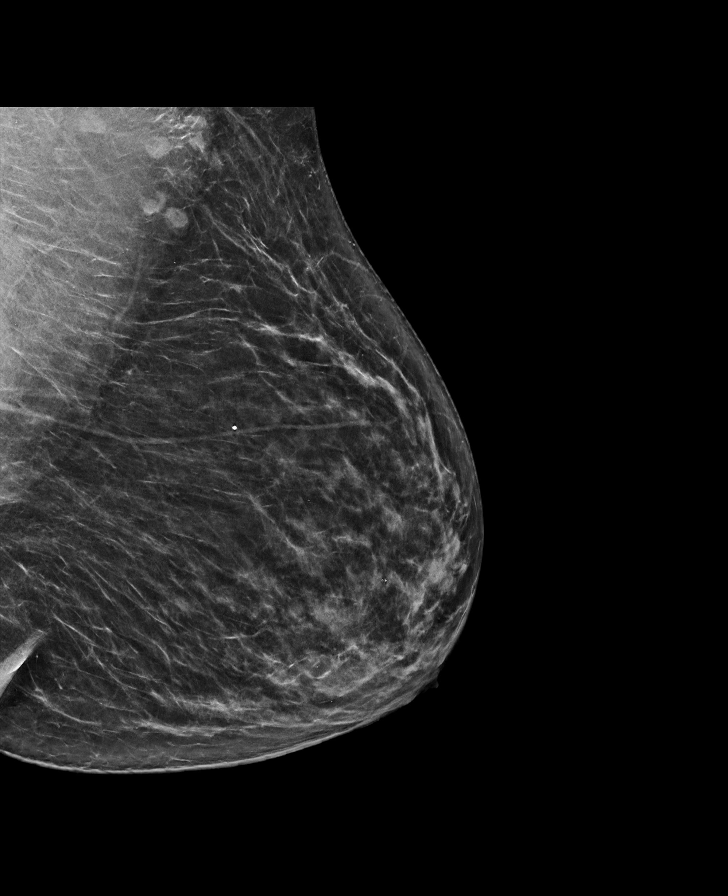

[R MLO synth-2D]
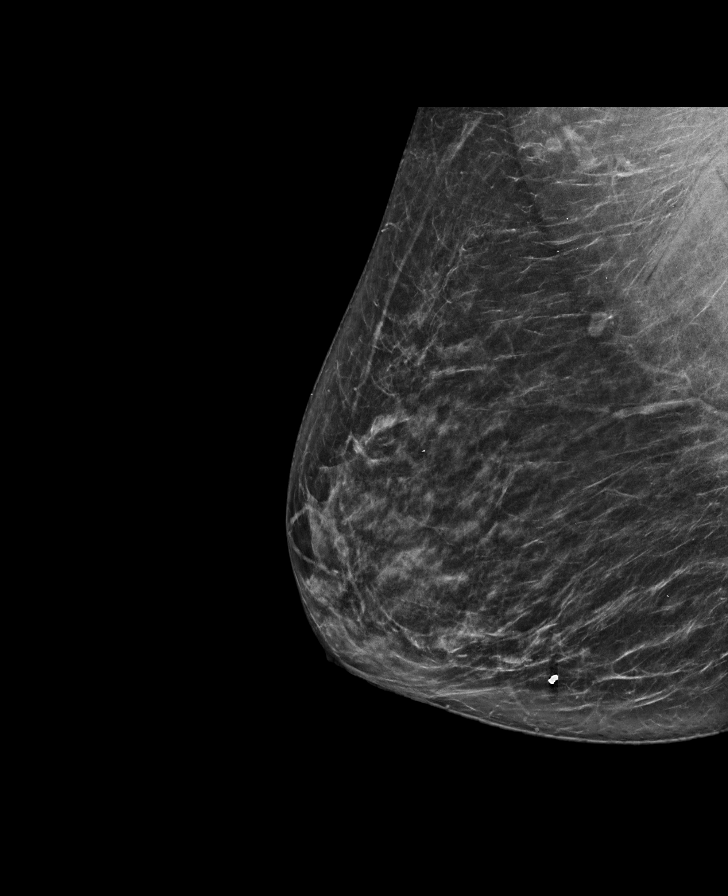

[R CC synth-2D]
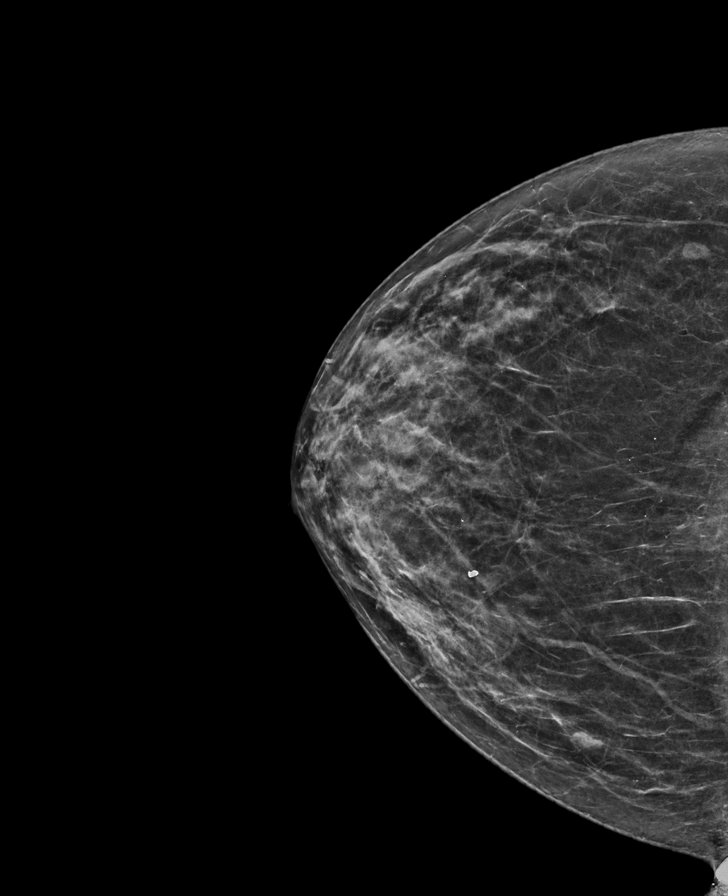

[R MLO tomo · tomo slice 33/66.0]
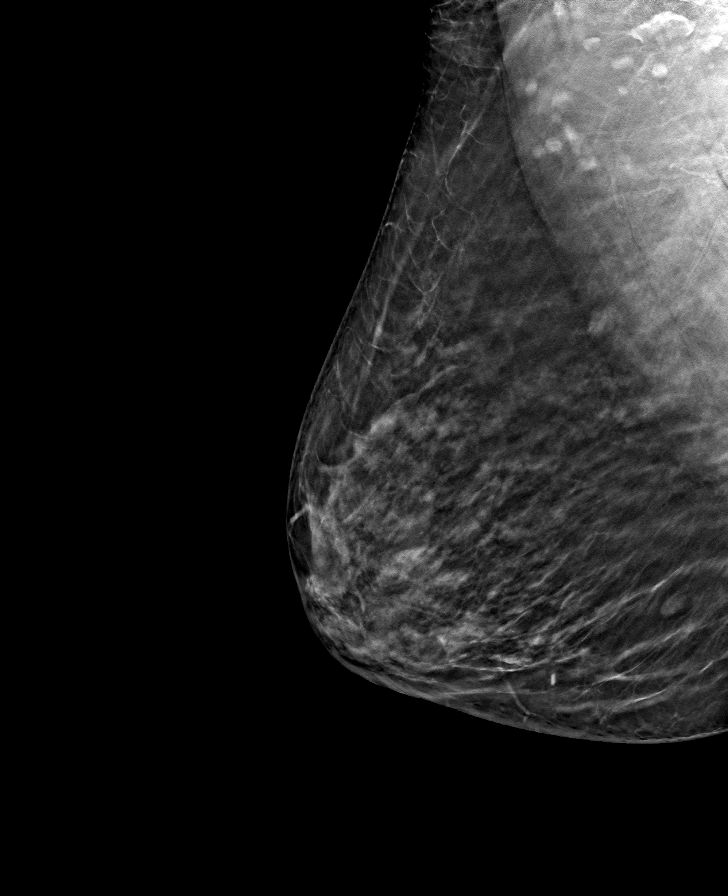

[L MLO tomo · tomo slice 36/71.0]
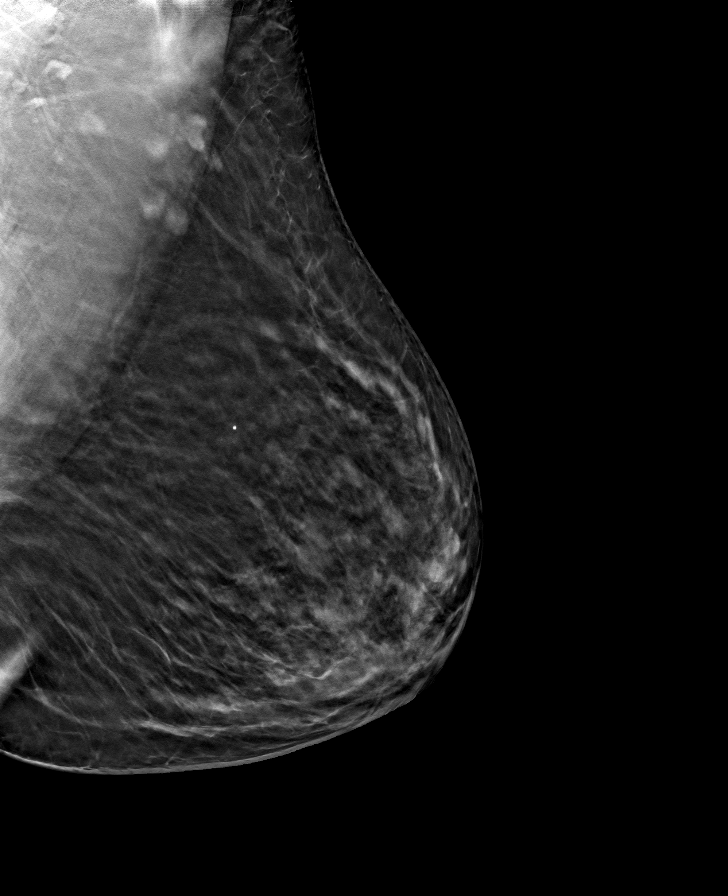

[R CC tomo · tomo slice 31/61.0]
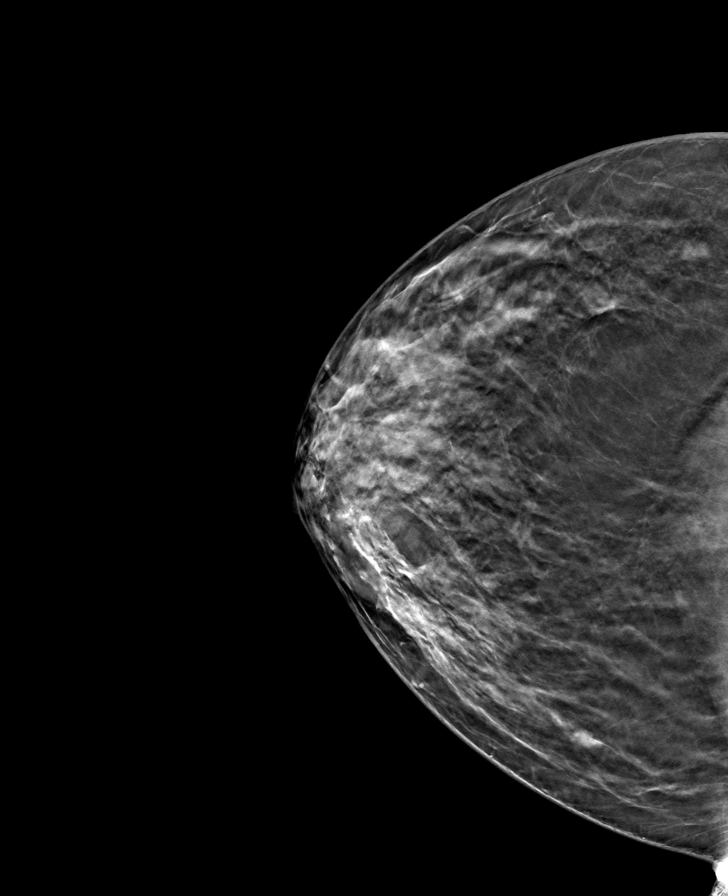

[L CC tomo · tomo slice 31/62.0]
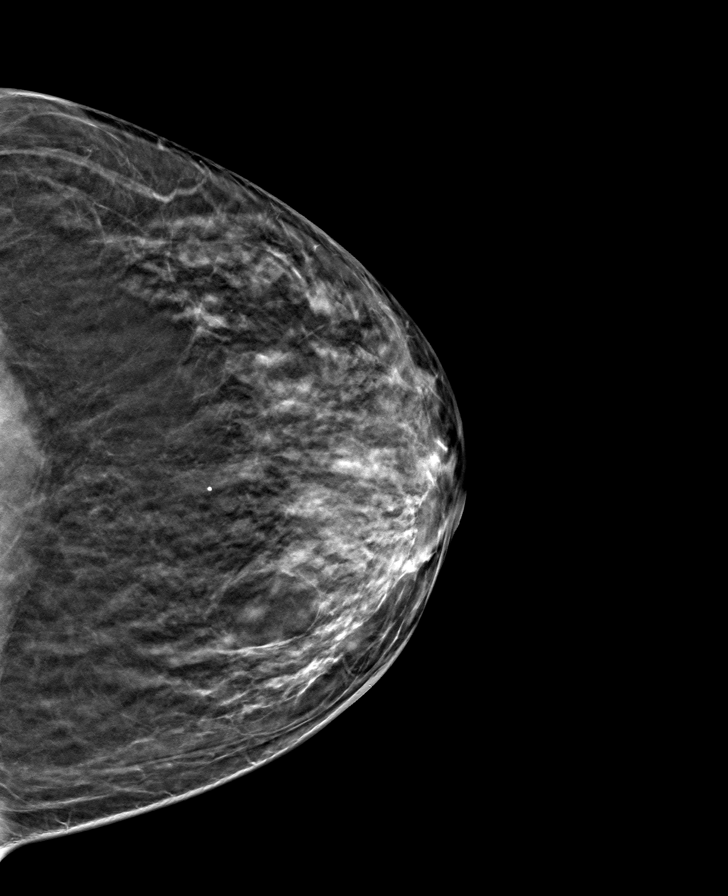

[8 of 24 positions shown; findings below may reference images not displayed]

ACR Breast Density Category c: The breast tissue is heterogeneously
dense, which may obscure small masses.
FINDINGS: In the right breast, a possible mass warrants further evaluation. In
the left breast, no findings suspicious for malignancy. Images were
processed with CAD.
IMPRESSION: Further evaluation is suggested for a possible mass in the right
breast.

RECOMMENDATION:
Diagnostic mammogram and possibly ultrasound of the right breast.
(Code:WG-2-99N)

The patient will be contacted regarding the findings, and additional
imaging will be scheduled.

BI-RADS CATEGORY  0: Incomplete. Need additional imaging evaluation
and/or prior mammograms for comparison.

## 2022-10-08 ENCOUNTER — Ambulatory Visit (INDEPENDENT_AMBULATORY_CARE_PROVIDER_SITE_OTHER): Payer: BC Managed Care – PPO | Admitting: Neurology

## 2022-10-08 ENCOUNTER — Encounter: Payer: Self-pay | Admitting: Neurology

## 2022-10-08 VITALS — BP 134/90 | HR 66 | Ht 66.0 in | Wt 198.0 lb

## 2022-10-08 DIAGNOSIS — R5383 Other fatigue: Secondary | ICD-10-CM | POA: Diagnosis not present

## 2022-10-08 DIAGNOSIS — R269 Unspecified abnormalities of gait and mobility: Secondary | ICD-10-CM | POA: Diagnosis not present

## 2022-10-08 DIAGNOSIS — G43009 Migraine without aura, not intractable, without status migrainosus: Secondary | ICD-10-CM | POA: Diagnosis not present

## 2022-10-08 DIAGNOSIS — G35 Multiple sclerosis: Secondary | ICD-10-CM | POA: Diagnosis not present

## 2022-10-08 DIAGNOSIS — Z79899 Other long term (current) drug therapy: Secondary | ICD-10-CM | POA: Insufficient documentation

## 2022-10-08 MED ORDER — MODAFINIL 100 MG PO TABS: 100.0000 mg | ORAL_TABLET | Freq: Every day | ORAL | 3 refills | Status: DC

## 2022-10-08 NOTE — Progress Notes (Signed)
ASSESSMENT AND PLAN 52 y.o. year old female   Relapsing remitting multiple sclerosis -Remains on Ocrevus since fall 2020, most recent infusion was in January 2024, Within normal range IgG IgA IgM -MRI of the brain with and without contrast and cervical spine in October 2022 was overall stable, no new lesions compared to prior November 2021, extensive spinal cord involvement -ordered repeat MRI brain and cervical w/wo Tolerating lower dose of Effexor XR down to 37.5 mg daily, may continue weaning off if not needed   Fatigue Refilled her Provigil 100 mg daily,    Chronic migraine headache Doing very well on current regimen of Ajovy, Ubrelvy     DIAGNOSTIC DATA (LABS, IMAGING, TESTING) - I reviewed patient records, labs, notes, testing and imaging myself where available.    HISTORY  Dana Duke is a 52 year old female, follow-up for relapsing remitting multiple sclerosis, primary care physician is Dr. Jean Rosenthal, Caroline More, MD   Since 2011 she noticed gradual onset slow worsening gait difficulty, she tends to drag her foot across the floor, stiff, has difficulty going upstairs, because of knee pain, she suffered a history of right ankle fracture in 2010, seems to dragging her right foot across the floor more, also complains urinary urgency, no bowel incontinence   MRI at [facility] in May 7th 2015, without contrast   MRI cervical spine (without) demonstrating at least 4 spinal cord T2 hyperintensities at cervico-medullary junction, C2, C3-4 and C6.  MRI brain (without) demonstrating multiple (> 20) round and ovoid, periventricular, callosal and peri-callosal, subcortical, juxtacortical, pontine and cerebellar T2 hyperintensities. Some of these are perpendicular to the lateral ventricles on axial and sagittal views. Some of these are hypointense on T1 views. Findings suspicious for demyelinating disease   CSF showed 5 oligoclonal banding, elevated IgG index   Extensive laboratory  evaluations showed normal or negative CBC, CMP, Lyme titer, ANA, NMO, TSH, B12, folic acid, RPR. Positive varicellar zoster antibody   The visual evoked response test above was within normal limits on the left, with prolongation of the P100 latency on the right.    JC virus antibody was 0.15 negative in 10/18/2013   MRI of brain, and cervical spine with contrast, showed no contrast enhancement, there was no lesions noticed in MRI thoracic spine,   She started Plegridy since July 2015,  began full dose injection since Sept 15 2015. Complains of injection site reactio, lasting 2-4 weeks, she only has mild flu like illness     She was started on prednisone  tapering since October 16,2015 for worsening gait abnormality, fall,  tolerating the medication well, seems to have mild improvement in her gait difficulty   She also complains of frequent headaches, consistent with migraines, previously tried nortriptyline 20 mg at night as preventive medication    Around 2016, she began to noticed increased lower extremity spasticity, gait abnormality, also developed urinary frequency, urgency,    MRI of the brain with and without contrast in November 2016:  abnormal MRI of the brain with and without contrast showing T2/FLAIR hyperintense foci at the cervicomedullary junction and within the pons, midbrain and cerebellum in the posterior fossa. Additionally, there are many T2/FLAIR hyperintense foci in the cerebral hemispheres, predominantly in the periventricular white matter.  The foci are consistent with chronic demyelinating plaques from multiple sclerosis.     None of the foci appears to be acute and there are no enhancing lesions.    abnormal MRI of the cervical spine showing T2 hyperintense  foci within the spinal cord at the cervicomedullary junction, C2, C3, C5C6 and C6 as detailed above.   Additional foci are noted within the pons.  None of the foci appears to be acute. No significant degenerative changes  are noted.   The lesions are consistent with chronic demyelinating plaques as would be seen in multiple sclerosis. The findings are similar to the MRI report from 08/19/2013.     She started Tysarbri infusion since January 2017,    MRI of the brain without contrast in November 2017:Abnormal MRI scan of the brain with and without contrast showing multiple periventricular, periatrial, subcortical and brainstem white matter hyperintensities consistent with chronic demyelinating disease. No enhancing lesions are noted. Overall no significant change compared with previous MRI scan dated 03/01/2015   Over the years, she has increased gait abnormality gait abnormality, failed to respond well to Ampyra,    MRI cervical spine 01/02/2018, demyelinating plaque C2, 3, C5-6 and 7 level, no change compared to MRI in 2016   MRI of the brain with without contrast few small round periventricular subcortical, pontine, and cerebellar demyelinating plaque, no acute findings no change compared to MRI in 2018   Laboratory evaluation showed negative anti-Tysarbri antibody,    Started ocrelizumab since fall of  2020   MRI of the brain without contrast February 12, 2019, multiple T2/flair hyperintensity foci in the brainstem, spinal cord, cerebellum, cerebral hemisphere, in a pattern, and configuration, consistent with chronic demyelinating plaque associated with multiple sclerosis.  There was no change compared to previous MRI in November 2019.  No contrast-enhancement   UPDATE Jan 7th 2021: She came to clinic earlier than expected for worsening migraine headaches, she had 7 days of migraine around Christmas time, had recurrent migraine again over the past 4 days, failed multiple home dose of Imitrex, today she still complains 6 out of 10 right temporal area pounding headache, worsening with movement, light noise sensitivity, with mild nausea   Her headache failed to improve with 50 mg  Ubrevyl sample, but much improved  after she was given IV Depacon,,along with 10 mg of Compazine Toradol 30 mg   UPDATE December 05 2020: She had right eye pain, was seen by ophthalmology today, diagnosed was right optic neuritis, but she denies visual change, was giving a steroid Dosepak H,  She still complains of increased migraine headache over the past 2 months, almost daily basis, aimovig monthly does not help,   Continue complains worsening gait abnormality, has worsening bowel and bladder incontinence, is seen by urologist, pelvic physical therapy,   Also complains of increased to fatigue,  UPDATE October 08 2022: She is accompanied by her husband at today's visit, overall stable, gait abnormality but no worsening, recently reviewed MRI of the brain and cervical spine from 2022, extensive cervical cord involvement, stable,  Tolerating ocrelizumab infusion, last infusion was in January 2024,  US abdomen in June 2024 for evaluation of right abdominal pain 1.No acute intra-abdominal pathology.  2.  Hepatic steatosis.  3.  Benign-appearing subcentimeter right renal cyst.      PHYSICAL EXAM  Vitals:   10/08/22 1500 10/08/22 1508  BP: (!) 145/92 (!) 134/90  Pulse: 68 66  Weight: 198 lb (89.8 kg)   Height: 5\' 6"  (1.676 m)     Body mass index is 31.96 kg/m.   PHYSICAL EXAMNIATION:  Gen: NAD, conversant, well nourised, well groomed  Cardiovascular: Regular rate rhythm, no peripheral edema, warm, nontender. Eyes: Conjunctivae clear without exudates or hemorrhage Neck: Supple, no carotid bruits. Pulmonary: Clear to auscultation bilaterally   NEUROLOGICAL EXAM:  MENTAL STATUS: Speech/cognition: Awake, alert oriented to history taking and casual conversation  CRANIAL NERVES: CN II: Visual fields are full to confrontation.  Pupils are round equal and briskly reactive to light. CN III, IV, VI: extraocular movement are normal. No ptosis. CN V: Facial sensation is intact to pinprick in all 3  divisions bilaterally. Corneal responses are intact.  CN VII: Face is symmetric with normal eye closure and smile. CN VIII: Hearing is normal to casual conversation CN IX, X: Palate elevates symmetrically. Phonation is normal. CN XI: Head turning and shoulder shrug are intact   MOTOR: Mild to moderate spasticity of lower extremity  REFLEXES: Reflexes are 2 and symmetric at the biceps, triceps, knees, and ankles. Plantar responses are flexor.  SENSORY: Intact to light touch, pinprick, positional and vibratory sensation are intact in fingers and toes.  COORDINATION: Rapid alternating movements and fine finger movements are intact. There is no dysmetria on finger-to-nose and heel-knee-shin.    GAIT/STANCE: Push-up to get up from seated position, stiff, dragging bilateral leg,    REVIEW OF SYSTEMS: Out of a complete 14 system review of symptoms, the patient complains only of the following symptoms, and all other reviewed systems are negative.  See HPI  ALLERGIES: Allergies  Allergen Reactions   Sulpiride Nausea Only    Other Reaction(s): vomiting   Sulfa Antibiotics Nausea And Vomiting   Sulfacetamide Sodium     Other Reaction(s): GI Intolerance    HOME MEDICATIONS: Outpatient Medications Prior to Visit  Medication Sig Dispense Refill   Ascorbic Acid (VITA-C PO) Take by mouth.     celecoxib (CELEBREX) 200 MG capsule Take 200 mg by mouth daily.     Cholecalciferol 25 MCG (1000 UT) tablet Take 1,000 Units by mouth daily.     Fremanezumab-vfrm (AJOVY) 225 MG/1.5ML SOAJ 1.2ml into the skin once a month 1.5 mL 11   methenamine (HIPREX) 1 g tablet Take 1 g by mouth 2 (two) times daily.     modafinil (PROVIGIL) 100 MG tablet Take 1 tablet (100 mg total) by mouth daily. 30 tablet 5   ocrelizumab (OCREVUS) 300 MG/10ML injection Inject into the vein.     promethazine (PHENERGAN) 12.5 MG tablet Take 12.5 mg by mouth every 6 (six) hours as needed.     Red Yeast Rice Extract (RED YEAST  RICE PO) Take by mouth.     Ubrogepant (UBRELVY) 50 MG TABS TAKE ONE TABLET BY MOUTH AT ONSET OF MIGRAINE. IF SYMPTOMS PERSIST, A SECOND DOSE MAY BE TAKEN IN 2 HOURS 15 tablet 11   venlafaxine XR (EFFEXOR XR) 37.5 MG 24 hr capsule Take 1 capsule (37.5 mg total) by mouth daily with breakfast. 30 capsule 11   diclofenac (VOLTAREN) 75 MG EC tablet Take 75 mg by mouth 2 (two) times daily.     LINZESS 290 MCG CAPS capsule Take 290 mcg by mouth daily. (Patient not taking: Reported on 06/27/2022)     nitrofurantoin, macrocrystal-monohydrate, (MACROBID) 100 MG capsule Take 1 capsule (100 mg total) by mouth 2 (two) times daily. 14 capsule 0   solifenacin (VESICARE) 5 MG tablet Take 5 mg by mouth daily.     Facility-Administered Medications Prior to Visit  Medication Dose Route Frequency Provider Last Rate Last Admin   gadopentetate dimeglumine (MAGNEVIST) injection 20 mL  20 mL  Intravenous Once PRN Levert Feinstein, MD        PAST MEDICAL HISTORY: Past Medical History:  Diagnosis Date   Ankle fracture, right    History of hysterectomy    Knee joint dislocation    bilateral   Migraines    Multiple sclerosis (HCC)     PAST SURGICAL HISTORY: Past Surgical History:  Procedure Laterality Date   ABDOMINAL HYSTERECTOMY     CESAREAN SECTION     MYOMECTOMY      FAMILY HISTORY: Family History  Problem Relation Age of Onset   Diabetes Mother    Osteoarthritis Other    Heart disease Other    Rheumatic fever Other    Hypertension Other     SOCIAL HISTORY: Social History   Socioeconomic History   Marital status: Married    Spouse name: Merchant navy officer   Number of children: 1   Years of education: college   Highest education level: Not on file  Occupational History    Employer: [employer]    Comment: Does not work  Tobacco Use   Smoking status: Never   Smokeless tobacco: Never  Vaping Use   Vaping Use: Never used  Substance and Sexual Activity   Alcohol use: No   Drug use: No   Sexual  activity: Yes    Birth control/protection: Surgical  Other Topics Concern   Not on file  Social History Narrative   Patient is married. [name]).    Patient is unemployed.   Education- College education   Right handed.   Caffeine- None   Social Determinants of Health   Financial Resource Strain: Not on file  Food Insecurity: Not on file  Transportation Needs: Unmet Transportation Needs (09/19/2021)   PRAPARE - Administrator, Civil Service (Medical): Yes    Lack of Transportation (Non-Medical): Yes  Physical Activity: Not on file  Stress: Not on file  Social Connections: Not on file  Intimate Partner Violence: Not on file   Levert Feinstein, M.D. Ph.D.  [facility] [address] [place], [place] [address] Phone: [phone] Fax:      [phone]

## 2022-10-09 ENCOUNTER — Telehealth: Payer: Self-pay | Admitting: Neurology

## 2022-10-09 ENCOUNTER — Encounter: Payer: Self-pay | Admitting: Neurology

## 2022-10-09 NOTE — Telephone Encounter (Signed)
Pt scheduled for 90 mins MR brain & cervical spine w/wo contrast at [facility] for 10/22/22 at [facility] auth# [id number] (10/09/22-11/07/22)

## 2022-10-11 LAB — COMPREHENSIVE METABOLIC PANEL
ALT: 11 IU/L (ref 0–32)
AST: 16 IU/L (ref 0–40)
Albumin: 4.6 g/dL (ref 3.8–4.9)
Alkaline Phosphatase: 73 IU/L (ref 44–121)
BUN/Creatinine Ratio: 19 (ref 9–23)
BUN: 15 mg/dL (ref 6–24)
Bilirubin Total: 0.2 mg/dL (ref 0.0–1.2)
CO2: 25 mmol/L (ref 20–29)
Calcium: 9.6 mg/dL (ref 8.7–10.2)
Chloride: 101 mmol/L (ref 96–106)
Creatinine, Ser: 0.79 mg/dL (ref 0.57–1.00)
Globulin, Total: 3 g/dL (ref 1.5–4.5)
Glucose: 90 mg/dL (ref 70–99)
Potassium: 4.5 mmol/L (ref 3.5–5.2)
Sodium: 141 mmol/L (ref 134–144)
Total Protein: 7.6 g/dL (ref 6.0–8.5)
eGFR: 89 mL/min/{1.73_m2} (ref >59)

## 2022-10-11 LAB — CBC WITH DIFFERENTIAL/PLATELET
Basophils Absolute: 0 10*3/uL (ref 0.0–0.2)
Basos: 0 %
EOS (ABSOLUTE): 0.1 10*3/uL (ref 0.0–0.4)
Eos: 1 %
Hematocrit: 41.8 % (ref 34.0–46.6)
Hemoglobin: 13.3 g/dL (ref 11.1–15.9)
Immature Grans (Abs): 0 10*3/uL (ref 0.0–0.1)
Immature Granulocytes: 0 %
Lymphocytes Absolute: 1.8 10*3/uL (ref 0.7–3.1)
Lymphs: 30 %
MCH: 26.5 pg — ABNORMAL LOW (ref 26.6–33.0)
MCHC: 31.8 g/dL (ref 31.5–35.7)
MCV: 83 fL (ref 79–97)
Monocytes Absolute: 0.5 10*3/uL (ref 0.1–0.9)
Monocytes: 9 %
Neutrophils Absolute: 3.5 10*3/uL (ref 1.4–7.0)
Neutrophils: 60 %
Platelets: 297 10*3/uL (ref 150–450)
RBC: 5.02 x10E6/uL (ref 3.77–5.28)
RDW: 13.7 % (ref 11.7–15.4)
WBC: 5.9 10*3/uL (ref 3.4–10.8)

## 2022-10-11 LAB — TSH: TSH: 1.22 u[IU]/mL (ref 0.450–4.500)

## 2022-10-11 LAB — CD20 B CELLS
% CD19-B Cells: 0 % — ABNORMAL LOW (ref 4.6–22.1)
% CD20-B Cells: 0 % — ABNORMAL LOW (ref 5.0–22.3)

## 2022-10-22 ENCOUNTER — Ambulatory Visit (INDEPENDENT_AMBULATORY_CARE_PROVIDER_SITE_OTHER): Payer: BC Managed Care – PPO

## 2022-10-22 DIAGNOSIS — Z79899 Other long term (current) drug therapy: Secondary | ICD-10-CM

## 2022-10-22 DIAGNOSIS — G43009 Migraine without aura, not intractable, without status migrainosus: Secondary | ICD-10-CM | POA: Diagnosis not present

## 2022-10-22 DIAGNOSIS — R5383 Other fatigue: Secondary | ICD-10-CM | POA: Diagnosis not present

## 2022-10-22 DIAGNOSIS — G35 Multiple sclerosis: Secondary | ICD-10-CM

## 2022-10-22 DIAGNOSIS — R269 Unspecified abnormalities of gait and mobility: Secondary | ICD-10-CM | POA: Diagnosis not present

## 2022-10-22 MED ORDER — GADOBENATE DIMEGLUMINE 529 MG/ML IV SOLN
20.0000 mL | Freq: Once | INTRAVENOUS | Status: AC | PRN
  Administered 2022-10-22: 20 mL via INTRAVENOUS

## 2022-10-30 ENCOUNTER — Other Ambulatory Visit: Payer: Self-pay | Admitting: Family Medicine

## 2022-10-30 ENCOUNTER — Ambulatory Visit
Admission: RE | Admit: 2022-10-30 | Discharge: 2022-10-30 | Disposition: A | Discharge status: Home / Self Care | Payer: BC Managed Care – PPO | Source: Ambulatory Visit | Attending: Family Medicine | Admitting: Family Medicine

## 2022-10-30 DIAGNOSIS — R921 Mammographic calcification found on diagnostic imaging of breast: Secondary | ICD-10-CM

## 2022-11-05 ENCOUNTER — Ambulatory Visit: Payer: BC Managed Care – PPO | Admitting: Neurology

## 2023-01-23 ENCOUNTER — Other Ambulatory Visit: Payer: Self-pay | Admitting: Neurology

## 2023-01-28 NOTE — Telephone Encounter (Signed)
Rx refilled per last office visit note.

## 2023-02-02 ENCOUNTER — Encounter: Payer: Self-pay | Admitting: Neurology

## 2023-02-03 ENCOUNTER — Telehealth: Payer: Self-pay | Admitting: Neurology

## 2023-02-03 ENCOUNTER — Inpatient Hospital Stay (HOSPITAL_COMMUNITY)
Admission: EM | Admit: 2023-02-03 | Discharge: 2023-02-07 | DRG: 690 | Disposition: A | Discharge status: Home / Self Care | Payer: BC Managed Care – PPO | Attending: Internal Medicine | Admitting: Internal Medicine

## 2023-02-03 ENCOUNTER — Encounter (HOSPITAL_COMMUNITY): Payer: Self-pay

## 2023-02-03 ENCOUNTER — Other Ambulatory Visit: Payer: Self-pay

## 2023-02-03 DIAGNOSIS — E860 Dehydration: Secondary | ICD-10-CM | POA: Diagnosis present

## 2023-02-03 DIAGNOSIS — N390 Urinary tract infection, site not specified: Secondary | ICD-10-CM | POA: Diagnosis present

## 2023-02-03 DIAGNOSIS — F32A Depression, unspecified: Secondary | ICD-10-CM | POA: Diagnosis present

## 2023-02-03 DIAGNOSIS — Z1152 Encounter for screening for COVID-19: Secondary | ICD-10-CM

## 2023-02-03 DIAGNOSIS — F419 Anxiety disorder, unspecified: Secondary | ICD-10-CM | POA: Diagnosis present

## 2023-02-03 DIAGNOSIS — R159 Full incontinence of feces: Secondary | ICD-10-CM | POA: Diagnosis present

## 2023-02-03 DIAGNOSIS — Z833 Family history of diabetes mellitus: Secondary | ICD-10-CM

## 2023-02-03 DIAGNOSIS — E1165 Type 2 diabetes mellitus with hyperglycemia: Secondary | ICD-10-CM | POA: Diagnosis present

## 2023-02-03 DIAGNOSIS — B9620 Unspecified Escherichia coli [E. coli] as the cause of diseases classified elsewhere: Secondary | ICD-10-CM | POA: Diagnosis present

## 2023-02-03 DIAGNOSIS — R531 Weakness: Secondary | ICD-10-CM

## 2023-02-03 DIAGNOSIS — R7881 Bacteremia: Secondary | ICD-10-CM | POA: Diagnosis present

## 2023-02-03 DIAGNOSIS — E669 Obesity, unspecified: Secondary | ICD-10-CM | POA: Diagnosis present

## 2023-02-03 DIAGNOSIS — Z79899 Other long term (current) drug therapy: Secondary | ICD-10-CM | POA: Diagnosis not present

## 2023-02-03 DIAGNOSIS — Z993 Dependence on wheelchair: Secondary | ICD-10-CM | POA: Diagnosis not present

## 2023-02-03 DIAGNOSIS — Z882 Allergy status to sulfonamides status: Secondary | ICD-10-CM

## 2023-02-03 DIAGNOSIS — Z6831 Body mass index (BMI) 31.0-31.9, adult: Secondary | ICD-10-CM

## 2023-02-03 DIAGNOSIS — R32 Unspecified urinary incontinence: Secondary | ICD-10-CM | POA: Diagnosis present

## 2023-02-03 DIAGNOSIS — G35 Multiple sclerosis: Secondary | ICD-10-CM | POA: Diagnosis present

## 2023-02-03 DIAGNOSIS — Z8249 Family history of ischemic heart disease and other diseases of the circulatory system: Secondary | ICD-10-CM

## 2023-02-03 LAB — COMPREHENSIVE METABOLIC PANEL
ALT: 33 U/L (ref 0–44)
AST: 27 U/L (ref 15–41)
Albumin: 3.5 g/dL (ref 3.5–5.0)
Alkaline Phosphatase: 57 U/L (ref 38–126)
Anion gap: 9 (ref 5–15)
BUN: 10 mg/dL (ref 6–20)
CO2: 24 mmol/L (ref 22–32)
Calcium: 8.4 mg/dL — ABNORMAL LOW (ref 8.9–10.3)
Chloride: 106 mmol/L (ref 98–111)
Creatinine, Ser: 0.93 mg/dL (ref 0.44–1.00)
GFR, Estimated: >60 mL/min (ref >60)
Glucose, Bld: 192 mg/dL — ABNORMAL HIGH (ref 70–99)
Potassium: 3.9 mmol/L (ref 3.5–5.1)
Sodium: 139 mmol/L (ref 135–145)
Total Bilirubin: 0.8 mg/dL (ref 0.3–1.2)
Total Protein: 7 g/dL (ref 6.5–8.1)

## 2023-02-03 LAB — CBC WITH DIFFERENTIAL/PLATELET
Abs Immature Granulocytes: 0.04 10*3/uL (ref 0.00–0.07)
Basophils Absolute: 0 10*3/uL (ref 0.0–0.1)
Basophils Relative: 0 %
Eosinophils Absolute: 0 10*3/uL (ref 0.0–0.5)
Eosinophils Relative: 0 %
HCT: 38.7 % (ref 36.0–46.0)
Hemoglobin: 12.4 g/dL (ref 12.0–15.0)
Immature Granulocytes: 0 %
Lymphocytes Relative: 7 %
Lymphs Abs: 0.7 10*3/uL (ref 0.7–4.0)
MCH: 26.7 pg (ref 26.0–34.0)
MCHC: 32 g/dL (ref 30.0–36.0)
MCV: 83.4 fL (ref 80.0–100.0)
Monocytes Absolute: 1 10*3/uL (ref 0.1–1.0)
Monocytes Relative: 10 %
Neutro Abs: 8 10*3/uL — ABNORMAL HIGH (ref 1.7–7.7)
Neutrophils Relative %: 83 %
Platelets: 278 10*3/uL (ref 150–400)
RBC: 4.64 MIL/uL (ref 3.87–5.11)
RDW: 14.8 % (ref 11.5–15.5)
WBC: 9.8 10*3/uL (ref 4.0–10.5)
nRBC: 0 % (ref 0.0–0.2)

## 2023-02-03 LAB — URINALYSIS, ROUTINE W REFLEX MICROSCOPIC
Bilirubin Urine: NEGATIVE
Glucose, UA: NEGATIVE mg/dL
Ketones, ur: 15 mg/dL — AB
Nitrite: POSITIVE — AB
Protein, ur: 30 mg/dL — AB
Specific Gravity, Urine: 1.02 (ref 1.005–1.030)
pH: 5.5 (ref 5.0–8.0)

## 2023-02-03 LAB — I-STAT CG4 LACTIC ACID, ED: Lactic Acid, Venous: 0.8 mmol/L (ref 0.5–1.9)

## 2023-02-03 LAB — URINALYSIS, MICROSCOPIC (REFLEX): WBC, UA: >50 WBC/hpf (ref 0–5)

## 2023-02-03 MED ORDER — SODIUM CHLORIDE 0.9 % IV SOLN: INTRAVENOUS | Status: AC

## 2023-02-03 MED ORDER — SODIUM CHLORIDE 0.9 % IV SOLN
2.0000 g | Freq: Once | INTRAVENOUS | Status: AC
  Administered 2023-02-03: 2 g via INTRAVENOUS
  Filled 2023-02-03: qty 20

## 2023-02-03 MED ORDER — SODIUM CHLORIDE 0.9 % IV SOLN
2.0000 g | INTRAVENOUS | Status: AC
  Administered 2023-02-04 – 2023-02-06 (×3): 2 g via INTRAVENOUS
  Filled 2023-02-03 (×3): qty 20

## 2023-02-03 MED ORDER — ACETAMINOPHEN 325 MG PO TABS
650.0000 mg | ORAL_TABLET | Freq: Once | ORAL | Status: AC
  Administered 2023-02-03: 650 mg via ORAL
  Filled 2023-02-03: qty 2

## 2023-02-03 MED ORDER — ENOXAPARIN SODIUM 40 MG/0.4ML IJ SOSY
40.0000 mg | PREFILLED_SYRINGE | INTRAMUSCULAR | Status: DC
  Administered 2023-02-03 – 2023-02-06 (×4): 40 mg via SUBCUTANEOUS
  Filled 2023-02-03 (×4): qty 0.4

## 2023-02-03 NOTE — ED Provider Notes (Signed)
[place] EMERGENCY DEPARTMENT AT [facility] Provider Note   CSN: [id number] Arrival date & time: 02/03/23  1343     History  Chief Complaint  Patient presents with   Weakness    Dana Duke is a 52 y.o. female.   Weakness Patient with history of MS.  Has been weak for the last few days.  Febrile upon arrival.  Generally weak.  Does use a wheelchair at baseline but does not have the energy she did previously.  Cannot transfer.  Has had urinary frequency.  Last week did have diarrhea.    Past Medical History:  Diagnosis Date   Ankle fracture, right    History of hysterectomy    Knee joint dislocation    bilateral   Migraines    Multiple sclerosis (HCC)     Home Medications Prior to Admission medications   Medication Sig Start Date End Date Taking? Authorizing Provider  Ascorbic Acid (VITA-C PO) Take by mouth.    [provider]  celecoxib (CELEBREX) 200 MG capsule Take 200 mg by mouth daily. 09/24/22   [provider]  Cholecalciferol 25 MCG (1000 UT) tablet Take 1,000 Units by mouth daily.    [provider]  Fremanezumab-vfrm (AJOVY) 225 MG/1.5ML SOAJ 1.77ml into the skin once a month 05/07/22   Glean Salvo, NP  methenamine (HIPREX) 1 g tablet Take 1 g by mouth 2 (two) times daily.    [provider]  modafinil (PROVIGIL) 100 MG tablet Take 1 tablet (100 mg total) by mouth daily. 10/08/22   Levert Feinstein, MD  ocrelizumab (OCREVUS) 300 MG/10ML injection Inject into the vein.    [provider]  promethazine (PHENERGAN) 12.5 MG tablet Take 12.5 mg by mouth every 6 (six) hours as needed. 11/28/20   [provider]  Red Yeast Rice Extract (RED YEAST RICE PO) Take by mouth.    [provider]  Ubrogepant (UBRELVY) 50 MG TABS TAKE 1 TABLET BY MOUTH AT ONSET OF MIGRAINE, IF SYMPTOMS PERSIST, A SECOND DOSE MAY BE TAKEN IN 2 HOURS 01/28/23   Glean Salvo, NP  venlafaxine XR (EFFEXOR XR) 37.5 MG 24 hr  capsule Take 1 capsule (37.5 mg total) by mouth daily with breakfast. 05/07/22   Glean Salvo, NP      Allergies    Sulpiride, Sulfa antibiotics, and Sulfacetamide sodium    Review of Systems   Review of Systems  Neurological:  Positive for weakness.    Physical Exam Updated Vital Signs BP 128/87 (BP Location: Right Arm)   Pulse (!) 101   Temp (!) 100.8 F (38.2 C) (Oral)   Resp 18   Ht 5\' 6"  (1.676 m)   Wt 89.8 kg   SpO2 100%   BMI 31.96 kg/m  Physical Exam Vitals and nursing note reviewed.  Eyes:     Pupils: Pupils are equal, round, and reactive to light.  Cardiovascular:     Rate and Rhythm: Tachycardia present.  Pulmonary:     Breath sounds: No wheezing.  Abdominal:     Tenderness: There is no abdominal tenderness.  Neurological:     Mental Status: She is alert.     Comments: Awake and appropriate.  Generally weak.     ED Results / Procedures / Treatments   Labs (all labs ordered are listed, but only abnormal results are displayed) Labs Reviewed  CBC WITH DIFFERENTIAL/PLATELET - Abnormal; Notable for the following components:      Result  Value   Neutro Abs 8.0 (*)    All other components within normal limits  COMPREHENSIVE METABOLIC PANEL - Abnormal; Notable for the following components:   Glucose, Bld 192 (*)    Calcium 8.4 (*)    All other components within normal limits  URINALYSIS, ROUTINE W REFLEX MICROSCOPIC - Abnormal; Notable for the following components:   APPearance CLOUDY (*)    Hgb urine dipstick MODERATE (*)    Ketones, ur 15 (*)    Protein, ur 30 (*)    Nitrite POSITIVE (*)    Leukocytes,Ua LARGE (*)    All other components within normal limits  URINALYSIS, MICROSCOPIC (REFLEX) - Abnormal; Notable for the following components:   Bacteria, UA MANY (*)    All other components within normal limits  URINE CULTURE  CULTURE, BLOOD (ROUTINE X 2)  CULTURE, BLOOD (ROUTINE X 2)  I-STAT CG4 LACTIC ACID, ED  I-STAT CG4 LACTIC ACID, ED     EKG None  Radiology No results found.  Procedures Procedures    Medications Ordered in ED Medications  acetaminophen (TYLENOL) tablet 650 mg (has no administration in time range)  cefTRIAXone (ROCEPHIN) 2 g in sodium chloride 0.9 % 100 mL IVPB (has no administration in time range)    ED Course/ Medical Decision Making/ A&P                                 Medical Decision Making Amount and/or Complexity of Data Reviewed Labs: ordered.  Risk OTC drugs.   Patient with generalized weakness.  Fever.  Urine shows UTI. I think the urine is likely the source of her fever.  With the generalized weakness and I feel she benefit from mission to the hospital.  Will add blood cultures and does not appear overtly septic at this time.    MS exacerbation felt less likely and I think the infection is more likely the cause.  However discussed curbside with Dr. Amada Jupiter from neurology  .  Could either treat the infection and see how she does and if the this improves.  If does not improve could get MRI with and without contrast of the head spine and thoracic spine.  Alternately could get MRI done now.    Will discuss with unassigned medicine for admission.        Final Clinical Impression(s) / ED Diagnoses Final diagnoses:  Urinary tract infection without hematuria, site unspecified  MS (multiple sclerosis) (HCC)    Rx / DC Orders ED Discharge Orders     None         Benjiman Core, MD 02/03/23 1835

## 2023-02-03 NOTE — H&P (Incomplete)
History and Physical  Dana Duke QIO:[id number] DOB: [birth year] DOA: 02/03/2023  Referring physician: Dr. Rubin Payor, EDP  PCP: Harvie Heck, MD  Outpatient Specialists: Neurology. Patient coming from: Home.  Chief Complaint: Generalized weakness.  HPI: Dana Duke is a 52 y.o. female with medical history significant for multiple sclerosis, wheelchair-bound, chronic anxiety/depression, migraines, recurrent UTIs, who presents from home with complaints of generalized weakness and subjective fevers.  Associated with dysuria.  In the ED febrile with Tmax 101, tachycardic with pulse of 111.  UA positive for pyuria.  Peripheral blood cultures x 2 and urine culture obtained.  The patient was started on empiric IV antibiotics Rocephin.  EDP discussed the case with neurology who recommended treatment for infection for now and monitor for improvement.  If no improvement consider MRI imaging to rule out MS exacerbation.  Admitted by [facility], hospitalist service.  ED Course: Tmax 101.  BP 114/78, pulse 111, respiratory 16, O2 saturation 98% on room air.  Lab studies remarkable for neutrophil count 8.0.  WBC 9.8, hemoglobin 12.0, platelet count 278.  Serum glucose 192.  Lactic acid 0.8.  Review of Systems: Review of systems as noted in the HPI. All other systems reviewed and are negative.   Past Medical History:  Diagnosis Date   Ankle fracture, right    History of hysterectomy    Knee joint dislocation    bilateral   Migraines    Multiple sclerosis (HCC)    Past Surgical History:  Procedure Laterality Date   ABDOMINAL HYSTERECTOMY     CESAREAN SECTION     MYOMECTOMY      Social History:  reports that she has never smoked. She has never used smokeless tobacco. She reports that she does not drink alcohol and does not use drugs.   Allergies  Allergen Reactions   Sulpiride Nausea Only    Other Reaction(s): vomiting   Sulfa Antibiotics Nausea And Vomiting   Sulfacetamide Sodium      Other Reaction(s): GI Intolerance    Family History  Problem Relation Age of Onset   Diabetes Mother    Osteoarthritis Other    Heart disease Other    Rheumatic fever Other    Hypertension Other       Prior to Admission medications   Medication Sig Start Date End Date Taking? Authorizing Provider  Ascorbic Acid (VITA-C PO) Take by mouth.    [provider]  celecoxib (CELEBREX) 200 MG capsule Take 200 mg by mouth daily. 09/24/22   [provider]  Cholecalciferol 25 MCG (1000 UT) tablet Take 1,000 Units by mouth daily.    [provider]  Fremanezumab-vfrm (AJOVY) 225 MG/1.5ML SOAJ 1.16ml into the skin once a month 05/07/22   Glean Salvo, NP  methenamine (HIPREX) 1 g tablet Take 1 g by mouth 2 (two) times daily.    [provider]  modafinil (PROVIGIL) 100 MG tablet Take 1 tablet (100 mg total) by mouth daily. 10/08/22   Levert Feinstein, MD  ocrelizumab (OCREVUS) 300 MG/10ML injection Inject into the vein.    [provider]  promethazine (PHENERGAN) 12.5 MG tablet Take 12.5 mg by mouth every 6 (six) hours as needed. 11/28/20   [provider]  Red Yeast Rice Extract (RED YEAST RICE PO) Take by mouth.    [provider]  Ubrogepant (UBRELVY) 50 MG TABS TAKE 1 TABLET BY MOUTH AT ONSET OF MIGRAINE, IF SYMPTOMS PERSIST, A SECOND DOSE MAY BE TAKEN IN 2 HOURS 01/28/23  Glean Salvo, NP  venlafaxine XR (EFFEXOR XR) 37.5 MG 24 hr capsule Take 1 capsule (37.5 mg total) by mouth daily with breakfast. 05/07/22   Glean Salvo, NP    Physical Exam: BP 128/87 (BP Location: Right Arm)   Pulse (!) 101   Temp (!) 100.8 F (38.2 C) (Oral)   Resp 18   Ht 5\' 6"  (1.676 m)   Wt 89.8 kg   SpO2 100%   BMI 31.96 kg/m   General: 52 y.o. year-old female well developed well nourished in no acute distress.  Alert and oriented x3. Cardiovascular: Regular rate and rhythm with no rubs or gallops.  No thyromegaly or JVD noted.  No lower  extremity edema. 2/4 pulses in all 4 extremities. Respiratory: Clear to auscultation with no wheezes or rales. Good inspiratory effort. Abdomen: Soft nontender nondistended with normal bowel sounds x4 quadrants. Muskuloskeletal: No cyanosis, clubbing or edema noted bilaterally Neuro: CN II-XII intact, strength, sensation, reflexes Skin: No ulcerative lesions noted or rashes Psychiatry: Judgement and insight appear normal. Mood is appropriate for condition and setting          Labs on Admission:  Basic Metabolic Panel: Recent Labs  Lab 02/03/23 1412  NA 139  K 3.9  CL 106  CO2 24  GLUCOSE 192*  BUN 10  CREATININE 0.93  CALCIUM 8.4*   Liver Function Tests: Recent Labs  Lab 02/03/23 1412  AST 27  ALT 33  ALKPHOS 57  BILITOT 0.8  PROT 7.0  ALBUMIN 3.5   No results for input(s): "LIPASE", "AMYLASE" in the last 168 hours. No results for input(s): "AMMONIA" in the last 168 hours. CBC: Recent Labs  Lab 02/03/23 1412  WBC 9.8  NEUTROABS 8.0*  HGB 12.4  HCT 38.7  MCV 83.4  PLT 278   Cardiac Enzymes: No results for input(s): "CKTOTAL", "CKMB", "CKMBINDEX", "TROPONINI" in the last 168 hours.  BNP (last 3 results) No results for input(s): "BNP" in the last 8760 hours.  ProBNP (last 3 results) No results for input(s): "PROBNP" in the last 8760 hours.  CBG: No results for input(s): "GLUCAP" in the last 168 hours.  Radiological Exams on Admission: No results found.  EKG: I independently viewed the EKG done and my findings are as followed: None available at the time of this visit.  Assessment/Plan Present on Admission: **None**  Principal Problem:   Generalized weakness  Generalized weakness, suspect multifactorial The patient is wheelchair-bound, at baseline is able to transfer In the setting of UTI and dehydration Treat underlying conditions Started on Rocephin empirically for UTI, continue Gentle IV fluid hydration NS at 50 cc/h x 1 day  UTI,  POA Follow urine culture and peripheral blood cultures x 2 for ID and sensitivities. Continue Rocephin empirically Narrow down antibiotics as able. Monitor fever curve and WBCs.  Ambulatory dysfunction in the setting of MS Wheelchair-bound PT OT to evaluate Fall precautions  Hyperglycemia Serum glucose 192 on presentation. No reported history of diabetes Obtain hemoglobin A1c Monitor for now If hemoglobin A1c returns elevated and blood sugar remains persistently elevated consider treating with subcu insulin CBG before meals  Multiple sclerosis Follows with neurology Consider neurology consultation if no improvement after treatment of UTI.   Time: 75 minutes.   DVT prophylaxis: Subcu Lovenox daily  Code Status: Full code  Family Communication: None at bedside.  Disposition Plan: Admitted to telemetry medical unit.  Consults called: None.  Admission status: Inpatient status.   Status is: Inpatient The patient  requires at least 2 midnights for further evaluation and treatment of present condition.   Darlin Drop MD [facility] Pager [phone]  If 7PM-7AM, please contact night-coverage [email or web address] Password [email or web address]  02/03/2023, 7:31 PM

## 2023-02-03 NOTE — ED Triage Notes (Signed)
Pt to ED c/o generalized weakness x  3 days. Reports diarrhea last week. Febrile in triage. History of MS.

## 2023-02-03 NOTE — Telephone Encounter (Signed)
Called and spoke to patient who reports, she is feeling very weak and fell down on Saturday evening, no noted injuries, but she had to be carried inside because she was too weak to stand and also having eye pain, Sunday morning she woke up and was able to walk but very wobbly and still weaker than normal, but reports she did have diarrhea last week and is not sure if it is related or not, and got her flu vaccine two weeks ago. I advised ER if needed, but also stated I would forward to provider. Please also see mychart message

## 2023-02-03 NOTE — Telephone Encounter (Signed)
Pt reports she is having an  Exacerbation since yesterday, she has not been to ED , she is asking for a call from RN to discuss before anything else.  Pt informed the message would be sent as high priority, but that she should not hesitate to go to ED if need be.

## 2023-02-03 NOTE — ED Provider Triage Note (Signed)
Emergency Medicine Provider Triage Evaluation Note  Dana Duke , a 52 y.o. female  was evaluated in triage.  Pt complains of Patient here c/o generalized weakness and fever today. Diarrheal illness last week.   Review of Systems  Positive: Gen weakness, fever Negative: Vomiting, pain  Physical Exam  BP 114/78 (BP Location: Left Arm)   Pulse (!) 111   Temp (!) 101 F (38.3 C) (Oral)   Resp 16   Ht 5\' 6"  (1.676 m)   Wt 89.8 kg   SpO2 98%   BMI 31.96 kg/m  Gen:   Awake, no distress   Resp:  Normal effort  MSK:   Moves extremities without difficulty  Other:    Medical Decision Making  Medically screening exam initiated at 2:07 PM.  Appropriate orders placed.  Toshi A Scardino was informed that the remainder of the evaluation will be completed by another provider, this initial triage assessment does not replace that evaluation, and the importance of remaining in the ED until their evaluation is complete.  Patient with history of MS here with fever 101 and generalized weakness. No sick contacts.    Elpidio Anis, PA-C 02/03/23 1409

## 2023-02-04 ENCOUNTER — Telehealth: Payer: BC Managed Care – PPO | Admitting: Neurology

## 2023-02-04 ENCOUNTER — Other Ambulatory Visit (HOSPITAL_COMMUNITY): Payer: Self-pay

## 2023-02-04 DIAGNOSIS — R531 Weakness: Secondary | ICD-10-CM | POA: Diagnosis not present

## 2023-02-04 LAB — BLOOD CULTURE ID PANEL (REFLEXED) - BCID2

## 2023-02-04 LAB — HIV ANTIBODY (ROUTINE TESTING W REFLEX): HIV Screen 4th Generation wRfx: NONREACTIVE

## 2023-02-04 LAB — HEMOGLOBIN A1C
Hgb A1c MFr Bld: 6.5 % — ABNORMAL HIGH (ref 4.8–5.6)
Mean Plasma Glucose: 139.85 mg/dL

## 2023-02-04 LAB — BASIC METABOLIC PANEL
Anion gap: 15 (ref 5–15)
BUN: 10 mg/dL (ref 6–20)
CO2: 22 mmol/L (ref 22–32)
Calcium: 8.7 mg/dL — ABNORMAL LOW (ref 8.9–10.3)
Chloride: 102 mmol/L (ref 98–111)
Creatinine, Ser: 0.84 mg/dL (ref 0.44–1.00)
GFR, Estimated: >60 mL/min (ref >60)
Glucose, Bld: 138 mg/dL — ABNORMAL HIGH (ref 70–99)
Potassium: 3.8 mmol/L (ref 3.5–5.1)
Sodium: 139 mmol/L (ref 135–145)

## 2023-02-04 LAB — GLUCOSE, CAPILLARY
Glucose-Capillary: 118 mg/dL — ABNORMAL HIGH (ref 70–99)
Glucose-Capillary: 118 mg/dL — ABNORMAL HIGH (ref 70–99)
Glucose-Capillary: 95 mg/dL (ref 70–99)
Glucose-Capillary: 197 mg/dL — ABNORMAL HIGH (ref 70–99)

## 2023-02-04 LAB — CBC
HCT: 40.5 % (ref 36.0–46.0)
Hemoglobin: 12.7 g/dL (ref 12.0–15.0)
MCH: 27.2 pg (ref 26.0–34.0)
MCHC: 31.4 g/dL (ref 30.0–36.0)
MCV: 86.7 fL (ref 80.0–100.0)
Platelets: 265 10*3/uL (ref 150–400)
RBC: 4.67 MIL/uL (ref 3.87–5.11)
RDW: 14.9 % (ref 11.5–15.5)
WBC: 9.7 10*3/uL (ref 4.0–10.5)
nRBC: 0 % (ref 0.0–0.2)

## 2023-02-04 LAB — PHOSPHORUS: Phosphorus: 3.4 mg/dL (ref 2.5–4.6)

## 2023-02-04 LAB — MAGNESIUM: Magnesium: 1.9 mg/dL (ref 1.7–2.4)

## 2023-02-04 LAB — SARS CORONAVIRUS 2 BY RT PCR: SARS Coronavirus 2 by RT PCR: NEGATIVE

## 2023-02-04 MED ORDER — PROCHLORPERAZINE EDISYLATE 10 MG/2ML IJ SOLN: 5.0000 mg | Freq: Four times a day (QID) | INTRAMUSCULAR | Status: DC | PRN

## 2023-02-04 MED ORDER — ORAL CARE MOUTH RINSE: 15.0000 mL | OROMUCOSAL | Status: DC | PRN

## 2023-02-04 MED ORDER — ACETAMINOPHEN 325 MG PO TABS
650.0000 mg | ORAL_TABLET | Freq: Four times a day (QID) | ORAL | Status: DC | PRN
  Administered 2023-02-04 – 2023-02-05 (×3): 650 mg via ORAL
  Filled 2023-02-04 (×3): qty 2

## 2023-02-04 MED ORDER — VITAMIN D 25 MCG (1000 UNIT) PO TABS
1000.0000 [IU] | ORAL_TABLET | Freq: Every day | ORAL | Status: DC
  Administered 2023-02-04 – 2023-02-07 (×4): 1000 [IU] via ORAL
  Filled 2023-02-04 (×4): qty 1

## 2023-02-04 MED ORDER — LIVING WELL WITH DIABETES BOOK
Freq: Once | Status: AC
  Filled 2023-02-04: qty 1

## 2023-02-04 MED ORDER — POLYETHYLENE GLYCOL 3350 17 G PO PACK: 17.0000 g | PACK | Freq: Every day | ORAL | Status: DC | PRN

## 2023-02-04 MED ORDER — MELATONIN 5 MG PO TABS
5.0000 mg | ORAL_TABLET | Freq: Every evening | ORAL | Status: DC | PRN
  Administered 2023-02-04: 5 mg via ORAL
  Filled 2023-02-04: qty 1

## 2023-02-04 MED ORDER — INSULIN ASPART 100 UNIT/ML IJ SOLN
0.0000 [IU] | Freq: Three times a day (TID) | INTRAMUSCULAR | Status: DC
  Administered 2023-02-05 (×2): 1 [IU] via SUBCUTANEOUS

## 2023-02-04 NOTE — Progress Notes (Signed)
Admitted with generalized wekaness and dehydration suspected 2/2 to UTI, hx of MS, multi falls.   02/04/23 1516  TOC Brief Assessment  Insurance and Status Reviewed  Patient has primary care physician Yes  Home environment has been reviewed From home with spouse. Spouse works during the day, 8am-7pm.  Prior level of function: W/C bound. DME: W/C, RW, BSC  Prior/Current Home Services No current home services  Social Determinants of Health Reivew SDOH reviewed no interventions necessary  Readmission risk has been reviewed No  Transition of care needs transition of care needs identified, TOC will continue to follow   PT/OT evaluations pending . Pt states open to SNF  rehab if needed. TOC team following and will assist with needs... Gae Gallop RN,BSN,CM

## 2023-02-04 NOTE — Progress Notes (Addendum)
PROGRESS NOTE    Dana Duke  XBJ:[id number] DOB: Jun 15, 1970 DOA: 02/03/2023 PCP: Harvie Heck, MD    Brief Narrative:  Dana Duke is a 52 y.o. female with medical history significant for multiple sclerosis, not entirely wheelchair-bound, chronic anxiety/depression, migraines, recurrent UTIs, who presents from home with complaints of generalized weakness and subjective fevers.     Assessment and Plan: Generalized weakness, suspect multifactorial Generalized weakness in the setting of UTI and dehydration Started on Rocephin empirically for UTI, continue until culture back Gentle IV fluid hydration NS at 50 cc/h x 1 day   UTI, POA now with positive blood cultures Follow urine culture and peripheral blood cultures x 2 for ID and sensitivities. Continue Rocephin empirically Narrow down antibiotics as able. Monitor fever curve and WBCs. BCID positive for E. Coli in 1/4, no resistance identified    Recurrent fevers with Tmax 102 Continue to treat UTI COVID-19 screening test is negative Continue to monitor fever curve and WBCs.   Ambulatory dysfunction in the setting of MS -PT/OT Fall precautions   Hyperglycemia/new DM Serum glucose 192 on presentation. No reported history of diabetes - hemoglobin A1c: 6.5-- will have DM coordinator see and educate-- do not suspect she will need medications but more so diet control and treatment of infection -SSI   Multiple sclerosis Follows with neurology outpatient Consider neurology consultation if no improvement of her symptomatology after treatment of UTI-- may need MRI   Obesity Estimated body mass index is 31.96 kg/m as calculated from the following:   Height as of this encounter: 5\' 6"  (1.676 m).   Weight as of this encounter: 89.8 kg.    DVT prophylaxis: enoxaparin (LOVENOX) injection 40 mg Start: 02/03/23 1930    Code Status: Full Code   Disposition Plan:  Level of care: Telemetry Medical Status is:  Inpatient     Consultants:  Neuro (phone in ER)   Subjective: Not yet  feeling better  Objective: Vitals:   02/04/23 0345 02/04/23 0348 02/04/23 0400 02/04/23 0445  BP: 123/75  119/71 119/75  Pulse: (!) 106  (!) 102 99  Resp:   20   Temp:  99.7 F (37.6 C)    TempSrc:  Oral    SpO2: 96%  96% 99%  Weight:      Height:        Intake/Output Summary (Last 24 hours) at 02/04/2023 0734 Last data filed at 02/04/2023 0700 Gross per 24 hour  Intake 512.03 ml  Output --  Net 512.03 ml   Filed Weights   02/03/23 1407  Weight: 89.8 kg    Examination:   General: Appearance:    Obese female in no acute distress     Lungs:     Clear to auscultation bilaterally, respirations unlabored  Heart:    Normal heart rate. Normal rhythm. No murmurs, rubs, or gallops.    MS:   All extremities are intact.    Neurologic:   Awake, alert       Data Reviewed: I have personally reviewed following labs and imaging studies  CBC: Recent Labs  Lab 02/03/23 1412 02/04/23 0210  WBC 9.8 9.7  NEUTROABS 8.0*  --   HGB 12.4 12.7  HCT 38.7 40.5  MCV 83.4 86.7  PLT 278 265   Basic Metabolic Panel: Recent Labs  Lab 02/03/23 1412 02/04/23 0210  NA 139 139  K 3.9 3.8  CL 106 102  CO2 24 22  GLUCOSE 192* 138*  BUN 10 10  CREATININE 0.93 0.84  CALCIUM 8.4* 8.7*  MG  --  1.9  PHOS  --  3.4   GFR: Estimated Creatinine Clearance: 86.4 mL/min (by C-G formula based on SCr of 0.84 mg/dL). Liver Function Tests: Recent Labs  Lab 02/03/23 1412  AST 27  ALT 33  ALKPHOS 57  BILITOT 0.8  PROT 7.0  ALBUMIN 3.5   No results for input(s): "LIPASE", "AMYLASE" in the last 168 hours. No results for input(s): "AMMONIA" in the last 168 hours. Coagulation Profile: No results for input(s): "INR", "PROTIME" in the last 168 hours. Cardiac Enzymes: No results for input(s): "CKTOTAL", "CKMB", "CKMBINDEX", "TROPONINI" in the last 168 hours. BNP (last 3 results) No results for input(s):  "PROBNP" in the last 8760 hours. HbA1C: Recent Labs    02/04/23 0210  HGBA1C 6.5*   CBG: No results for input(s): "GLUCAP" in the last 168 hours. Lipid Profile: No results for input(s): "CHOL", "HDL", "LDLCALC", "TRIG", "CHOLHDL", "LDLDIRECT" in the last 72 hours. Thyroid Function Tests: No results for input(s): "TSH", "T4TOTAL", "FREET4", "T3FREE", "THYROIDAB" in the last 72 hours. Anemia Panel: No results for input(s): "VITAMINB12", "FOLATE", "FERRITIN", "TIBC", "IRON", "RETICCTPCT" in the last 72 hours. Sepsis Labs: Recent Labs  Lab 02/03/23 1423  LATICACIDVEN 0.8    Recent Results (from the past 240 hour(s))  Culture, blood (routine x 2)     Status: None (Preliminary result)   Collection Time: 02/03/23  6:44 PM   Specimen: BLOOD  Result Value Ref Range Status   Specimen Description BLOOD RIGHT ANTECUBITAL  Final   Special Requests   Final    BOTTLES DRAWN AEROBIC AND ANAEROBIC Blood Culture adequate volume   Culture   Final    NO GROWTH < 12 HOURS Performed at [facility], [address], [place], [place] [address]    Report Status PENDING  Incomplete  Culture, blood (routine x 2)     Status: None (Preliminary result)   Collection Time: 02/03/23  7:00 PM   Specimen: BLOOD  Result Value Ref Range Status   Specimen Description BLOOD LEFT ANTECUBITAL  Final   Special Requests   Final    BOTTLES DRAWN AEROBIC AND ANAEROBIC Blood Culture results may not be optimal due to an excessive volume of blood received in culture bottles   Culture   Final    NO GROWTH < 12 HOURS Performed at [facility], [address] [place], [place] [address]    Report Status PENDING  Incomplete  SARS Coronavirus 2 by RT PCR (hospital order, performed in [facility] hospital lab) *cepheid single result test* Anterior Nasal Swab     Status: None   Collection Time: 02/04/23  2:10 AM   Specimen: Anterior Nasal Swab  Result Value Ref Range Status   SARS Coronavirus 2 by RT PCR  NEGATIVE NEGATIVE Final    Comment: Performed at [facility], [address], [place], [place] [address]         Radiology Studies: No results found.      Scheduled Meds:  cholecalciferol  1,000 Units Oral Daily   enoxaparin (LOVENOX) injection  40 mg Subcutaneous Q24H   Continuous Infusions:  sodium chloride 50 mL/hr at 02/04/23 0700   cefTRIAXone (ROCEPHIN)  IV       LOS: 1 day    Time spent: 45 minutes spent on chart review, discussion with nursing staff, consultants, updating family and interview/physical exam; more than 50% of that time was spent in  counseling and/or coordination of care.    Joseph Art, DO [facility] Available via Epic secure chat 7am-7pm After these hours, please refer to coverage provider listed on [email or web address] 02/04/2023, 7:34 AM

## 2023-02-04 NOTE — ED Notes (Signed)
ED TO INPATIENT HANDOFF REPORT  ED Nurse Name and Phone #: Jeanice Lim [phone]   S Name/Age/Gender Dana Duke 52 y.o. female Room/Bed: 040C/040C  Code Status   Code Status: Full Code  Home/SNF/Other Home Patient oriented to: self, place, time, and situation Is this baseline? Yes   Triage Complete: Triage complete  Chief Complaint Generalized weakness [R53.1]  Triage Note Pt to ED c/o generalized weakness x  3 days. Reports diarrhea last week. Febrile in triage. History of MS.    Allergies Allergies  Allergen Reactions   Sulpiride Nausea Only    Other Reaction(s): vomiting   Sulfa Antibiotics Nausea And Vomiting   Sulfacetamide Sodium     Other Reaction(s): GI Intolerance    Level of Care/Admitting Diagnosis ED Disposition     ED Disposition  Admit   Condition  --   Comment  Hospital Area: [facility] [[id number]]  Level of Care: Telemetry Medical [104]  May admit patient to [facility] or [facility] if equivalent level of care is available:: Yes  Covid Evaluation: Asymptomatic - no recent exposure (last 10 days) testing not required  Diagnosis: Generalized weakness [956213]  Admitting Physician: Darlin Drop [[id number]]  Attending Physician: Darlin Drop [[id number]]  Certification:: I certify this patient will need inpatient services for at least 2 midnights  Expected Medical Readiness: 02/05/2023          B Medical/Surgery History Past Medical History:  Diagnosis Date   Ankle fracture, right    History of hysterectomy    Knee joint dislocation    bilateral   Migraines    Multiple sclerosis (HCC)    Past Surgical History:  Procedure Laterality Date   ABDOMINAL HYSTERECTOMY     CESAREAN SECTION     MYOMECTOMY       A IV Location/Drains/Wounds Patient Lines/Drains/Airways Status     Active Line/Drains/Airways     Name Placement date Placement time Site Days   Peripheral IV 02/03/23 20 G Right Antecubital 02/03/23  1844   Antecubital  1            Intake/Output Last 24 hours  Intake/Output Summary (Last 24 hours) at 02/04/2023 0612 Last data filed at 02/03/2023 1934 Gross per 24 hour  Intake 100 ml  Output --  Net 100 ml    Labs/Imaging Results for orders placed or performed during the hospital encounter of 02/03/23 (from the past 48 hour(s))  CBC with Differential     Status: Abnormal   Collection Time: 02/03/23  2:12 PM  Result Value Ref Range   WBC 9.8 4.0 - 10.5 K/uL   RBC 4.64 3.87 - 5.11 MIL/uL   Hemoglobin 12.4 12.0 - 15.0 g/dL   HCT 84.1 32.4 - 40.1 %   MCV 83.4 80.0 - 100.0 fL   MCH 26.7 26.0 - 34.0 pg   MCHC 32.0 30.0 - 36.0 g/dL   RDW 02.7 25.3 - 66.4 %   Platelets 278 150 - 400 K/uL   nRBC 0.0 0.0 - 0.2 %   Neutrophils Relative % 83 %   Neutro Abs 8.0 (H) 1.7 - 7.7 K/uL   Lymphocytes Relative 7 %   Lymphs Abs 0.7 0.7 - 4.0 K/uL   Monocytes Relative 10 %   Monocytes Absolute 1.0 0.1 - 1.0 K/uL   Eosinophils Relative 0 %   Eosinophils Absolute 0.0 0.0 - 0.5 K/uL   Basophils Relative 0 %   Basophils Absolute 0.0 0.0 - 0.1 K/uL  Immature Granulocytes 0 %   Abs Immature Granulocytes 0.04 0.00 - 0.07 K/uL    Comment: Performed at [facility], [address], [place], [place] [address]  Comprehensive metabolic panel     Status: Abnormal   Collection Time: 02/03/23  2:12 PM  Result Value Ref Range   Sodium 139 135 - 145 mmol/L   Potassium 3.9 3.5 - 5.1 mmol/L   Chloride 106 98 - 111 mmol/L   CO2 24 22 - 32 mmol/L   Glucose, Bld 192 (H) 70 - 99 mg/dL    Comment: Glucose reference range applies only to samples taken after fasting for at least 8 hours.   BUN 10 6 - 20 mg/dL   Creatinine, Ser 6.57 0.44 - 1.00 mg/dL   Calcium 8.4 (L) 8.9 - 10.3 mg/dL   Total Protein 7.0 6.5 - 8.1 g/dL   Albumin 3.5 3.5 - 5.0 g/dL   AST 27 15 - 41 U/L   ALT 33 0 - 44 U/L   Alkaline Phosphatase 57 38 - 126 U/L   Total Bilirubin 0.8 0.3 - 1.2 mg/dL   GFR, Estimated >84 >69 mL/min     Comment: (NOTE) Calculated using the CKD-EPI Creatinine Equation (2021)    Anion gap 9 5 - 15    Comment: Performed at [facility], [address] [place], [place] [address]  Urinalysis, Routine w reflex microscopic -Urine, Clean Catch     Status: Abnormal   Collection Time: 02/03/23  2:12 PM  Result Value Ref Range   Color, Urine YELLOW YELLOW    Comment: LESS THAN 10 mL OF URINE SUBMITTED MICROSCOPIC EXAM PERFORMED ON UNCONCENTRATED URINE    APPearance CLOUDY (A) CLEAR   Specific Gravity, Urine 1.020 1.005 - 1.030   pH 5.5 5.0 - 8.0   Glucose, UA NEGATIVE NEGATIVE mg/dL   Hgb urine dipstick MODERATE (A) NEGATIVE   Bilirubin Urine NEGATIVE NEGATIVE   Ketones, ur 15 (A) NEGATIVE mg/dL   Protein, ur 30 (A) NEGATIVE mg/dL   Nitrite POSITIVE (A) NEGATIVE   Leukocytes,Ua LARGE (A) NEGATIVE    Comment: Performed at [facility], [address], [place], [place] [address]  Urinalysis, Microscopic (reflex)     Status: Abnormal   Collection Time: 02/03/23  2:12 PM  Result Value Ref Range   RBC / HPF 6-10 0 - 5 RBC/hpf   WBC, UA >50 0 - 5 WBC/hpf   Bacteria, UA MANY (A) NONE SEEN   Squamous Epithelial / HPF 6-10 0 - 5 /HPF   WBC Casts, UA PRESENT     Comment: Performed at [facility], [address], [place], [place] [address]  I-Stat Lactic Acid     Status: None   Collection Time: 02/03/23  2:23 PM  Result Value Ref Range   Lactic Acid, Venous 0.8 0.5 - 1.9 mmol/L  CBC     Status: None   Collection Time: 02/04/23  2:10 AM  Result Value Ref Range   WBC 9.7 4.0 - 10.5 K/uL   RBC 4.67 3.87 - 5.11 MIL/uL   Hemoglobin 12.7 12.0 - 15.0 g/dL   HCT 27.2 53.6 - 64.4 %   MCV 86.7 80.0 - 100.0 fL   MCH 27.2 26.0 - 34.0 pg   MCHC 31.4 30.0 - 36.0 g/dL   RDW 03.4 74.2 - 59.5 %   Platelets 265 150 - 400 K/uL   nRBC 0.0 0.0 - 0.2 %  Comment: Performed at [facility], [address], [place], [place] [address]  Basic metabolic panel     Status: Abnormal    Collection Time: 02/04/23  2:10 AM  Result Value Ref Range   Sodium 139 135 - 145 mmol/L   Potassium 3.8 3.5 - 5.1 mmol/L   Chloride 102 98 - 111 mmol/L   CO2 22 22 - 32 mmol/L   Glucose, Bld 138 (H) 70 - 99 mg/dL    Comment: Glucose reference range applies only to samples taken after fasting for at least 8 hours.   BUN 10 6 - 20 mg/dL   Creatinine, Ser 4.33 0.44 - 1.00 mg/dL   Calcium 8.7 (L) 8.9 - 10.3 mg/dL   GFR, Estimated >29 >51 mL/min    Comment: (NOTE) Calculated using the CKD-EPI Creatinine Equation (2021)    Anion gap 15 5 - 15    Comment: Performed at [facility], [address], [place], [place] [address]  Magnesium     Status: None   Collection Time: 02/04/23  2:10 AM  Result Value Ref Range   Magnesium 1.9 1.7 - 2.4 mg/dL    Comment: Performed at [facility], [address], [place], [place] [address]  Phosphorus     Status: None   Collection Time: 02/04/23  2:10 AM  Result Value Ref Range   Phosphorus 3.4 2.5 - 4.6 mg/dL    Comment: Performed at [facility], [address] [place], [place] [address]  Hemoglobin A1c     Status: Abnormal   Collection Time: 02/04/23  2:10 AM  Result Value Ref Range   Hgb A1c MFr Bld 6.5 (H) 4.8 - 5.6 %    Comment: (NOTE) Pre diabetes:          5.7%-6.4%  Diabetes:              >6.4%  Glycemic control for   <7.0% adults with diabetes    Mean Plasma Glucose 139.85 mg/dL    Comment: Performed at [facility], [address], [place], [place] [address]  HIV Antibody (routine testing w rflx)     Status: None   Collection Time: 02/04/23  2:10 AM  Result Value Ref Range   HIV Screen 4th Generation wRfx Non Reactive Non Reactive    Comment: Performed at [facility], [address] [place], [place] [address]  SARS Coronavirus 2 by RT PCR (hospital order, performed in [facility] hospital lab) *cepheid single result test* Anterior Nasal Swab     Status: None   Collection Time: 02/04/23  2:10 AM    Specimen: Anterior Nasal Swab  Result Value Ref Range   SARS Coronavirus 2 by RT PCR NEGATIVE NEGATIVE    Comment: Performed at [facility], [address], [place], [place] [address]   No results found.  Pending Labs Unresulted Labs (From admission, onward)     Start     Ordered   02/10/23 0500  Creatinine, serum  (enoxaparin (LOVENOX)    CrCl >/= 30 ml/min)  Weekly,   R     Comments: while on enoxaparin therapy    02/03/23 1923   02/03/23 1828  Culture, blood (routine x 2)  BLOOD CULTURE X 2,   R (with STAT occurrences)      02/03/23 1827   02/03/23 1647  Urine Culture  Once,   URGENT       Question:  Indication  Answer:  Dysuria   02/03/23 1647            Vitals/Pain Today's Vitals   02/04/23 0348 02/04/23 0400 02/04/23 0443 02/04/23 0445  BP:  119/71  119/75  Pulse:  (!) 102  99  Resp:  20    Temp: 99.7 F (37.6 C)     TempSrc: Oral     SpO2:  96%  99%  Weight:      Height:      PainSc:   0-No pain     Isolation Precautions Airborne and Contact precautions  Medications Medications  enoxaparin (LOVENOX) injection 40 mg (40 mg Subcutaneous Given 02/03/23 2243)  0.9 %  sodium chloride infusion ( Intravenous New Bag/Given 02/03/23 2243)  cefTRIAXone (ROCEPHIN) 2 g in sodium chloride 0.9 % 100 mL IVPB (has no administration in time range)  acetaminophen (TYLENOL) tablet 650 mg (650 mg Oral Given 02/04/23 0201)  prochlorperazine (COMPAZINE) injection 5 mg (has no administration in time range)  polyethylene glycol (MIRALAX / GLYCOLAX) packet 17 g (has no administration in time range)  melatonin tablet 5 mg (has no administration in time range)  cholecalciferol (VITAMIN D3) 25 MCG (1000 UNIT) tablet 1,000 Units (has no administration in time range)  acetaminophen (TYLENOL) tablet 650 mg (650 mg Oral Given 02/03/23 1850)  cefTRIAXone (ROCEPHIN) 2 g in sodium chloride 0.9 % 100 mL IVPB (0 g Intravenous Stopped 02/03/23 1934)    Mobility walks with person  assist     Focused Assessments Cardiac Assessment Handoff:    Lab Results  Component Value Date   CKTOTAL 259 (H) 08/23/2013   No results found for: "DDIMER" Does the Patient currently have chest pain? No   , Pulmonary Assessment Handoff:  Lung sounds:  clear O2 Device: Room Air      R Recommendations: See Admitting Provider Note  Report given to:   Additional Notes:

## 2023-02-04 NOTE — Inpatient Diabetes Management (Signed)
Inpatient Diabetes Program Recommendations  AACE/ADA: New Consensus Statement on Inpatient Glycemic Control (2015)  Target Ranges:  Prepandial:   less than 140 mg/dL      Peak postprandial:   less than 180 mg/dL (1-2 hours)      Critically ill patients:  140 - 180 mg/dL   Lab Results  Component Value Date   GLUCAP 95 02/04/2023   HGBA1C 6.5 (H) 02/04/2023    Review of Glycemic Control  Diabetes history: Prediabetes Outpatient Diabetes medications: None Current orders for Inpatient glycemic control: Novolog 0-9 units TID with meals  HgbA1C - 6.5% CBGs 118, 118, 95 mg/dL  Inpatient Diabetes Program Recommendations:    Agree with orders.  Spoke with pt and husband at bedside regarding new diagnosis DM2 with HgbA1C 6.5%. Pt has been checking blood sugars at home 3-4x/day for awhile. Eats healthy and states she does not want to go on medicine for diabetes. "I'm on enough medicine already for my MS." Husband states she checks blood sugars too often. Pt said yes she was a little obsessive with keeping blood sugars down. I recommended to check 1-2x instead of 4x each day, varying the time. Ordered Living Well diabetes book and pt states she will f/u with PCP. Answered all questions.  Thank you. Ailene Ards, RD, LDN, CDCES Inpatient Diabetes Coordinator [phone]

## 2023-02-04 NOTE — ED Notes (Signed)
Attempted report. Was informed by person answering phone on the unit that RN is in with a rapid response patient and unavailable at this time.

## 2023-02-04 NOTE — Evaluation (Signed)
Physical Therapy Evaluation Patient Details Name: Dana Duke MRN: [id number] DOB: 01/26/1971 Today's Date: 02/04/2023  History of Present Illness  Patient is a 52 year old female with generalized weakness. History of multiple sclerosis, wheelchair-bound, chronic anxiety/depression, migraines, recurrent UTIs  Clinical Impression  Patient is agreeable to PT evaluation. The patient lives with her spouse who was at the bedside throughout session. The patient can ambulate household distances without assistive device at baseline. The wheelchair is only used for community distances.  Today, the patient needed the rolling walker for support for ambulation. Patient ambulated 110ft with rolling walker with one standing rest break required due to leg fatigue. She reports improvement in mobility but not back to baseline. Recommend PT follow up while in the hospital to maximize independence and decrease caregiver burden.       If plan is discharge home, recommend the following: A little help with walking and/or transfers;A little help with bathing/dressing/bathroom;Supervision due to cognitive status;Help with stairs or ramp for entrance;Assistance with cooking/housework   Can travel by private vehicle        Equipment Recommendations Rolling walker (2 wheels)  Recommendations for Other Services       Functional Status Assessment Patient has had a recent decline in their functional status and demonstrates the ability to make significant improvements in function in a reasonable and predictable amount of time.     Precautions / Restrictions Precautions Precautions: Fall Restrictions Weight Bearing Restrictions: No      Mobility  Bed Mobility Overal bed mobility: Needs Assistance Bed Mobility: Supine to Sit, Sit to Supine     Supine to sit: Contact guard          Transfers Overall transfer level: Needs assistance Equipment used: Rolling walker (2 wheels) Transfers: Sit to/from  Stand Sit to Stand: Contact guard assist                Ambulation/Gait Ambulation/Gait assistance: Contact guard assist Gait Distance (Feet): 150 Feet Assistive device: Rolling walker (2 wheels) Gait Pattern/deviations: Decreased stride length, Knee hyperextension - left, Step-through pattern       General Gait Details: one standing rest break with hallway ambulation using rolling walker. cues to monitor for sings of fatigue, taking rest breaks as needed, and other energy conservation strategies. patient is fatigued with activity  Stairs            Wheelchair Mobility     Tilt Bed    Modified Rankin (Stroke Patients Only)       Balance Overall balance assessment: Needs assistance Sitting-balance support: Feet supported Sitting balance-Leahy Scale: Good     Standing balance support: No upper extremity supported, During functional activity Standing balance-Leahy Scale: Fair Standing balance comment: static standing without RW is fair. recommend RW for dynamic activity for safety                             Pertinent Vitals/Pain Pain Assessment Pain Assessment: No/denies pain    Home Living Family/patient expects to be discharged to:: Private residence Living Arrangements: Spouse/significant other Available Help at Discharge: Family Type of Home: House Home Access: Stairs to enter   Secretary/administrator of Steps: 1   Home Layout: Able to live on main level with bedroom/bathroom Home Equipment: Standard Walker;Shower seat;Wheelchair - manual      Prior Function Prior Level of Function : Independent/Modified Independent  Mobility Comments: independent with household ambulation without assistive device. has a wheelchair for community distance ADLs Comments: independent     Extremity/Trunk Assessment   Upper Extremity Assessment Upper Extremity Assessment: Defer to OT evaluation (generally WFL for tasks assessed)     Lower Extremity Assessment Lower Extremity Assessment: Generalized weakness (endurance imparied for sustianed activity in standing)       Communication   Communication Communication: No apparent difficulties  Cognition Arousal: Alert Behavior During Therapy: WFL for tasks assessed/performed Overall Cognitive Status: Within Functional Limits for tasks assessed                                          General Comments      Exercises     Assessment/Plan    PT Assessment Patient needs continued PT services  PT Problem List Decreased strength;Decreased activity tolerance;Decreased balance;Decreased mobility       PT Treatment Interventions DME instruction;Gait training;Stair training;Functional mobility training;Therapeutic activities;Therapeutic exercise;Balance training;Neuromuscular re-education;Patient/family education    PT Goals (Current goals can be found in the Care Plan section)  Acute Rehab PT Goals Patient Stated Goal: to be able to walk without the walker PT Goal Formulation: With patient Time For Goal Achievement: 02/11/23 Potential to Achieve Goals: Good    Frequency Min 1X/week     Co-evaluation               AM-PAC PT "6 Clicks" Mobility  Outcome Measure Help needed turning from your back to your side while in a flat bed without using bedrails?: None Help needed moving from lying on your back to sitting on the side of a flat bed without using bedrails?: A Little Help needed moving to and from a bed to a chair (including a wheelchair)?: A Little Help needed standing up from a chair using your arms (e.g., wheelchair or bedside chair)?: A Little Help needed to walk in hospital room?: A Little Help needed climbing 3-5 steps with a railing? : A Little 6 Click Score: 19    End of Session Equipment Utilized During Treatment: Gait belt Activity Tolerance: Patient tolerated treatment well;Patient limited by fatigue Patient left: in  bed;with call bell/phone within reach;with family/visitor present Nurse Communication: Mobility status PT Visit Diagnosis: Unsteadiness on feet (R26.81);Muscle weakness (generalized) (M62.81)    Time: 2952-8413 PT Time Calculation (min) (ACUTE ONLY): 23 min   Charges:   PT Evaluation $PT Eval Low Complexity: 1 Low   PT General Charges $$ ACUTE PT VISIT: 1 Visit        Donna Bernard, PT, MPT   Ina Homes 02/04/2023, 3:08 PM

## 2023-02-04 NOTE — Progress Notes (Signed)
PHARMACY - PHYSICIAN COMMUNICATION CRITICAL VALUE ALERT - BLOOD CULTURE IDENTIFICATION (BCID)  Dana Duke is an 52 y.o. female who presented to [facility] on 02/03/2023 with a chief complaint of UTI  Assessment:   52 yo female presenting with generalized wekaness and dehydration suspected 2/2 to UTI. Currently being treated with ceftriaxone. BCID positive for E. Coli in 1/4, no resistance identified.  Name of physician (or Provider) Contacted: Dr. Benjamine Mola  Current antibiotics: Ceftriaxone  Changes to prescribed antibiotics recommended:  Patient is on recommended antibiotics - No changes needed  Results for orders placed or performed during the hospital encounter of 02/03/23  Blood Culture ID Panel (Reflexed) (Collected: 02/03/2023  6:44 PM)  Result Value Ref Range   Enterococcus faecalis NOT DETECTED NOT DETECTED   Enterococcus Faecium NOT DETECTED NOT DETECTED   Listeria monocytogenes NOT DETECTED NOT DETECTED   Staphylococcus species NOT DETECTED NOT DETECTED   Staphylococcus aureus (BCID) NOT DETECTED NOT DETECTED   Staphylococcus epidermidis NOT DETECTED NOT DETECTED   Staphylococcus lugdunensis NOT DETECTED NOT DETECTED   Streptococcus species NOT DETECTED NOT DETECTED   Streptococcus agalactiae NOT DETECTED NOT DETECTED   Streptococcus pneumoniae NOT DETECTED NOT DETECTED   Streptococcus pyogenes NOT DETECTED NOT DETECTED   A.calcoaceticus-baumannii NOT DETECTED NOT DETECTED   Bacteroides fragilis NOT DETECTED NOT DETECTED   Enterobacterales DETECTED (A) NOT DETECTED   Enterobacter cloacae complex NOT DETECTED NOT DETECTED   Escherichia coli DETECTED (A) NOT DETECTED   Klebsiella aerogenes NOT DETECTED NOT DETECTED   Klebsiella oxytoca NOT DETECTED NOT DETECTED   Klebsiella pneumoniae NOT DETECTED NOT DETECTED   Proteus species NOT DETECTED NOT DETECTED   Salmonella species NOT DETECTED NOT DETECTED   Serratia marcescens NOT DETECTED NOT DETECTED   Haemophilus  influenzae NOT DETECTED NOT DETECTED   Neisseria meningitidis NOT DETECTED NOT DETECTED   Pseudomonas aeruginosa NOT DETECTED NOT DETECTED   Stenotrophomonas maltophilia NOT DETECTED NOT DETECTED   Candida albicans NOT DETECTED NOT DETECTED   Candida auris NOT DETECTED NOT DETECTED   Candida glabrata NOT DETECTED NOT DETECTED   Candida krusei NOT DETECTED NOT DETECTED   Candida parapsilosis NOT DETECTED NOT DETECTED   Candida tropicalis NOT DETECTED NOT DETECTED   Cryptococcus neoformans/gattii NOT DETECTED NOT DETECTED   CTX-M ESBL NOT DETECTED NOT DETECTED   Carbapenem resistance IMP NOT DETECTED NOT DETECTED   Carbapenem resistance KPC NOT DETECTED NOT DETECTED   Carbapenem resistance NDM NOT DETECTED NOT DETECTED   Carbapenem resist OXA 48 LIKE NOT DETECTED NOT DETECTED   Carbapenem resistance VIM NOT DETECTED NOT DETECTED    Rannie Craney I Dyllan Kats 02/04/2023  11:05 AM

## 2023-02-05 DIAGNOSIS — R531 Weakness: Secondary | ICD-10-CM | POA: Diagnosis not present

## 2023-02-05 LAB — URINE CULTURE: Culture: >=100000 — AB

## 2023-02-05 LAB — GLUCOSE, CAPILLARY
Glucose-Capillary: 132 mg/dL — ABNORMAL HIGH (ref 70–99)
Glucose-Capillary: 123 mg/dL — ABNORMAL HIGH (ref 70–99)
Glucose-Capillary: 119 mg/dL — ABNORMAL HIGH (ref 70–99)
Glucose-Capillary: 154 mg/dL — ABNORMAL HIGH (ref 70–99)

## 2023-02-05 MED ORDER — INSULIN ASPART 100 UNIT/ML IJ SOLN: 0.0000 [IU] | Freq: Three times a day (TID) | INTRAMUSCULAR | Status: DC

## 2023-02-05 MED ORDER — MODAFINIL 100 MG PO TABS
100.0000 mg | ORAL_TABLET | Freq: Every day | ORAL | Status: DC
  Administered 2023-02-05 – 2023-02-07 (×3): 100 mg via ORAL
  Filled 2023-02-05 (×3): qty 1

## 2023-02-05 NOTE — Progress Notes (Signed)
Mobility Specialist: Progress Note   02/05/23 1017  Mobility  Activity Ambulated with assistance in hallway  Level of Assistance Standby assist, set-up cues, supervision of patient - no hands on (+CG for unsteadiness at times)  Assistive Device Front wheel walker  Distance Ambulated (ft) 150 ft  Activity Response Tolerated well  Mobility Referral Yes  $Mobility charge 1 Mobility  Mobility Specialist Start Time (ACUTE ONLY) 0915  Mobility Specialist Stop Time (ACUTE ONLY) [id number]  Mobility Specialist Time Calculation (min) (ACUTE ONLY) 9 min    Pt was agreeable to mobility session - received in bed. SV for bed mobility and STS, SV/CG throughout ambulation. Had c/o consistent knee pain from previous arthroplasty sx and c/o muscle fatigue towards middle of session. Observed some unsteadiness at times that warranted CG but no LOB, otherwise SV. Returned to room without fault. Left in bed with all needs met, call bell in reach. Family in room.   Maurene Capes Mobility Specialist Please contact via SecureChat or Rehab office at [phone]

## 2023-02-05 NOTE — Plan of Care (Signed)

## 2023-02-05 NOTE — Progress Notes (Signed)
Dana Duke  WUJ:[id number] DOB: 1970-12-18 DOA: 02/03/2023 PCP: Harvie Heck, MD    Brief Narrative:  52 year old with a history of multiple sclerosis, chronic anxiety/depression, migraine headaches, and recurrent UTIs who presented to the ER 10/21 with severe generalized weakness and subjective fever with dysuria.  In the ER her temperature was 101 and her UA was positive.  Goals of Care:   Code Status: Full Code   DVT prophylaxis: enoxaparin (LOVENOX) injection 40 mg Start: 02/03/23 1930   Interim Hx: Afebrile.  Vital signs stable.  CBGs well-controlled.  Alert and conversant.  In good spirits.  Very pleasant to talk to.  States she feels she is somewhat stronger than when she first presented.  No new complaints today.  Does report intermittent urinary incontinence which you to this illness as well as some bowel incontinence which is not new.  Assessment & Plan:  Severe generalized weakness - multifactorial Felt to be due to baseline MS exacerbated by acute UTI and dehydration - Neurology recommended monitoring with treatment of acute infection and pursuing further workup if she does not improve -appears to be showing early signs of improvement at this time -continue PT/OT and ambulation with family/nursing  Pansensitive E. coli UTI with bacteremia POA UA at admission consistent with UTI -cultures thus far suggestive of pansensitive E. Coli -continue Rocephin and adjust antibiotic spectrum as cultures dictate  New urinary incontinence Possibly simply a consequence of her UTI plus or minus exacerbation of MS -monitor for improvement with treatment of same  Ambulatory dysfunction in setting of chronic MS Continue PT/OT  Multiple sclerosis Followed by Neurology in the outpatient setting -will need inpatient Neurology consultation if weakness does not improve with treatment of UTI/bacteremia as discussed above  Newly diagnosed DM2 Serum glucose 192 on presentation -no  reported history of DM -A1c 6.5 thereby meeting criteria for formal diagnosis -suspect she will be a candidate for diet control -diabetes education ongoing -spoke with the patient at length about this diagnosis today  Obesity - Body mass index is 31.96 kg/m.   Family Communication: Spoke with patient's daughter (a physician) and her husband at bedside at length today Disposition: Anticipate eventual discharge home   Objective: Blood pressure 108/68, pulse 79, temperature 98.2 F (36.8 C), temperature source Oral, resp. rate 18, height 5\' 6"  (1.676 m), weight 89.8 kg, SpO2 97%.  Intake/Output Summary (Last 24 hours) at 02/05/2023 0904 Last data filed at 02/04/2023 1700 Gross per 24 hour  Intake 264.42 ml  Output --  Net 264.42 ml   Filed Weights   02/03/23 1407  Weight: 89.8 kg    Examination: General: No acute respiratory distress Lungs: Clear to auscultation bilaterally without wheezes or crackles Cardiovascular: Regular rate and rhythm without murmur gallop or rub normal S1 and S2 Abdomen: Nontender, nondistended, soft, bowel sounds positive, no ascites, no appreciable mass Extremities: No significant cyanosis, clubbing, or edema bilateral lower extremities  CBC: Recent Labs  Lab 02/03/23 1412 02/04/23 0210  WBC 9.8 9.7  NEUTROABS 8.0*  --   HGB 12.4 12.7  HCT 38.7 40.5  MCV 83.4 86.7  PLT 278 265   Basic Metabolic Panel: Recent Labs  Lab 02/03/23 1412 02/04/23 0210  NA 139 139  K 3.9 3.8  CL 106 102  CO2 24 22  GLUCOSE 192* 138*  BUN 10 10  CREATININE 0.93 0.84  CALCIUM 8.4* 8.7*  MG  --  1.9  PHOS  --  3.4   GFR: Estimated Creatinine Clearance:  86.4 mL/min (by C-G formula based on SCr of 0.84 mg/dL).   Scheduled Meds:  cholecalciferol  1,000 Units Oral Daily   enoxaparin (LOVENOX) injection  40 mg Subcutaneous Q24H   insulin aspart  0-9 Units Subcutaneous TID WC   Continuous Infusions:  cefTRIAXone (ROCEPHIN)  IV 2 g (02/04/23 1827)      LOS: 2 days   Lonia Blood, MD [facility] Office  [phone] Pager - Text Page per Loretha Stapler  If 7PM-7AM, please contact night-coverage per Amion 02/05/2023, 9:04 AM

## 2023-02-05 NOTE — Evaluation (Signed)
Occupational Therapy Evaluation Patient Details Name: Dana Duke MRN: [id number] DOB: 03-Sep-1970 Today's Date: 02/05/2023   History of Present Illness Patient is a 52 year old female with generalized weakness. History of multiple sclerosis, wheelchair-bound, chronic anxiety/depression, migraines, recurrent UTIs   Clinical Impression   PTA patient modified independent with ADLs and light IADLs, mobility at home without AD and using wheelchair in community.  She was admitted for above and limited by problem list below.  She requires min guard for transfers and in room mobility using RW, up to min guard for ADLs.  Discussed energy conservation techniques, fall prevention and safety during ADLs.  Pt voiced understanding with no questions or concerns.  Will benefit from further OT service acutely and after dc at [facility]: [facility] W level to optimize independence, safety and return to PLOF.        If plan is discharge home, recommend the following: A little help with walking and/or transfers;A little help with bathing/dressing/bathroom;Assistance with cooking/housework;Help with stairs or ramp for entrance;Assist for transportation    Functional Status Assessment  Patient has had a recent decline in their functional status and demonstrates the ability to make significant improvements in function in a reasonable and predictable amount of time.  Equipment Recommendations  None recommended by OT    Recommendations for Other Services       Precautions / Restrictions Precautions Precautions: Fall Restrictions Weight Bearing Restrictions: No      Mobility Bed Mobility Overal bed mobility: Needs Assistance Bed Mobility: Supine to Sit     Supine to sit: Supervision          Transfers Overall transfer level: Needs assistance Equipment used: Rolling walker (2 wheels) Transfers: Sit to/from Stand Sit to Stand: Contact guard assist           General transfer comment: for safety, balance;  cueing for hand placement using RW      Balance Overall balance assessment: Needs assistance Sitting-balance support: Feet supported Sitting balance-Leahy Scale: Good     Standing balance support: No upper extremity supported, During functional activity, Bilateral upper extremity supported Standing balance-Leahy Scale: Fair Standing balance comment: relies on RW dynamically, able to stand without UE support briefly during ADLs with min guard                           ADL either performed or assessed with clinical judgement   ADL Overall ADL's : Needs assistance/impaired     Grooming: Wash/dry hands;Contact guard assist;Standing           Upper Body Dressing : Set up;Sitting   Lower Body Dressing: Contact guard assist;Sit to/from stand Lower Body Dressing Details (indicate cue type and reason): able to don/doff socks, min guard in standing Toilet Transfer: Contact guard assist;Ambulation;Rolling walker (2 wheels)   Toileting- Clothing Manipulation and Hygiene: Contact guard assist;Sit to/from Nurse, children's Details (indicate cue type and reason): simulated tub transfers with min assist, recommended using tub transfer bench at dc Functional mobility during ADLs: Contact guard assist;Rolling walker (2 wheels)       Vision Baseline Vision/History: 1 Wears glasses Vision Assessment?: No apparent visual deficits (glasses for distance)     Perception         Praxis         Pertinent Vitals/Pain Pain Assessment Pain Assessment: 0-10 Pain Score: 7  Pain Location: L knee Pain Descriptors / Indicators: Constant, Discomfort Pain Intervention(s): Repositioned, Monitored  during session, Limited activity within patient's tolerance     Extremity/Trunk Assessment Upper Extremity Assessment Upper Extremity Assessment: Right hand dominant;Generalized weakness   Lower Extremity Assessment Lower Extremity Assessment: Defer to PT evaluation        Communication Communication Communication: No apparent difficulties   Cognition Arousal: Alert Behavior During Therapy: WFL for tasks assessed/performed Overall Cognitive Status: Within Functional Limits for tasks assessed                                       General Comments  daughter and spouse at side, supportive    Exercises     Shoulder Instructions      Home Living Family/patient expects to be discharged to:: Private residence Living Arrangements: Spouse/significant other Available Help at Discharge: Family;Available PRN/intermittently Type of Home: House Home Access: Stairs to enter Entrance Stairs-Number of Steps: 1   Home Layout: Two level;Able to live on main level with bedroom/bathroom;Full bath on main level     Bathroom Shower/Tub: Tub/shower unit;Curtain   Bathroom Toilet: Handicapped height     Home Equipment: Tub bench;Rollator (4 wheels);Wheelchair - manual          Prior Functioning/Environment Prior Level of Function : Independent/Modified Independent;History of Falls (last six months)             Mobility Comments: independent with household ambulation without assistive device. has a wheelchair for community distance ADLs Comments: independent ADLs, light IADLs but spouse completes most cooking and cleaning        OT Problem List: Decreased strength;Decreased activity tolerance;Impaired balance (sitting and/or standing);Decreased knowledge of precautions;Decreased knowledge of use of DME or AE      OT Treatment/Interventions: Self-care/ADL training;Energy conservation;DME and/or AE instruction;Therapeutic activities;Patient/family education;Balance training    OT Goals(Current goals can be found in the care plan section) Acute Rehab OT Goals Patient Stated Goal: home OT Goal Formulation: With patient Time For Goal Achievement: 02/19/23 Potential to Achieve Goals: Good  OT Frequency: Min 1X/week    Co-evaluation               AM-PAC OT "6 Clicks" Daily Activity     Outcome Measure Help from another person eating meals?: None Help from another person taking care of personal grooming?: A Little Help from another person toileting, which includes using toliet, bedpan, or urinal?: A Little Help from another person bathing (including washing, rinsing, drying)?: A Little Help from another person to put on and taking off regular upper body clothing?: A Little Help from another person to put on and taking off regular lower body clothing?: A Little 6 Click Score: 19   End of Session Equipment Utilized During Treatment: Rolling walker (2 wheels) Nurse Communication: Mobility status  Activity Tolerance: Patient tolerated treatment well Patient left: in bed;with call bell/phone within reach;with family/visitor present  OT Visit Diagnosis: Other abnormalities of gait and mobility (R26.89);Muscle weakness (generalized) (M62.81);History of falling (Z91.81)                Time: 1610-9604 OT Time Calculation (min): 17 min Charges:  OT General Charges $OT Visit: 1 Visit OT Evaluation $OT Eval Moderate Complexity: 1 Mod  Barry Brunner, OT Acute Rehabilitation Services Office [phone]   Chancy Milroy 02/05/2023, 10:51 AM

## 2023-02-05 NOTE — Plan of Care (Signed)
  Problem: Education: Goal: Knowledge of General Education information will improve Description: Including pain rating scale, medication(s)/side effects and non-pharmacologic comfort measures Outcome: Progressing   Problem: Health Behavior/Discharge Planning: Goal: Ability to manage health-related needs will improve Outcome: Progressing   Problem: Nutrition: Goal: Adequate nutrition will be maintained Outcome: Progressing   

## 2023-02-06 DIAGNOSIS — R531 Weakness: Secondary | ICD-10-CM | POA: Diagnosis not present

## 2023-02-06 LAB — CULTURE, BLOOD (ROUTINE X 2): Special Requests: ADEQUATE

## 2023-02-06 LAB — GLUCOSE, CAPILLARY
Glucose-Capillary: 113 mg/dL — ABNORMAL HIGH (ref 70–99)
Glucose-Capillary: 171 mg/dL — ABNORMAL HIGH (ref 70–99)
Glucose-Capillary: 90 mg/dL (ref 70–99)
Glucose-Capillary: 117 mg/dL — ABNORMAL HIGH (ref 70–99)
Glucose-Capillary: 112 mg/dL — ABNORMAL HIGH (ref 70–99)

## 2023-02-06 MED ORDER — AMOXICILLIN-POT CLAVULANATE 875-125 MG PO TABS
1.0000 | ORAL_TABLET | Freq: Two times a day (BID) | ORAL | Status: DC
  Administered 2023-02-07: 1 via ORAL
  Filled 2023-02-06: qty 1

## 2023-02-06 MED ORDER — CEFDINIR 300 MG PO CAPS: 300.0000 mg | ORAL_CAPSULE | Freq: Two times a day (BID) | ORAL | Status: DC

## 2023-02-06 NOTE — Progress Notes (Signed)
Mobility Specialist: Progress Note   02/06/23 1211  Mobility  Activity Ambulated with assistance in hallway  Level of Assistance Standby assist, set-up cues, supervision of patient - no hands on  Assistive Device Other (Comment) (IV pole)  Distance Ambulated (ft) 500 ft  Activity Response Tolerated well  Mobility Referral Yes  $Mobility charge 1 Mobility  Mobility Specialist Start Time (ACUTE ONLY) 0947  Mobility Specialist Stop Time (ACUTE ONLY) 1003  Mobility Specialist Time Calculation (min) (ACUTE ONLY) 16 min    Pt was agreeable to mobility session - received in bed. Had c/o fatigue at EOS but toherwise asymptomatic throughout. Stated she had pain but it tolerable. Returned to room without fault. Left on [facility] with all needs met, call bell in reach. Daughter in room.   Maurene Capes Mobility Specialist Please contact via SecureChat or Rehab office at [phone]

## 2023-02-06 NOTE — Progress Notes (Signed)
Physical Therapy Discharge Note Patient Details Name: Dana Duke MRN: [id number] DOB: [birth year] Today's Date: 02/06/2023   History of Present Illness Patient is a 52 year old female with generalized weakness. History of multiple sclerosis, wheelchair-bound, chronic anxiety/depression, migraines, recurrent UTIs    PT Comments  Pt has met all of her goals and is ind to Mod I with all functional activities. Pt previously has some deficits in gait with side to side movements due to previous MS diagnosis. Discussed getting PCP to prescribe OPPT if needed if pt balance deficits get worse. Pt states understanding. Pt will be discharged from skilled physical therapy services at this time. Please re-consult if further needs arise.           Equipment Recommendations  None recommended by PT       Precautions / Restrictions Precautions Precautions: Fall Restrictions Weight Bearing Restrictions: No     Mobility  Bed Mobility Overal bed mobility: Modified Independent Bed Mobility: Supine to Sit, Sit to Supine     Supine to sit: Modified independent (Device/Increase time) Sit to supine: Modified independent (Device/Increase time)   General bed mobility comments: no difficulty HOB slightly elevated    Transfers Overall transfer level: Modified independent Equipment used: None Transfers: Sit to/from Stand Sit to Stand: Modified independent (Device/Increase time)           General transfer comment: slight increase in BOS    Ambulation/Gait Ambulation/Gait assistance: Modified independent (Device/Increase time) Gait Distance (Feet): 200 Feet Assistive device: None Gait Pattern/deviations: Wide base of support Gait velocity: WNL Gait velocity interpretation: >4.37 ft/sec, indicative of normal walking speed   General Gait Details: movement of the trunk with frontal plane sway due to weakness in bil hips, ER at the bil hips and slightly increased BOS. Pt states this is her  baseline and daughter reports this is pt baseline.   Stairs Stairs: Yes Stairs assistance: Modified independent (Device/Increase time) Stair Management: One rail Right Number of Stairs: 1 General stair comments: per home set up       Balance Overall balance assessment: Mild deficits observed, not formally tested Sitting-balance support: Feet supported Sitting balance-Leahy Scale: Normal       Standing balance-Leahy Scale: Good Standing balance comment: no overt LOB with quick turns and steps            Cognition Arousal: Alert Behavior During Therapy: WFL for tasks assessed/performed Overall Cognitive Status: Within Functional Limits for tasks assessed         General Comments General comments (skin integrity, edema, etc.): Daughter present in pt room during session      Pertinent Vitals/Pain Pain Assessment Pain Assessment: Faces Faces Pain Scale: No hurt     PT Goals (current goals can now be found in the care plan section) Acute Rehab PT Goals Patient Stated Goal: to be able to walk without the walker PT Goal Formulation: With patient Time For Goal Achievement: 02/11/23 Potential to Achieve Goals: Good Progress towards PT goals: Goals met/education completed, patient discharged from PT    Frequency    Min 1X/week      PT Plan  Pt will be discharged        AM-PAC PT "6 Clicks" Mobility   Outcome Measure  Help needed turning from your back to your side while in a flat bed without using bedrails?: None Help needed moving from lying on your back to sitting on the side of a flat bed without using bedrails?: None Help needed  moving to and from a bed to a chair (including a wheelchair)?: None Help needed standing up from a chair using your arms (e.g., wheelchair or bedside chair)?: None Help needed to walk in hospital room?: None Help needed climbing 3-5 steps with a railing? : A Little 6 Click Score: 23    End of Session Equipment Utilized  During Treatment: Gait belt Activity Tolerance: Patient tolerated treatment well Patient left: in bed;with call bell/phone within reach;with family/visitor present Nurse Communication: Mobility status PT Visit Diagnosis: Unsteadiness on feet (R26.81);Muscle weakness (generalized) (M62.81)     Time: 5284-1324 PT Time Calculation (min) (ACUTE ONLY): 8 min  Charges:    $Therapeutic Activity: 8-22 mins PT General Charges $$ ACUTE PT VISIT: 1 Visit                     Harrel Carina, DPT, CLT  Acute Rehabilitation Services Office: [phone] (Secure chat preferred)    Claudia Desanctis 02/06/2023, 3:36 PM

## 2023-02-06 NOTE — Plan of Care (Signed)
  Problem: Education: Goal: Knowledge of General Education information will improve Description: Including pain rating scale, medication(s)/side effects and non-pharmacologic comfort measures Outcome: Progressing   Problem: Health Behavior/Discharge Planning: Goal: Ability to manage health-related needs will improve Outcome: Progressing   Problem: Activity: Goal: Risk for activity intolerance will decrease Outcome: Progressing   

## 2023-02-06 NOTE — Progress Notes (Signed)
Dana Duke  ZOX:[id number] DOB: 08/27/1970 DOA: 02/03/2023 PCP: Harvie Heck, MD    Brief Narrative:  52 year old with a history of multiple sclerosis, chronic anxiety/depression, migraine headaches, and recurrent UTIs who presented to the ER 10/21 with severe generalized weakness and subjective fever with dysuria.  In the ER her temperature was 101 and her UA was positive.  Goals of Care:   Code Status: Full Code   DVT prophylaxis: enoxaparin (LOVENOX) injection 40 mg Start: 02/03/23 1930   Interim Hx: No acute events recorded overnight.  Afebrile.  Vital signs stable.  Oxygen saturation 99% room air.  CBG well-controlled.  Reports that she is feeling significantly better today.  Has walked up and down the long hallway on the unit.  Strength significantly improved.  Ongoing issues with urinary incontinence (a new issue) and occasional fecal incontinence (which is not new).  Overall she is in very good spirits and feeling much better.  Assessment & Plan:  Severe generalized weakness - multifactorial Felt to be due to baseline MS exacerbated by acute UTI and dehydration - Neurology recommended monitoring with treatment of acute infection and pursuing further workup if she does not improve - has improved markedly over last 24hrs as note above   Pansensitive E. coli UTI with bacteremia POA UA at admission consistent with UTI -cultures thus far suggestive of pansensitive E. Coli -continue Rocephin today, and transition to oral abx tomorrow, with planned 14 days total of abx tx   New urinary incontinence Possibly simply a consequence of her UTI plus or minus exacerbation of MS - no improvement thus far - to f/u with her Uro/Gyn as an outpt if persists   Ambulatory dysfunction in setting of chronic MS Continue PT/OT - improving nicely   Multiple sclerosis Followed by Neurology in the outpatient setting - to continue on usual outpt f/u schedule after d/c   Newly diagnosed  DM2 Serum glucose 192 on presentation -no reported history of DM -A1c 6.5 thereby meeting criteria for formal diagnosis -suspect she will be a candidate for diet control -diabetes education ongoing -spoke with the patient at length about this diagnosis 10/23 - weight loss advised   Obesity - Body mass index is 31.96 kg/m.   Family Communication: Spoke with daughter at bedside Disposition: Anticipate eventual discharge home   Objective: Blood pressure 126/85, pulse 69, temperature 98.3 F (36.8 C), temperature source Oral, resp. rate 16, height 5\' 6"  (1.676 m), weight 89.8 kg, SpO2 99%.  Intake/Output Summary (Last 24 hours) at 02/06/2023 0935 Last data filed at 02/05/2023 1500 Gross per 24 hour  Intake 100 ml  Output --  Net 100 ml   Filed Weights   02/03/23 1407  Weight: 89.8 kg    Examination: General: No acute respiratory distress Lungs: Clear to auscultation bilaterally without wheezes or crackles Cardiovascular: RRR without murmur Abdomen: NT/ND, soft, BS positive Extremities: No significant cyanosis, clubbing, or edema bilateral lower extremities  CBC: Recent Labs  Lab 02/03/23 1412 02/04/23 0210  WBC 9.8 9.7  NEUTROABS 8.0*  --   HGB 12.4 12.7  HCT 38.7 40.5  MCV 83.4 86.7  PLT 278 265   Basic Metabolic Panel: Recent Labs  Lab 02/03/23 1412 02/04/23 0210  NA 139 139  K 3.9 3.8  CL 106 102  CO2 24 22  GLUCOSE 192* 138*  BUN 10 10  CREATININE 0.93 0.84  CALCIUM 8.4* 8.7*  MG  --  1.9  PHOS  --  3.4   GFR:  Estimated Creatinine Clearance: 86.4 mL/min (by C-G formula based on SCr of 0.84 mg/dL).   Scheduled Meds:  cholecalciferol  1,000 Units Oral Daily   enoxaparin (LOVENOX) injection  40 mg Subcutaneous Q24H   insulin aspart  0-6 Units Subcutaneous TID WC   modafinil  100 mg Oral Daily   Continuous Infusions:  cefTRIAXone (ROCEPHIN)  IV 2 g (02/05/23 2030)     LOS: 3 days   Lonia Blood, MD [facility] Office   [phone] Pager - Text Page per Loretha Stapler  If 7PM-7AM, please contact night-coverage per Amion 02/06/2023, 9:35 AM

## 2023-02-07 ENCOUNTER — Other Ambulatory Visit (HOSPITAL_COMMUNITY): Payer: Self-pay

## 2023-02-07 DIAGNOSIS — R531 Weakness: Secondary | ICD-10-CM | POA: Diagnosis not present

## 2023-02-07 LAB — GLUCOSE, CAPILLARY: Glucose-Capillary: 115 mg/dL — ABNORMAL HIGH (ref 70–99)

## 2023-02-07 MED ORDER — ACETAMINOPHEN 325 MG PO TABS: 650.0000 mg | ORAL_TABLET | Freq: Four times a day (QID) | ORAL | Status: AC | PRN

## 2023-02-07 MED ORDER — AMOXICILLIN-POT CLAVULANATE 875-125 MG PO TABS
1.0000 | ORAL_TABLET | Freq: Two times a day (BID) | ORAL | 0 refills | Status: AC
  Filled 2023-02-07: qty 20, 10d supply, fill #0

## 2023-02-07 NOTE — Progress Notes (Signed)
Occupational Therapy Treatment Patient Details Name: Dana Duke MRN: [id number] DOB: Jul 07, 1970 Today's Date: 02/07/2023   History of present illness Patient is a 52 year old female with generalized weakness. History of multiple sclerosis, wheelchair-bound, chronic anxiety/depression, migraines, recurrent UTIs   OT comments  Patient received seated up in recliner and had already completed dressing. Patient performed mobility in room and in hallway without an assistive device with no LOB but performed with RW with improved balance and posture. Patient states she would like a RW for home. Patient performed tub bench transfer with supervision with patient stating she has one at home. Discussed discharge plan and OP therapy patient stated should would like HHOT/PT if possible before transitioning to outpatient therapy. Acute OT to continue to follow.       If plan is discharge home, recommend the following:  A little help with walking and/or transfers;A little help with bathing/dressing/bathroom;Assistance with cooking/housework;Help with stairs or ramp for entrance;Assist for transportation   Equipment Recommendations  Other (comment) (RW)    Recommendations for Other Services      Precautions / Restrictions Precautions Precautions: Fall Restrictions Weight Bearing Restrictions: No       Mobility Bed Mobility Overal bed mobility: Modified Independent             General bed mobility comments: OOB in recliner    Transfers Overall transfer level: Modified independent Equipment used: Rolling walker (2 wheels), None Transfers: Sit to/from Stand Sit to Stand: Modified independent (Device/Increase time)           General transfer comment: Patient performed transfer in room without RW and performed mobility with RW with improved balance     Balance Overall balance assessment: Mild deficits observed, not formally tested Sitting-balance support: Feet supported Sitting  balance-Leahy Scale: Normal       Standing balance-Leahy Scale: Good Standing balance comment: no overt LOB with quick turns and steps                           ADL either performed or assessed with clinical judgement   ADL Overall ADL's : Needs assistance/impaired                     Lower Body Dressing: Supervision/safety;Sitting/lateral leans Lower Body Dressing Details (indicate cue type and reason): changed socks Toilet Transfer: Modified Independent       Tub/ Shower Transfer: Supervision/safety;Tub transfer;Tub bench Tub/Shower Transfer Details (indicate cue type and reason): performed tub bench transfer with supervision   General ADL Comments: Patient up in recliner and fully dressed    Extremity/Trunk Assessment              Vision       Perception     Praxis      Cognition Arousal: Alert Behavior During Therapy: WFL for tasks assessed/performed Overall Cognitive Status: Within Functional Limits for tasks assessed                                          Exercises      Shoulder Instructions       General Comments patient stating she would like HH therapy before going to OP    Pertinent Vitals/ Pain       Pain Assessment Pain Assessment: Faces Faces Pain Scale: No hurt  Home Living  Prior Functioning/Environment              Frequency  Min 1X/week        Progress Toward Goals  OT Goals(current goals can now be found in the care plan section)  Progress towards OT goals: Progressing toward goals  Acute Rehab OT Goals Patient Stated Goal: go home OT Goal Formulation: With patient Time For Goal Achievement: 02/19/23 Potential to Achieve Goals: Good ADL Goals Pt Will Perform Grooming: with modified independence;standing Pt Will Perform Lower Body Dressing: with modified independence;sit to/from stand Pt Will Transfer to Toilet: with  modified independence;ambulating Pt Will Perform Toileting - Clothing Manipulation and hygiene: with modified independence;sit to/from stand Pt Will Perform Tub/Shower Transfer: Tub transfer;with modified independence;tub bench;ambulating;rolling walker Additional ADL Goal #1: Pt will utilize energy conservation techniques during ADL routine to optimize tolerance and safety.  Plan      Co-evaluation                 AM-PAC OT "6 Clicks" Daily Activity     Outcome Measure   Help from another person eating meals?: None Help from another person taking care of personal grooming?: A Little Help from another person toileting, which includes using toliet, bedpan, or urinal?: A Little Help from another person bathing (including washing, rinsing, drying)?: A Little Help from another person to put on and taking off regular upper body clothing?: A Little Help from another person to put on and taking off regular lower body clothing?: A Little 6 Click Score: 19    End of Session Equipment Utilized During Treatment: Rolling walker (2 wheels)  OT Visit Diagnosis: Other abnormalities of gait and mobility (R26.89);Muscle weakness (generalized) (M62.81);History of falling (Z91.81)   Activity Tolerance Patient tolerated treatment well   Patient Left in chair;with call bell/phone within reach   Nurse Communication Mobility status        Time: 4034-7425 OT Time Calculation (min): 20 min  Charges: OT General Charges $OT Visit: 1 Visit OT Treatments $Self Care/Home Management : 8-22 mins  Alfonse Flavors, OTA Acute Rehabilitation Services  Office [phone]   Dewain Penning 02/07/2023, 12:03 PM

## 2023-02-07 NOTE — Discharge Summary (Signed)
DISCHARGE SUMMARY  Dana Duke  MR#: [id number]  DOB:[birth year]  Date of Admission: 02/03/2023 Date of Discharge: 02/07/2023  Attending Physician:Nicoya Friel Silvestre Gunner, MD  Patient's [name], [name], MD  Disposition: Discharge home  Follow-up Appts:  Follow-up Information     Harvie Heck, MD Follow up in 10 day(s).   Specialty: Family Medicine Contact information: [address] [place] [address] [place] [address] [phone]         Your UroGyn Provider Follow up.   Why: Schedule an appointment with your UroGyn Provider for a workup as soon as posisble.        Your Neurologist Follow up.   Why: Schedule a visit with your Neurologist for a follow up of your MS as soon as possible.                Issues Needing Follow-up: -monitoring of A1c and encouragment on following DM diet/weight loss is suggested - recheck A1c in 3 months will be indicated  -assure patient has arranged outpt f/u w/ her UroGyn Provider to assess her urinary incontinence  -assure patient has completed her full 14 day course of abx and has no persisting sx of infection  Discharge Diagnoses: Severe generalized weakness - multifactorial Pansensitive E. coli UTI with bacteremia POA New urinary incontinence - chronic fecal incontinence  Ambulatory dysfunction in setting of chronic MS Multiple sclerosis Newly diagnosed DM2 Obesity - Body mass index is 31.96 kg/m.  Initial presentation: 52 year old with a history of multiple sclerosis, chronic anxiety/depression, migraine headaches, and recurrent UTIs who presented to the ER 10/21 with severe generalized weakness and subjective fever with dysuria. In the ER her temperature was 101 and her UA was positive.   Hospital Course:  Severe generalized weakness - multifactorial Felt to be due to baseline MS exacerbated by acute UTI and dehydration - Neurology recommended monitoring with treatment of acute infection and  pursuing further workup only if she did not improve - has improved markedly over the course of her hospital stay, able to ambulate long distances independently prior to d/c home    Pansensitive E. coli UTI with bacteremia POA UA at admission consistent with UTI -cultures revealed pansensitive E. Coli (though only intermediate sensitivity to 1st gen ceph on one culture) - treated w/ IV Rocephin while hospitalized, and transitioned to oral abx on day of d/c, with planned 14 days total of abx tx    New urinary incontinence Possibly simply a consequence of her UTI plus or minus exacerbation of MS - no improvement thus far - to f/u with her Uro/Gyn as an outpt if persists    Ambulatory dysfunction in setting of chronic MS Resolved at time of d/c    Multiple sclerosis Followed by Neurology in the outpatient setting - to continue on usual outpt f/u schedule after d/c    Newly diagnosed DM2  Serum glucose 192 on presentation - no reported history of DM - A1c 6.5 this admit thereby meeting criteria for formal diagnosis of DM - suspect she will be a candidate for diet control long term - diabetes education ongoing -spoke with the patient at length about this diagnosis 10/23 - weight loss advised    Obesity - Body mass index is 31.96 kg/m.  Allergies as of 02/07/2023       Reactions   Sulpiride Nausea Only   Other Reaction(s): vomiting   Sulfa Antibiotics Nausea And Vomiting   Sulfacetamide Sodium    Other Reaction(s): GI Intolerance  Medication List     TAKE these medications    acetaminophen 325 MG tablet Commonly known as: TYLENOL Take 2 tablets (650 mg total) by mouth every 6 (six) hours as needed (for mild pain, fever, or headache).   Ajovy 225 MG/1.5ML Soaj Generic drug: Fremanezumab-vfrm 1.67ml into the skin once a month   amoxicillin-clavulanate 875-125 MG tablet Commonly known as: AUGMENTIN Take 1 tablet by mouth every 12 (twelve) hours for 10 days.    Cholecalciferol 25 MCG (1000 UT) tablet Take 1,000 Units by mouth daily.   methenamine 1 g tablet Commonly known as: HIPREX Take 1 g by mouth 2 (two) times daily.   modafinil 100 MG tablet Commonly known as: Provigil Take 1 tablet (100 mg total) by mouth daily.   Ocrevus 300 MG/10ML injection Generic drug: ocrelizumab Inject into the vein.   Premarin vaginal cream Generic drug: conjugated estrogens Place 1 applicator vaginally 2 (two) times a week.   promethazine 12.5 MG tablet Commonly known as: PHENERGAN Take 12.5 mg by mouth every 6 (six) hours as needed.   RED YEAST RICE PO Take by mouth.   Ubrelvy 50 MG Tabs Generic drug: Ubrogepant TAKE 1 TABLET BY MOUTH AT ONSET OF MIGRAINE, IF SYMPTOMS PERSIST, A SECOND DOSE MAY BE TAKEN IN 2 HOURS   VITA-C PO Take by mouth.               Durable Medical Equipment  (From admission, onward)           Start     Ordered   02/07/23 1011  For home use only DME Walker rolling  Once       Question Answer Comment  Walker: With 5 Inch Wheels   Patient needs a walker to treat with the following condition Gait instability      02/07/23 1010            Day of Discharge BP 120/77 (BP Location: Right Arm)   Pulse 70   Temp 97.7 F (36.5 C) (Oral)   Resp 16   Ht 5\' 6"  (1.676 m)   Wt 89.8 kg   SpO2 98%   BMI 31.96 kg/m   Physical Exam: General: No acute respiratory distress Lungs: Clear to auscultation bilaterally without wheezes or crackles Cardiovascular: Regular rate and rhythm without murmur gallop or rub normal S1 and S2 Abdomen: Nontender, nondistended, soft, bowel sounds positive, no rebound, no ascites, no appreciable mass Extremities: No significant cyanosis, clubbing, or edema bilateral lower extremities  Basic Metabolic Panel: Recent Labs  Lab 02/03/23 1412 02/04/23 0210  NA 139 139  K 3.9 3.8  CL 106 102  CO2 24 22  GLUCOSE 192* 138*  BUN 10 10  CREATININE 0.93 0.84  CALCIUM 8.4* 8.7*   MG  --  1.9  PHOS  --  3.4    CBC: Recent Labs  Lab 02/03/23 1412 02/04/23 0210  WBC 9.8 9.7  NEUTROABS 8.0*  --   HGB 12.4 12.7  HCT 38.7 40.5  MCV 83.4 86.7  PLT 278 265    Time spent in discharge (includes decision making & examination of pt): 35 minutes  02/07/2023, 11:11 AM   Lonia Blood, MD [facility] Office  [phone]

## 2023-02-07 NOTE — Progress Notes (Signed)
   02/07/23 1206  Mobility  Activity Ambulated independently in hallway  Level of Assistance Modified independent, requires aide device or extra time  Assistive Device Front wheel walker  Distance Ambulated (ft) 500 ft  Activity Response Tolerated well  Mobility Referral Yes  $Mobility charge 1 Mobility  Mobility Specialist Start Time (ACUTE ONLY) 1111  Mobility Specialist Stop Time (ACUTE ONLY) 1119  Mobility Specialist Time Calculation (min) (ACUTE ONLY) 8 min   Mobility Specialist: Progress Note  Pt agreeable to mobility session - received in EOB. Required ModI using RW. Pt was asymptomatic throughout session with no complaints. Returned to EOB  with all needs met - call bell within reach.   Barnie Mort, BS Mobility Specialist Please contact via SecureChat or Rehab office at [phone].

## 2023-02-10 ENCOUNTER — Encounter: Payer: Self-pay | Admitting: Neurology

## 2023-02-23 ENCOUNTER — Other Ambulatory Visit: Payer: Self-pay | Admitting: Neurology

## 2023-03-17 ENCOUNTER — Other Ambulatory Visit: Payer: Self-pay

## 2023-03-17 MED ORDER — UBRELVY 50 MG PO TABS: ORAL_TABLET | ORAL | 0 refills | Status: DC

## 2023-03-18 ENCOUNTER — Encounter: Payer: Self-pay | Admitting: Neurology

## 2023-03-18 ENCOUNTER — Ambulatory Visit (INDEPENDENT_AMBULATORY_CARE_PROVIDER_SITE_OTHER): Payer: BC Managed Care – PPO | Admitting: Neurology

## 2023-03-18 VITALS — BP 132/84 | HR 65 | Ht 66.0 in | Wt 199.6 lb

## 2023-03-18 DIAGNOSIS — R531 Weakness: Secondary | ICD-10-CM

## 2023-03-18 DIAGNOSIS — G35 Multiple sclerosis: Secondary | ICD-10-CM | POA: Diagnosis not present

## 2023-03-18 DIAGNOSIS — G43009 Migraine without aura, not intractable, without status migrainosus: Secondary | ICD-10-CM | POA: Diagnosis not present

## 2023-03-18 DIAGNOSIS — R5383 Other fatigue: Secondary | ICD-10-CM

## 2023-03-18 DIAGNOSIS — R269 Unspecified abnormalities of gait and mobility: Secondary | ICD-10-CM

## 2023-03-18 MED ORDER — MODAFINIL 200 MG PO TABS: 200.0000 mg | ORAL_TABLET | Freq: Every day | ORAL | 1 refills | Status: AC

## 2023-03-18 MED ORDER — AJOVY 225 MG/1.5ML ~~LOC~~ SOAJ: SUBCUTANEOUS | 3 refills | Status: DC

## 2023-03-18 MED ORDER — UBRELVY 50 MG PO TABS: ORAL_TABLET | ORAL | 3 refills | Status: DC

## 2023-03-18 NOTE — Patient Instructions (Signed)
Increase Provigil 200 mg in the morning.  Check labs today.  Have your primary care doctor recheck your A1c.  Recommend physical therapy, exercise for muscle strengthening.  We will continue Ocrevus.  Follow-up in 6 months.

## 2023-03-18 NOTE — Progress Notes (Signed)
ASSESSMENT AND PLAN 52 y.o. year old female with history of relapsing remitting multiple sclerosis.  Has underlying chronic fatigue.  Also chronic migraine headaches.  Admitted in October 2024 with UTI, fever, presented with significant weakness, had good improvement with fluids/IV antibiotics.  Has continued with report of weakness, gait abnormality.  On exam, I do not see much difference from baseline.  Had MRI of the brain and cervical spine imaging in July 2024 showing stable MS lesions supratentorial and infratentorial, C3, C6, C7 levels, cervical medullary junction.  -Continue Ocrevus, next infusion January 2025 -Check CBC, CMP, IgG IgM IgA -Increase Provigil 200 mg in AM for fatigue -Continue follow-up with GYN urology, has bladder stimulator for urinary and bowel incontinence -Have PCP recheck A1c, potentially new onset type 2 diabetes contributed to weakness, UTI admission -Recommended physical therapy, wishes to hold off -Migraines under very good control, continue current regimen of Ajovy, Ubrelvy -Follow-up in 6 months with Dr. Terrace Arabia  DIAGNOSTIC DATA (LABS, IMAGING, TESTING) - I reviewed patient records, labs, notes, testing and imaging myself where available.    HISTORY  Dana Duke is a 52 year old female, follow-up for relapsing remitting multiple sclerosis, primary care physician is Dr. Jean Rosenthal, Caroline More, MD   Since 2011 she noticed gradual onset slow worsening gait difficulty, she tends to drag her foot across the floor, stiff, has difficulty going upstairs, because of knee pain, she suffered a history of right ankle fracture in 2010, seems to dragging her right foot across the floor more, also complains urinary urgency, no bowel incontinence   MRI at [facility] in May 7th 2015, without contrast   MRI cervical spine (without) demonstrating at least 4 spinal cord T2 hyperintensities at cervico-medullary junction, C2, C3-4 and C6.  MRI brain (without) demonstrating  multiple (> 20) round and ovoid, periventricular, callosal and peri-callosal, subcortical, juxtacortical, pontine and cerebellar T2 hyperintensities. Some of these are perpendicular to the lateral ventricles on axial and sagittal views. Some of these are hypointense on T1 views. Findings suspicious for demyelinating disease   CSF showed 5 oligoclonal banding, elevated IgG index   Extensive laboratory evaluations showed normal or negative CBC, CMP, Lyme titer, ANA, NMO, TSH, B12, folic acid, RPR. Positive varicellar zoster antibody   The visual evoked response test above was within normal limits on the left, with prolongation of the P100 latency on the right.    JC virus antibody was 0.15 negative in 10/18/2013   MRI of brain, and cervical spine with contrast, showed no contrast enhancement, there was no lesions noticed in MRI thoracic spine,   She started Plegridy since July 2015,  began full dose injection since Sept 15 2015. Complains of injection site reactio, lasting 2-4 weeks, she only has mild flu like illness     She was started on prednisone  tapering since October 16,2015 for worsening gait abnormality, fall,  tolerating the medication well, seems to have mild improvement in her gait difficulty   She also complains of frequent headaches, consistent with migraines, previously tried nortriptyline 20 mg at night as preventive medication    Around 2016, she began to noticed increased lower extremity spasticity, gait abnormality, also developed urinary frequency, urgency,    MRI of the brain with and without contrast in November 2016:  abnormal MRI of the brain with and without contrast showing T2/FLAIR hyperintense foci at the cervicomedullary junction and within the pons, midbrain and cerebellum in the posterior fossa. Additionally, there are many T2/FLAIR hyperintense foci in the cerebral  hemispheres, predominantly in the periventricular white matter.  The foci are consistent with chronic  demyelinating plaques from multiple sclerosis.     None of the foci appears to be acute and there are no enhancing lesions.    abnormal MRI of the cervical spine showing T2 hyperintense foci within the spinal cord at the cervicomedullary junction, C2, C3, C5C6 and C6 as detailed above.   Additional foci are noted within the pons.  None of the foci appears to be acute. No significant degenerative changes are noted.   The lesions are consistent with chronic demyelinating plaques as would be seen in multiple sclerosis. The findings are similar to the MRI report from 08/19/2013.     She started Tysarbri infusion since January 2017,    MRI of the brain without contrast in November 2017:Abnormal MRI scan of the brain with and without contrast showing multiple periventricular, periatrial, subcortical and brainstem white matter hyperintensities consistent with chronic demyelinating disease. No enhancing lesions are noted. Overall no significant change compared with previous MRI scan dated 03/01/2015   Over the years, she has increased gait abnormality gait abnormality, failed to respond well to Ampyra,    MRI cervical spine 01/02/2018, demyelinating plaque C2, 3, C5-6 and 7 level, no change compared to MRI in 2016   MRI of the brain with without contrast few small round periventricular subcortical, pontine, and cerebellar demyelinating plaque, no acute findings no change compared to MRI in 2018   Laboratory evaluation showed negative anti-Tysarbri antibody,    Started ocrelizumab since fall of  2020   MRI of the brain without contrast February 12, 2019, multiple T2/flair hyperintensity foci in the brainstem, spinal cord, cerebellum, cerebral hemisphere, in a pattern, and configuration, consistent with chronic demyelinating plaque associated with multiple sclerosis.  There was no change compared to previous MRI in November 2019.  No contrast-enhancement   UPDATE Jan 7th 2021: She came to clinic earlier  than expected for worsening migraine headaches, she had 7 days of migraine around Christmas time, had recurrent migraine again over the past 4 days, failed multiple home dose of Imitrex, today she still complains 6 out of 10 right temporal area pounding headache, worsening with movement, light noise sensitivity, with mild nausea   Her headache failed to improve with 50 mg  Ubrevyl sample, but much improved after she was given IV Depacon,,along with 10 mg of Compazine Toradol 30 mg   UPDATE December 05 2020: She had right eye pain, was seen by ophthalmology today, diagnosed was right optic neuritis, but she denies visual change, was giving a steroid Dosepak H,  She still complains of increased migraine headache over the past 2 months, almost daily basis, aimovig monthly does not help,   Continue complains worsening gait abnormality, has worsening bowel and bladder incontinence, is seen by urologist, pelvic physical therapy,   Also complains of increased to fatigue,  UPDATE October 08 2022: She is accompanied by her husband at today's visit, overall stable, gait abnormality but no worsening, recently reviewed MRI of the brain and cervical spine from 2022, extensive cervical cord involvement, stable,  Tolerating ocrelizumab infusion, last infusion was in January 2024,  US abdomen in June 2024 for evaluation of right abdominal pain 1.No acute intra-abdominal pathology.  2.  Hepatic steatosis.  3.  Benign-appearing subcentimeter right renal cyst.   Update March 18, 2023 SS: Admitted October 2024 with fever, generalized weakness, positive UTI, new diagnosis type 2 diabetes A1c 6.5.  Weakness improved with IV fluids  and antibiotics. Had been on prednisone prior for knee pain (could have contributed to A1C).   Dr. Terrace Arabia ordered MRI of the brain and cervical spine July 2024, stable multiple supratentorial and infratentorial MS plaques in the brain and C-spine.  Next Ocrevus is in January. She still  feels exhausted, tired, weak. Has bowel incontinence, urinary urgency. Sees uro/gynecologist.  Has stimulator for bladder/bowel incontinence, settings changed. Sees GI for constipation, has been given IBSRELA, often struggles with constipation vs diarrhea.   Urine recheck 02/19/23 was normal. Takes Provigil 100 mg in AM, helps, but still fatigued. Migraines doing well, on Ajovy, takes Bernita Raisin, gets 1 migraine a week.   PHYSICAL EXAM  Vitals:   10/08/22 1500 10/08/22 1508  BP: (!) 145/92 (!) 134/90  Pulse: 68 66  Weight: 198 lb (89.8 kg)   Height: 5\' 6"  (1.676 m)     Body mass index is 31.96 kg/m.   PHYSICAL EXAMNIATION:  Gen: NAD, conversant, well nourised, well groomed                     Cardiovascular: Regular rate rhythm, no peripheral edema, warm, nontender. Eyes: Conjunctivae clear without exudates or hemorrhage Pulmonary: Clear to auscultation bilaterally   NEUROLOGICAL EXAM:  MENTAL STATUS: Speech/cognition: Awake, alert oriented to history taking and casual conversation  CRANIAL NERVES: CN II: Visual fields are full to confrontation.  Pupils are round equal and briskly reactive to light. CN III, IV, VI: extraocular movement are normal. No ptosis. CN V: Facial sensation is intact to soft touch CN VII: Face is symmetric with normal eye closure and smile. CN VIII: Hearing is normal to casual conversation CN XI: Head turning and shoulder shrug are intact   MOTOR: Mild to moderate spasticity of lower extremity.  No significant muscle weakness noted.  REFLEXES: Reflexes are 2 and symmetric at the biceps, triceps, knees, and ankles. Plantar responses are flexor.  SENSORY: Intact to light touch  COORDINATION: Rapid alternating movements and fine finger movements are intact. There is no dysmetria on finger-to-nose and heel-knee-shin.    GAIT/STANCE: Push-up to get up from seated position, stiff, dragging left more than right   REVIEW OF SYSTEMS: Out of a complete 14  system review of symptoms, the patient complains only of the following symptoms, and all other reviewed systems are negative.  See HPI  ALLERGIES: Allergies  Allergen Reactions   Sulpiride Nausea Only    Other Reaction(s): vomiting   Sulfa Antibiotics Nausea And Vomiting   Sulfacetamide Sodium     Other Reaction(s): GI Intolerance    HOME MEDICATIONS: Outpatient Medications Prior to Visit  Medication Sig Dispense Refill   acetaminophen (TYLENOL) 325 MG tablet Take 2 tablets (650 mg total) by mouth every 6 (six) hours as needed (for mild pain, fever, or headache).     Ascorbic Acid (VITA-C PO) Take by mouth.     Cholecalciferol 25 MCG (1000 UT) tablet Take 1,000 Units by mouth daily.     Fremanezumab-vfrm (AJOVY) 225 MG/1.5ML SOAJ 1.36ml into the skin once a month 1.5 mL 11   methenamine (HIPREX) 1 g tablet Take 1 g by mouth 2 (two) times daily.     modafinil (PROVIGIL) 100 MG tablet Take 1 tablet (100 mg total) by mouth daily. 90 tablet 3   ocrelizumab (OCREVUS) 300 MG/10ML injection Inject into the vein.     PREMARIN vaginal cream Place 1 applicator vaginally 2 (two) times a week.     promethazine (PHENERGAN) 12.5 MG  tablet Take 12.5 mg by mouth every 6 (six) hours as needed.     Red Yeast Rice Extract (RED YEAST RICE PO) Take by mouth.     Ubrogepant (UBRELVY) 50 MG TABS TAKE ONE TABLET BY MOUTH AT ONSET OF MIGRAINE. IF SYMPTOMS PERSIST, A SECOND DOSE MAY BE TAKEN IN 2 HOURS 15 tablet 0   Facility-Administered Medications Prior to Visit  Medication Dose Route Frequency Provider Last Rate Last Admin   gadopentetate dimeglumine (MAGNEVIST) injection 20 mL  20 mL Intravenous Once PRN Levert Feinstein, MD        PAST MEDICAL HISTORY: Past Medical History:  Diagnosis Date   Ankle fracture, right    History of hysterectomy    Knee joint dislocation    bilateral   Migraines    Multiple sclerosis (HCC)     PAST SURGICAL HISTORY: Past Surgical History:  Procedure Laterality Date    ABDOMINAL HYSTERECTOMY     CESAREAN SECTION     MYOMECTOMY      FAMILY HISTORY: Family History  Problem Relation Age of Onset   Diabetes Mother    Osteoarthritis Other    Heart disease Other    Rheumatic fever Other    Hypertension Other     SOCIAL HISTORY: Social History   Socioeconomic History   Marital status: Married    Spouse name: Merchant navy officer   Number of children: 1   Years of education: college   Highest education level: Not on file  Occupational History    Employer: [employer]    Comment: Does not work  Tobacco Use   Smoking status: Never   Smokeless tobacco: Never  Vaping Use   Vaping status: Never Used  Substance and Sexual Activity   Alcohol use: No   Drug use: No   Sexual activity: Yes    Birth control/protection: Surgical  Other Topics Concern   Not on file  Social History Narrative   Patient is married. [name]).    Patient is unemployed.   Education- College education   Right handed.   Caffeine- None   Social Determinants of Health   Financial Resource Strain: Low Risk  (08/01/2022)   Received from [facility]   Overall Financial Resource Strain (CARDIA)    Difficulty of Paying Living Expenses: Not hard at all  Food Insecurity: No Food Insecurity (02/03/2023)   Hunger Vital Sign    Worried About Running Out of Food in the Last Year: Never true    Ran Out of Food in the Last Year: Never true  Transportation Needs: No Transportation Needs (02/03/2023)   PRAPARE - Administrator, Civil Service (Medical): No    Lack of Transportation (Non-Medical): No  Physical Activity: Inactive (08/01/2022)   Received from [facility]   Exercise Vital Sign    Days of Exercise per Week: 0 days    Minutes of Exercise per Session: 10 min  Stress: No Stress Concern Present (08/01/2022)   Received from [facility] - Occupational Stress Questionnaire    Feeling  of Stress : Not at all  Social Connections: Socially Integrated (08/01/2022)   Received from [facility], [facility]   Social Network    How would you rate your social network (family, work, friends)?: Good participation with social networks  Intimate Partner Violence: Not At Risk (02/03/2023)   Humiliation, Afraid, Rape, and Kick questionnaire    Fear of Current  or Ex-Partner: No    Emotionally Abused: No    Physically Abused: No    Sexually Abused: No   Otila Kluver, DNP  [facility] [address], [address] [place], [place] [address] [phone]

## 2023-03-19 LAB — IGG, IGA, IGM
IgA/Immunoglobulin A, Serum: 125 mg/dL (ref 87–352)
IgG (Immunoglobin G), Serum: 1307 mg/dL (ref 586–1602)
IgM (Immunoglobulin M), Srm: 89 mg/dL (ref 26–217)

## 2023-03-19 LAB — COMPREHENSIVE METABOLIC PANEL
ALT: 11 [IU]/L (ref 0–32)
AST: 15 [IU]/L (ref 0–40)
Albumin: 4.2 g/dL (ref 3.8–4.9)
Alkaline Phosphatase: 75 [IU]/L (ref 44–121)
BUN/Creatinine Ratio: 20 (ref 9–23)
BUN: 14 mg/dL (ref 6–24)
Bilirubin Total: 0.3 mg/dL (ref 0.0–1.2)
CO2: 25 mmol/L (ref 20–29)
Calcium: 9.2 mg/dL (ref 8.7–10.2)
Chloride: 104 mmol/L (ref 96–106)
Creatinine, Ser: 0.7 mg/dL (ref 0.57–1.00)
Globulin, Total: 2.5 g/dL (ref 1.5–4.5)
Glucose: 81 mg/dL (ref 70–99)
Potassium: 4.1 mmol/L (ref 3.5–5.2)
Sodium: 142 mmol/L (ref 134–144)
Total Protein: 6.7 g/dL (ref 6.0–8.5)
eGFR: 103 mL/min/{1.73_m2} (ref >59)

## 2023-03-19 LAB — CBC WITH DIFFERENTIAL/PLATELET
Basophils Absolute: 0 10*3/uL (ref 0.0–0.2)
Basos: 0 %
EOS (ABSOLUTE): 0.1 10*3/uL (ref 0.0–0.4)
Eos: 1 %
Hematocrit: 39.1 % (ref 34.0–46.6)
Hemoglobin: 12.2 g/dL (ref 11.1–15.9)
Immature Grans (Abs): 0 10*3/uL (ref 0.0–0.1)
Immature Granulocytes: 0 %
Lymphocytes Absolute: 2 10*3/uL (ref 0.7–3.1)
Lymphs: 29 %
MCH: 26.1 pg — ABNORMAL LOW (ref 26.6–33.0)
MCHC: 31.2 g/dL — ABNORMAL LOW (ref 31.5–35.7)
MCV: 84 fL (ref 79–97)
Monocytes Absolute: 0.5 10*3/uL (ref 0.1–0.9)
Monocytes: 8 %
Neutrophils Absolute: 4.3 10*3/uL (ref 1.4–7.0)
Neutrophils: 62 %
Platelets: 338 10*3/uL (ref 150–450)
RBC: 4.67 x10E6/uL (ref 3.77–5.28)
RDW: 14 % (ref 11.7–15.4)
WBC: 7 10*3/uL (ref 3.4–10.8)

## 2023-03-20 ENCOUNTER — Encounter: Payer: Self-pay | Admitting: Neurology

## 2023-03-20 ENCOUNTER — Other Ambulatory Visit (HOSPITAL_COMMUNITY): Payer: Self-pay

## 2023-03-20 NOTE — Progress Notes (Signed)
Chart reviewed, agree above plan ?

## 2023-03-24 ENCOUNTER — Telehealth: Payer: Self-pay

## 2023-03-24 NOTE — Telephone Encounter (Signed)
*  [facility]  Pharmacy Patient Advocate Encounter   Received notification from CoverMyMeds that prior authorization for Ubrelvy 50MG  tablets  is required/requested.   Insurance verification completed.   The patient is insured through [facility] .   Per test claim: PA required; PA submitted to above mentioned insurance via CoverMyMeds Key/confirmation #/EOC [id number] Status is pending

## 2023-03-27 NOTE — Telephone Encounter (Signed)
Received a request via fax for additional information-faxed completed form along with clinicals to [phone].

## 2023-03-31 ENCOUNTER — Other Ambulatory Visit (HOSPITAL_COMMUNITY): Payer: Self-pay

## 2023-03-31 NOTE — Telephone Encounter (Signed)
Pharmacy Patient Advocate Encounter  Received notification from [facility] that Prior Authorization for Ubrelvy 50MG  tablets has been APPROVED from 03/24/2023 to 03/23/2024. Ran test claim, Copay is $0.00. This test claim was processed through [facility]- copay amounts may vary at other pharmacies due to pharmacy/plan contracts, or as the patient moves through the different stages of their insurance plan.   PA #/Case ID/Reference #: [id number]

## 2023-04-10 ENCOUNTER — Ambulatory Visit: Payer: BC Managed Care – PPO | Admitting: Neurology

## 2023-05-06 ENCOUNTER — Ambulatory Visit
Admission: RE | Admit: 2023-05-06 | Discharge: 2023-05-06 | Disposition: A | Discharge status: Home / Self Care | Payer: BC Managed Care – PPO | Source: Ambulatory Visit | Attending: Family Medicine | Admitting: Family Medicine

## 2023-05-06 ENCOUNTER — Encounter: Payer: Self-pay | Admitting: Neurology

## 2023-05-06 DIAGNOSIS — R921 Mammographic calcification found on diagnostic imaging of breast: Secondary | ICD-10-CM

## 2023-05-21 ENCOUNTER — Telehealth: Payer: Self-pay

## 2023-05-21 ENCOUNTER — Other Ambulatory Visit (HOSPITAL_COMMUNITY): Payer: Self-pay

## 2023-05-21 NOTE — Telephone Encounter (Signed)
 I was asked to do a PA for Ajovy -however per test claim refill is too soon-Last filled 05/01/2023, next fill available on 05/31/2023. There is a current PA on file good thru 06/14/2023/ She can get one more fill before PA is needed. I did set a reminder to submit PA after the week of the 15th.

## 2023-05-21 NOTE — Telephone Encounter (Signed)
 Noted! Thank you

## 2023-06-03 NOTE — Telephone Encounter (Signed)
Pt has called from the pharmacy stating she is being told her Ajovy can not be filled and that she needs to call provider. Please call pt on this refill request.

## 2023-06-03 NOTE — Telephone Encounter (Signed)
[facility] Cannot refill due  need active prescription on file. We would need a new prescription. Pharmacist checked through their system and found there they already have a prescription. She apologized for taking up our time.

## 2023-06-03 NOTE — Telephone Encounter (Signed)
 Noted

## 2023-06-30 ENCOUNTER — Telehealth: Payer: Self-pay

## 2023-06-30 ENCOUNTER — Other Ambulatory Visit (HOSPITAL_COMMUNITY): Payer: Self-pay

## 2023-06-30 NOTE — Telephone Encounter (Signed)
 Pharmacy Patient Advocate Encounter   Received notification from Patient Advice Request messages that prior authorization for AJOVY (fremanezumab-vfrm) injection 225MG /1.5ML auto-injectors is required/requested.   Insurance verification completed.   The patient is insured through [facility] .   Per test claim: PA required; PA submitted to above mentioned insurance via CoverMyMeds Key/confirmation #/EOC [id number] Status is pending

## 2023-07-01 ENCOUNTER — Other Ambulatory Visit (HOSPITAL_COMMUNITY): Payer: Self-pay

## 2023-07-01 NOTE — Telephone Encounter (Signed)
 Pharmacy Patient Advocate Encounter  Received notification from [facility] that Prior Authorization for AJOVY (fremanezumab-vfrm) injection 225MG /1.5ML auto-injectors has been APPROVED from 06/30/2023 to 06/29/2024. Unable to obtain price due to refill too soon rejection, last fill date 06/04/2023 next available fill date4/26/2025   PA #/Case ID/Reference #: PA Case ID #: [id number]

## 2023-07-13 ENCOUNTER — Encounter: Payer: Self-pay | Admitting: Neurology

## 2023-07-15 ENCOUNTER — Telehealth: Payer: Self-pay

## 2023-07-15 NOTE — Telephone Encounter (Signed)
Letter sent and printed.

## 2023-09-04 ENCOUNTER — Encounter: Payer: Self-pay | Admitting: Neurology

## 2023-09-15 ENCOUNTER — Encounter: Payer: Self-pay | Admitting: Neurology

## 2023-09-15 ENCOUNTER — Telehealth: Payer: Self-pay

## 2023-09-15 ENCOUNTER — Ambulatory Visit (INDEPENDENT_AMBULATORY_CARE_PROVIDER_SITE_OTHER): Payer: BC Managed Care – PPO | Admitting: Neurology

## 2023-09-15 VITALS — BP 143/93 | HR 69 | Resp 16 | Ht 66.0 in | Wt 195.5 lb

## 2023-09-15 DIAGNOSIS — R531 Weakness: Secondary | ICD-10-CM

## 2023-09-15 DIAGNOSIS — G35 Multiple sclerosis: Secondary | ICD-10-CM

## 2023-09-15 DIAGNOSIS — G43009 Migraine without aura, not intractable, without status migrainosus: Secondary | ICD-10-CM | POA: Diagnosis not present

## 2023-09-15 DIAGNOSIS — Z79899 Other long term (current) drug therapy: Secondary | ICD-10-CM

## 2023-09-15 DIAGNOSIS — R269 Unspecified abnormalities of gait and mobility: Secondary | ICD-10-CM

## 2023-09-15 DIAGNOSIS — R5383 Other fatigue: Secondary | ICD-10-CM

## 2023-09-15 NOTE — Telephone Encounter (Signed)
 Faxed completed/signed Briumvi start form to [employer] Patient Support at [phone]. Received fax confirmation.signed order given to [facility]

## 2023-09-15 NOTE — Progress Notes (Addendum)
 ASSESSMENT AND PLAN 53 y.o. year old female with history of relapsing remitting multiple sclerosis.     Has been on ocrelizumab since 2020, no clear clinical flareup since, but complains of recurrent fatigue, worsening gait abnormality at the end of infusion cycle, she remains independent in her daily activity, can walk short distance without assistant, but need cane for longer distance  She has been treated with Plegridy 2015-2016, Catarina Cline Jan 2017-2020, Ocrelizumab from Oct 2020  Discussed with patient options, she would like to try Briumvi, related information discussed with patient, also provide pamphlet  Laboratory evaluation for baseline  Also repeat MRI of the brain with without contrast Chronic migraine  Well-managed with Ajovy , Ubrelvy  as needed Fatigue  Taking Provigil  as needed     DIAGNOSTIC DATA (LABS, IMAGING, TESTING) - I reviewed patient records, labs, notes, testing and imaging myself where available.    HISTORY  Dana Duke is a 53 year old female, follow-up for relapsing remitting multiple sclerosis, primary care physician is Dr. Cleora Daft, Sol Duos, MD   Since 2011 she noticed gradual onset slow worsening gait difficulty, she tends to drag her foot across the floor, stiff, has difficulty going upstairs, because of knee pain, she suffered a history of right ankle fracture in 2010, seems to dragging her right foot across the floor more, also complains urinary urgency, no bowel incontinence   MRI at [facility] in May 7th 2015, without contrast   MRI cervical spine (without) demonstrating at least 4 spinal cord T2 hyperintensities at cervico-medullary junction, C2, C3-4 and C6.  MRI brain (without) demonstrating multiple (> 20) round and ovoid, periventricular, callosal and peri-callosal, subcortical, juxtacortical, pontine and cerebellar T2 hyperintensities. Some of these are perpendicular to the lateral ventricles on axial and sagittal views. Some of these are  hypointense on T1 views. Findings suspicious for demyelinating disease   CSF showed 5 oligoclonal banding, elevated IgG index   Extensive laboratory evaluations showed normal or negative CBC, CMP, Lyme titer, ANA, NMO, TSH, B12, folic acid, RPR. Positive varicellar zoster antibody   The visual evoked response test above was within normal limits on the left, with prolongation of the P100 latency on the right.    JC virus antibody was 0.15 negative in 10/18/2013   MRI of brain, and cervical spine with contrast, showed no contrast enhancement, there was no lesions noticed in MRI thoracic spine,   She started Plegridy since July 2015,  began full dose injection since Sept 15 2015. Complains of injection site reaction, lasting 2-4 weeks, she only has mild flu like illness     She was started on prednisone   tapering since October 16,2015 for worsening gait abnormality, fall,  tolerating the medication well, seems to have mild improvement in her gait difficulty   She also complains of frequent headaches, consistent with migraines, previously tried nortriptyline  20 mg at night as preventive medication    Around 2016, she began to noticed increased lower extremity spasticity, gait abnormality, also developed urinary frequency, urgency,    MRI of the brain with and without contrast in November 2016:  abnormal MRI of the brain with and without contrast showing T2/FLAIR hyperintense foci at the cervicomedullary junction and within the pons, midbrain and cerebellum in the posterior fossa. Additionally, there are many T2/FLAIR hyperintense foci in the cerebral hemispheres, predominantly in the periventricular white matter.  The foci are consistent with chronic demyelinating plaques from multiple sclerosis.     None of the foci appears to be acute and there are  no enhancing lesions.    abnormal MRI of the cervical spine showing T2 hyperintense foci within the spinal cord at the cervicomedullary junction, C2,  C3, C5C6 and C6 as detailed above.   Additional foci are noted within the pons.  None of the foci appears to be acute. No significant degenerative changes are noted.   The lesions are consistent with chronic demyelinating plaques as would be seen in multiple sclerosis. The findings are similar to the MRI report from 08/19/2013.     She started Tysarbri infusion since January 2017,    MRI of the brain without contrast in November 2017:Abnormal MRI scan of the brain with and without contrast showing multiple periventricular, periatrial, subcortical and brainstem white matter hyperintensities consistent with chronic demyelinating disease. No enhancing lesions are noted. Overall no significant change compared with previous MRI scan dated 03/01/2015   Over the years, she has increased gait abnormality gait abnormality, failed to respond well to Ampyra ,    MRI cervical spine 01/02/2018, demyelinating plaque C2, 3, C5-6 and 7 level, no change compared to MRI in 2016   MRI of the brain with without contrast few small round periventricular subcortical, pontine, and cerebellar demyelinating plaque, no acute findings no change compared to MRI in 2018   Laboratory evaluation showed negative anti-Tysarbri antibody,    Started ocrelizumab since fall of  2020   MRI of the brain without contrast February 12, 2019, multiple T2/flair hyperintensity foci in the brainstem, spinal cord, cerebellum, cerebral hemisphere, in a pattern, and configuration, consistent with chronic demyelinating plaque associated with multiple sclerosis.  There was no change compared to previous MRI in November 2019.  No contrast-enhancement   UPDATE Jan 7th 2021: She came to clinic earlier than expected for worsening migraine headaches, she had 7 days of migraine around Christmas time, had recurrent migraine again over the past 4 days, failed multiple home dose of Imitrex , today she still complains 6 out of 10 right temporal area pounding  headache, worsening with movement, light noise sensitivity, with mild nausea   Her headache failed to improve with 50 mg  Ubrevyl sample, but much improved after she was given IV Depacon,,along with 10 mg of Compazine  Toradol 30 mg   UPDATE December 05 2020: She had right eye pain, was seen by ophthalmology today, diagnosed was right optic neuritis, but she denies visual change, was giving a steroid Dosepak H,  She still complains of increased migraine headache over the past 2 months, almost daily basis, aimovig  monthly does not help,   Continue complains worsening gait abnormality, has worsening bowel and bladder incontinence, is seen by urologist, pelvic physical therapy,   Also complains of increased to fatigue,  UPDATE October 08 2022: She is accompanied by her husband at today's visit, overall stable, gait abnormality but no worsening, recently reviewed MRI of the brain and cervical spine from 2022, extensive cervical cord involvement, stable,  Tolerating ocrelizumab infusion, last infusion was in January 2024,  US  abdomen in June 2024 for evaluation of right abdominal pain 1.No acute intra-abdominal pathology.  2.  Hepatic steatosis.  3.  Benign-appearing subcentimeter right renal cyst.   UPDATE September 15 2023: She is accompanied by her husband at today's clinical visit, no flareup over the past few months  Personally reviewed MRI of the brain and cervical spine with without contrast in July 2024, stable supratentorium chronic MS lesions, also evidence of involvement at cervical medullary junction, C3, 7 and 6  She continues to have gait abnormality,  fatigue, especially at each ocrelizumab infusion cycle, before next infusion, noticed increased difficulty  Her migraine is under good control taking Ajovy  monthly, Ubrelvy  as needed, she remains active, has urinary urgency, occasional incontinence, frequent UTI    PHYSICAL EXAM     09/15/2023    2:57 PM 09/15/2023    2:53 PM 03/18/2023     2:42 PM  Vitals with BMI  Height 5\' 6"   5\' 6"   Weight 195 lbs 8 oz  199 lbs 10 oz  BMI 31.57  32.23  Systolic 143 128 161  Diastolic 93 87 84  Pulse 69  65    Body mass index is 31.96 kg/m.   PHYSICAL EXAMNIATION:  Gen: NAD, conversant, well nourised, well groomed                     Cardiovascular: Regular rate rhythm, no peripheral edema, warm, nontender. Eyes: Conjunctivae clear without exudates or hemorrhage Pulmonary: Clear to auscultation bilaterally   NEUROLOGICAL EXAM:  MENTAL STATUS: Speech/cognition: Awake, alert oriented to history taking and casual conversation  CRANIAL NERVES: CN II: Visual fields are full to confrontation.  Pupils are round equal and briskly reactive to light. CN III, IV, VI: extraocular movement are normal. No ptosis. CN V: Facial sensation is intact to soft touch CN VII: Face is symmetric with normal eye closure and smile. CN VIII: Hearing is normal to casual conversation CN XI: Head turning and shoulder shrug are intact   MOTOR: Mild to moderate spasticity of lower extremity.  Mild bilateral hip flexion weakness  REFLEXES: Reflexes are 2 and symmetric at the biceps, triceps, knees, and ankles. Plantar responses are flexor.  SENSORY: Intact to light touch  COORDINATION: Rapid alternating movements and fine finger movements are intact. There is no dysmetria on finger-to-nose and heel-knee-shin.    GAIT/STANCE: Push-up to get up from seated position, stiff, unsteady, dragging left more than right    REVIEW OF SYSTEMS: Out of a complete 14 system review of symptoms, the patient complains only of the following symptoms, and all other reviewed systems are negative.  See HPI  ALLERGIES: Allergies  Allergen Reactions   Sulpiride Nausea Only    Other Reaction(s): vomiting   Sulfa  Antibiotics Nausea And Vomiting   Sulfacetamide Sodium     Other Reaction(s): GI Intolerance    HOME MEDICATIONS: Outpatient Medications Prior to Visit   Medication Sig Dispense Refill   acetaminophen  (TYLENOL ) 325 MG tablet Take 2 tablets (650 mg total) by mouth every 6 (six) hours as needed (for mild pain, fever, or headache).     Ascorbic Acid (VITA-C PO) Take by mouth.     Cholecalciferol  25 MCG (1000 UT) tablet Take 1,000 Units by mouth daily.     Fremanezumab -vfrm (AJOVY ) 225 MG/1.5ML SOAJ 1.36ml into the skin once a month 4.5 mL 3   methenamine (HIPREX) 1 g tablet Take 1 g by mouth 2 (two) times daily.     modafinil  (PROVIGIL ) 200 MG tablet Take 1 tablet (200 mg total) by mouth daily. 90 tablet 1   ocrelizumab (OCREVUS) 300 MG/10ML injection Inject into the vein.     PREMARIN vaginal cream Place 1 applicator vaginally 2 (two) times a week.     promethazine (PHENERGAN) 12.5 MG tablet Take 12.5 mg by mouth every 6 (six) hours as needed.     Red Yeast Rice Extract (RED YEAST RICE PO) Take by mouth.     Ubrogepant  (UBRELVY ) 50 MG TABS TAKE ONE TABLET BY  MOUTH AT ONSET OF MIGRAINE. IF SYMPTOMS PERSIST, A SECOND DOSE MAY BE TAKEN IN 2 HOURS 36 tablet 3   Facility-Administered Medications Prior to Visit  Medication Dose Route Frequency Provider Last Rate Last Admin   gadopentetate dimeglumine  (MAGNEVIST ) injection 20 mL  20 mL Intravenous Once PRN Phebe Brasil, MD        PAST MEDICAL HISTORY: Past Medical History:  Diagnosis Date   Ankle fracture, right    History of hysterectomy    Knee joint dislocation    bilateral   Migraines    Multiple sclerosis (HCC)     PAST SURGICAL HISTORY: Past Surgical History:  Procedure Laterality Date   ABDOMINAL HYSTERECTOMY     CESAREAN SECTION     MYOMECTOMY      FAMILY HISTORY: Family History  Problem Relation Age of Onset   Diabetes Mother    Osteoarthritis Other    Heart disease Other    Rheumatic fever Other    Hypertension Other     SOCIAL HISTORY: Social History   Socioeconomic History   Marital status: Married    Spouse name: Merchant navy officer   Number of children: 1   Years of  education: college   Highest education level: Not on file  Occupational History    Employer: [employer]    Comment: Does not work  Tobacco Use   Smoking status: Never   Smokeless tobacco: Never  Vaping Use   Vaping status: Never Used  Substance and Sexual Activity   Alcohol use: No   Drug use: No   Sexual activity: Yes    Birth control/protection: Surgical  Other Topics Concern   Not on file  Social History Narrative   Patient is married. Dana Duke).    Patient is unemployed.   Education- College education   Right handed.   Caffeine- None   Social Drivers of Health   Financial Resource Strain: Low Risk  (08/15/2023)   Received from [facility]   Overall Financial Resource Strain (CARDIA)    Difficulty of Paying Living Expenses: Not very hard  Food Insecurity: No Food Insecurity (08/15/2023)   Received from [facility]   Hunger Vital Sign    Worried About Running Out of Food in the Last Year: Never true    Ran Out of Food in the Last Year: Never true  Transportation Needs: No Transportation Needs (08/15/2023)   Received from [facility] - Transportation    Lack of Transportation (Medical): No    Lack of Transportation (Non-Medical): No  Physical Activity: Insufficiently Active (08/15/2023)   Received from [facility]   Exercise Vital Sign    Days of Exercise per Week: 2 days    Minutes of Exercise per Session: 10 min  Stress: No Stress Concern Present (08/15/2023)   Received from [facility] - Occupational Stress Questionnaire    Feeling of Stress : Not at all  Social Connections: Moderately Integrated (08/15/2023)   Received from [facility]   Social Network    How would you rate your social network (family, work, friends)?: Adequate participation with social networks  Intimate Partner Violence: Not At Risk (08/15/2023)   Received from [facility]   HITS    Over the last 12 months how often did your  partner physically hurt you?: Never    Over the last 12 months how often did your partner insult you or talk down to you?: Never  Over the last 12 months how often did your partner threaten you with physical harm?: Never    Over the last 12 months how often did your partner scream or curse at you?: Never    Phebe Brasil, M.D. Ph.D.  [facility] [address] [place], [place] [address] Phone: [phone] Fax:      [phone]

## 2023-09-17 LAB — COMPREHENSIVE METABOLIC PANEL WITH GFR
ALT: 16 IU/L (ref 0–32)
AST: 16 IU/L (ref 0–40)
Albumin: 4.5 g/dL (ref 3.8–4.9)
Alkaline Phosphatase: 75 IU/L (ref 44–121)
BUN/Creatinine Ratio: 22 (ref 9–23)
BUN: 18 mg/dL (ref 6–24)
Bilirubin Total: <0.2 mg/dL (ref 0.0–1.2)
CO2: 23 mmol/L (ref 20–29)
Calcium: 9.7 mg/dL (ref 8.7–10.2)
Chloride: 103 mmol/L (ref 96–106)
Creatinine, Ser: 0.83 mg/dL (ref 0.57–1.00)
Globulin, Total: 2.6 g/dL (ref 1.5–4.5)
Glucose: 102 mg/dL — ABNORMAL HIGH (ref 70–99)
Potassium: 4.7 mmol/L (ref 3.5–5.2)
Sodium: 138 mmol/L (ref 134–144)
Total Protein: 7.1 g/dL (ref 6.0–8.5)
eGFR: 84 mL/min/{1.73_m2} (ref >59)

## 2023-09-17 LAB — IGG, IGA, IGM
IgA/Immunoglobulin A, Serum: 133 mg/dL (ref 87–352)
IgG (Immunoglobin G), Serum: 1377 mg/dL (ref 586–1602)
IgM (Immunoglobulin M), Srm: 92 mg/dL (ref 26–217)

## 2023-09-17 LAB — CD20 B CELLS
% CD19-B Cells: 0 % — ABNORMAL LOW (ref 4.6–22.1)
% CD20-B Cells: 0 % — ABNORMAL LOW (ref 5.0–22.3)

## 2023-09-17 LAB — CBC WITH DIFFERENTIAL/PLATELET
Basophils Absolute: 0 10*3/uL (ref 0.0–0.2)
Basos: 0 %
EOS (ABSOLUTE): 0.1 10*3/uL (ref 0.0–0.4)
Eos: 1 %
Hematocrit: 41.4 % (ref 34.0–46.6)
Hemoglobin: 13 g/dL (ref 11.1–15.9)
Immature Grans (Abs): 0 10*3/uL (ref 0.0–0.1)
Immature Granulocytes: 0 %
Lymphocytes Absolute: 2.4 10*3/uL (ref 0.7–3.1)
Lymphs: 31 %
MCH: 26.4 pg — ABNORMAL LOW (ref 26.6–33.0)
MCHC: 31.4 g/dL — ABNORMAL LOW (ref 31.5–35.7)
MCV: 84 fL (ref 79–97)
Monocytes Absolute: 0.5 10*3/uL (ref 0.1–0.9)
Monocytes: 6 %
Neutrophils Absolute: 4.9 10*3/uL (ref 1.4–7.0)
Neutrophils: 62 %
Platelets: 450 10*3/uL (ref 150–450)
RBC: 4.92 x10E6/uL (ref 3.77–5.28)
RDW: 13.8 % (ref 11.7–15.4)
WBC: 8 10*3/uL (ref 3.4–10.8)

## 2023-09-17 LAB — VITAMIN D 25 HYDROXY (VIT D DEFICIENCY, FRACTURES): Vit D, 25-Hydroxy: 26.7 ng/mL — ABNORMAL LOW (ref 30.0–100.0)

## 2023-09-17 LAB — HGB A1C W/O EAG: Hgb A1c MFr Bld: 6.6 % — ABNORMAL HIGH (ref 4.8–5.6)

## 2023-09-17 LAB — TSH: TSH: 1.59 u[IU]/mL (ref 0.450–4.500)

## 2023-09-18 ENCOUNTER — Ambulatory Visit: Payer: Self-pay | Admitting: Neurology

## 2023-09-23 ENCOUNTER — Ambulatory Visit (INDEPENDENT_AMBULATORY_CARE_PROVIDER_SITE_OTHER)

## 2023-09-23 DIAGNOSIS — R531 Weakness: Secondary | ICD-10-CM | POA: Diagnosis not present

## 2023-09-23 DIAGNOSIS — R269 Unspecified abnormalities of gait and mobility: Secondary | ICD-10-CM

## 2023-09-23 DIAGNOSIS — R5383 Other fatigue: Secondary | ICD-10-CM

## 2023-09-23 DIAGNOSIS — G43009 Migraine without aura, not intractable, without status migrainosus: Secondary | ICD-10-CM

## 2023-09-23 DIAGNOSIS — Z79899 Other long term (current) drug therapy: Secondary | ICD-10-CM

## 2023-09-23 DIAGNOSIS — G35 Multiple sclerosis: Secondary | ICD-10-CM

## 2023-09-23 MED ORDER — GADOBENATE DIMEGLUMINE 529 MG/ML IV SOLN
18.0000 mL | Freq: Once | INTRAVENOUS | Status: AC | PRN
  Administered 2023-09-23: 18 mL via INTRAVENOUS

## 2024-02-17 ENCOUNTER — Other Ambulatory Visit: Payer: Self-pay | Admitting: Neurology

## 2024-02-23 ENCOUNTER — Other Ambulatory Visit: Payer: Self-pay

## 2024-02-23 MED ORDER — UBRELVY 50 MG PO TABS: ORAL_TABLET | ORAL | 3 refills | Status: AC

## 2024-02-24 ENCOUNTER — Telehealth: Payer: Self-pay | Admitting: Neurology

## 2024-02-24 NOTE — Telephone Encounter (Signed)
 Call to stacy, gave update ICD code G35.A. She states she has to have in writing and has already composed an email to send to [facility].

## 2024-02-24 NOTE — Telephone Encounter (Signed)
 Stacy with [facility] called to speak to Nurse about Pt medication  need updates Glade states  she need  missing ICD  10 coded on medication order for Pt. As of 10-1- 25  all  ICD 10  has changes for MS ,  Callback-[phone]

## 2024-03-24 ENCOUNTER — Telehealth: Payer: Self-pay | Admitting: Pharmacist

## 2024-03-24 NOTE — Telephone Encounter (Signed)
 Pharmacy Patient Advocate Encounter   Received notification from CoverMyMeds that prior authorization for Ubrelvy  50MG  tablets is required/requested.   Insurance verification completed.   The patient is insured through [facility].   Per test claim: PA required; PA submitted to above mentioned insurance via Latent Key/confirmation #/EOC [id number] Status is pending

## 2024-03-24 NOTE — Telephone Encounter (Signed)
 Pharmacy Patient Advocate Encounter  Received notification from [facility] that Prior Authorization for UBRELVY  50 MG PO TABS has been APPROVED from 03/24/2024 to 03/24/2025   PA #/Case ID/Reference #: [id number]

## 2024-04-19 ENCOUNTER — Other Ambulatory Visit: Payer: Self-pay | Admitting: Neurology

## 2024-04-20 NOTE — Telephone Encounter (Signed)
 Last seen on 09/15/23 Follow up scheduled on 05/20/24

## 2024-04-21 ENCOUNTER — Telehealth: Payer: Self-pay

## 2024-04-21 ENCOUNTER — Other Ambulatory Visit (HOSPITAL_COMMUNITY): Payer: Self-pay

## 2024-04-21 NOTE — Telephone Encounter (Signed)
 Pharmacy Patient Advocate Encounter   Received notification from Fax that prior authorization for Ubrelvy  is required/requested.   Insurance verification completed.   The patient is insured through [facility].   Per test claim: PA required; PA started via CoverMyMeds. KEY [id number] . Waiting for clinical questions to populate.

## 2024-04-21 NOTE — Telephone Encounter (Signed)
 Clinical questions have been answered and PA submitted. PA currently Pending. Please be advised that most companies allow up to 30 days to make a decision. We will advise when a determination has been made, or follow up in 1 week.   Please reach out to our team, Rx Prior Auth Pool, if you haven't heard back in a week.

## 2024-04-23 NOTE — Telephone Encounter (Signed)
 [name]

## 2024-05-06 ENCOUNTER — Other Ambulatory Visit: Payer: Self-pay | Admitting: Family Medicine

## 2024-05-06 DIAGNOSIS — R921 Mammographic calcification found on diagnostic imaging of breast: Secondary | ICD-10-CM

## 2024-05-19 NOTE — Progress Notes (Unsigned)
 "  ASSESSMENT AND PLAN 54 y.o. year old female with history of relapsing remitting multiple sclerosis.     Has been on ocrelizumab since 2020, no clear clinical flareup since, but complains of recurrent fatigue, worsening gait abnormality at the end of infusion cycle, she remains independent in her daily activity, can walk short distance without assistant, but need cane for longer distance  She has been treated with Plegridy 2015-2016, Meliton Jan 2017-2020, Ocrelizumab from Oct 2020  Discussed with patient options, she would like to try Briumvi, related information discussed with patient, also provide pamphlet  Laboratory evaluation for baseline  Also repeat MRI of the brain with without contrast Chronic migraine  Well-managed with Ajovy , Ubrelvy  as needed Fatigue  Taking Provigil  as needed     DIAGNOSTIC DATA (LABS, IMAGING, TESTING) - I reviewed patient records, labs, notes, testing and imaging myself where available.    HISTORY  Dana Duke is a 54 year old female, follow-up for relapsing remitting multiple sclerosis, primary care physician is Dr. Leonce, Shera, MD   Since 2011 she noticed gradual onset slow worsening gait difficulty, she tends to drag her foot across the floor, stiff, has difficulty going upstairs, because of knee pain, she suffered a history of right ankle fracture in 2010, seems to dragging her right foot across the floor more, also complains urinary urgency, no bowel incontinence   MRI at [facility] in May 7th 2015, without contrast   MRI cervical spine (without) demonstrating at least 4 spinal cord T2 hyperintensities at cervico-medullary junction, C2, C3-4 and C6.  MRI brain (without) demonstrating multiple (> 20) round and ovoid, periventricular, callosal and peri-callosal, subcortical, juxtacortical, pontine and cerebellar T2 hyperintensities. Some of these are perpendicular to the lateral ventricles on axial and sagittal views. Some of these are  hypointense on T1 views. Findings suspicious for demyelinating disease   CSF showed 5 oligoclonal banding, elevated IgG index   Extensive laboratory evaluations showed normal or negative CBC, CMP, Lyme titer, ANA, NMO, TSH, B12, folic acid, RPR. Positive varicellar zoster antibody   The visual evoked response test above was within normal limits on the left, with prolongation of the P100 latency on the right.    JC virus antibody was 0.15 negative in 10/18/2013   MRI of brain, and cervical spine with contrast, showed no contrast enhancement, there was no lesions noticed in MRI thoracic spine,   She started Plegridy since July 2015,  began full dose injection since Sept 15 2015. Complains of injection site reaction, lasting 2-4 weeks, she only has mild flu like illness     She was started on prednisone   tapering since October 16,2015 for worsening gait abnormality, fall,  tolerating the medication well, seems to have mild improvement in her gait difficulty   She also complains of frequent headaches, consistent with migraines, previously tried nortriptyline  20 mg at night as preventive medication    Around 2016, she began to noticed increased lower extremity spasticity, gait abnormality, also developed urinary frequency, urgency,    MRI of the brain with and without contrast in November 2016:  abnormal MRI of the brain with and without contrast showing T2/FLAIR hyperintense foci at the cervicomedullary junction and within the pons, midbrain and cerebellum in the posterior fossa. Additionally, there are many T2/FLAIR hyperintense foci in the cerebral hemispheres, predominantly in the periventricular white matter.  The foci are consistent with chronic demyelinating plaques from multiple sclerosis.     None of the foci appears to be acute and there  are no enhancing lesions.    abnormal MRI of the cervical spine showing T2 hyperintense foci within the spinal cord at the cervicomedullary junction, C2,  C3, C5C6 and C6 as detailed above.   Additional foci are noted within the pons.  None of the foci appears to be acute. No significant degenerative changes are noted.   The lesions are consistent with chronic demyelinating plaques as would be seen in multiple sclerosis. The findings are similar to the MRI report from 08/19/2013.     She started Tysarbri infusion since January 2017,    MRI of the brain without contrast in November 2017:Abnormal MRI scan of the brain with and without contrast showing multiple periventricular, periatrial, subcortical and brainstem white matter hyperintensities consistent with chronic demyelinating disease. No enhancing lesions are noted. Overall no significant change compared with previous MRI scan dated 03/01/2015   Over the years, she has increased gait abnormality gait abnormality, failed to respond well to Ampyra ,    MRI cervical spine 01/02/2018, demyelinating plaque C2, 3, C5-6 and 7 level, no change compared to MRI in 2016   MRI of the brain with without contrast few small round periventricular subcortical, pontine, and cerebellar demyelinating plaque, no acute findings no change compared to MRI in 2018   Laboratory evaluation showed negative anti-Tysarbri antibody,    Started ocrelizumab since fall of  2020   MRI of the brain without contrast February 12, 2019, multiple T2/flair hyperintensity foci in the brainstem, spinal cord, cerebellum, cerebral hemisphere, in a pattern, and configuration, consistent with chronic demyelinating plaque associated with multiple sclerosis.  There was no change compared to previous MRI in November 2019.  No contrast-enhancement   UPDATE Jan 7th 2021: She came to clinic earlier than expected for worsening migraine headaches, she had 7 days of migraine around Christmas time, had recurrent migraine again over the past 4 days, failed multiple home dose of Imitrex , today she still complains 6 out of 10 right temporal area pounding  headache, worsening with movement, light noise sensitivity, with mild nausea   Her headache failed to improve with 50 mg  Ubrevyl sample, but much improved after she was given IV Depacon,,along with 10 mg of Compazine  Toradol 30 mg   UPDATE December 05 2020: She had right eye pain, was seen by ophthalmology today, diagnosed was right optic neuritis, but she denies visual change, was giving a steroid Dosepak H,  She still complains of increased migraine headache over the past 2 months, almost daily basis, aimovig  monthly does not help,   Continue complains worsening gait abnormality, has worsening bowel and bladder incontinence, is seen by urologist, pelvic physical therapy,   Also complains of increased to fatigue,  UPDATE October 08 2022: She is accompanied by her husband at today's visit, overall stable, gait abnormality but no worsening, recently reviewed MRI of the brain and cervical spine from 2022, extensive cervical cord involvement, stable,  Tolerating ocrelizumab infusion, last infusion was in January 2024,  US  abdomen in June 2024 for evaluation of right abdominal pain 1.No acute intra-abdominal pathology.  2.  Hepatic steatosis.  3.  Benign-appearing subcentimeter right renal cyst.   UPDATE September 15 2023: She is accompanied by her husband at today's clinical visit, no flareup over the past few months  Personally reviewed MRI of the brain and cervical spine with without contrast in July 2024, stable supratentorium chronic MS lesions, also evidence of involvement at cervical medullary junction, C3, 7 and 6  She continues to have gait  abnormality, fatigue, especially at each ocrelizumab infusion cycle, before next infusion, noticed increased difficulty  Her migraine is under good control taking Ajovy  monthly, Ubrelvy  as needed, she remains active, has urinary urgency, occasional incontinence, frequent UTI   Update 05/20/24 SS:    PHYSICAL EXAM     09/15/2023    2:57 PM 09/15/2023     2:53 PM 03/18/2023    2:42 PM  Vitals with BMI  Height 5' 6  5' 6  Weight 195 lbs 8 oz  199 lbs 10 oz  BMI 31.57  32.23  Systolic 143 128 867  Diastolic 93 87 84  Pulse 69  65    Body mass index is 31.96 kg/m.   PHYSICAL EXAMNIATION:  Gen: NAD, conversant, well nourised, well groomed                     Cardiovascular: Regular rate rhythm, no peripheral edema, warm, nontender. Eyes: Conjunctivae clear without exudates or hemorrhage Pulmonary: Clear to auscultation bilaterally   NEUROLOGICAL EXAM:  MENTAL STATUS: Speech/cognition: Awake, alert oriented to history taking and casual conversation  CRANIAL NERVES: CN II: Visual fields are full to confrontation.  Pupils are round equal and briskly reactive to light. CN III, IV, VI: extraocular movement are normal. No ptosis. CN V: Facial sensation is intact to soft touch CN VII: Face is symmetric with normal eye closure and smile. CN VIII: Hearing is normal to casual conversation CN XI: Head turning and shoulder shrug are intact   MOTOR: Mild to moderate spasticity of lower extremity.  Mild bilateral hip flexion weakness  REFLEXES: Reflexes are 2 and symmetric at the biceps, triceps, knees, and ankles. Plantar responses are flexor.  SENSORY: Intact to light touch  COORDINATION: Rapid alternating movements and fine finger movements are intact. There is no dysmetria on finger-to-nose and heel-knee-shin.    GAIT/STANCE: Push-up to get up from seated position, stiff, unsteady, dragging left more than right    REVIEW OF SYSTEMS: Out of a complete 14 system review of symptoms, the patient complains only of the following symptoms, and all other reviewed systems are negative.  See HPI  ALLERGIES: Allergies  Allergen Reactions   Sulpiride Nausea Only    Other Reaction(s): vomiting   Sulfa  Antibiotics Nausea And Vomiting   Sulfacetamide Sodium     Other Reaction(s): GI Intolerance    HOME MEDICATIONS: Outpatient  Medications Prior to Visit  Medication Sig Dispense Refill   acetaminophen  (TYLENOL ) 325 MG tablet Take 2 tablets (650 mg total) by mouth every 6 (six) hours as needed (for mild pain, fever, or headache).     Ascorbic Acid (VITA-C PO) Take by mouth.     Cholecalciferol  25 MCG (1000 UT) tablet Take 1,000 Units by mouth daily.     Fremanezumab -vfrm (AJOVY ) 225 MG/1.5ML SOAJ INJECT 1 & 1/2 (ONE & ONE-HALF) ML  INTO THE SKIN ONCE EVERY MONTH 6 mL 0   methenamine (HIPREX) 1 g tablet Take 1 g by mouth 2 (two) times daily.     modafinil  (PROVIGIL ) 200 MG tablet Take 1 tablet (200 mg total) by mouth daily. 90 tablet 1   ocrelizumab (OCREVUS) 300 MG/10ML injection Inject into the vein.     PREMARIN vaginal cream Place 1 applicator vaginally 2 (two) times a week.     promethazine (PHENERGAN) 12.5 MG tablet Take 12.5 mg by mouth every 6 (six) hours as needed.     Red Yeast Rice Extract (RED YEAST RICE PO) Take by  mouth.     Ubrogepant  (UBRELVY ) 50 MG TABS TAKE ONE TABLET BY MOUTH AT ONSET OF MIGRAINE. IF SYMPTOMS PERSIST, A SECOND DOSE MAY BE TAKEN IN 2 HOURS 36 tablet 3   Facility-Administered Medications Prior to Visit  Medication Dose Route Frequency Provider Last Rate Last Admin   gadopentetate dimeglumine  (MAGNEVIST ) injection 20 mL  20 mL Intravenous Once PRN Onita Duos, MD        PAST MEDICAL HISTORY: Past Medical History:  Diagnosis Date   Ankle fracture, right    History of hysterectomy    Knee joint dislocation    bilateral   Migraines    Multiple sclerosis     PAST SURGICAL HISTORY: Past Surgical History:  Procedure Laterality Date   ABDOMINAL HYSTERECTOMY     CESAREAN SECTION     MYOMECTOMY      FAMILY HISTORY: Family History  Problem Relation Age of Onset   Diabetes Mother    Osteoarthritis Other    Heart disease Other    Rheumatic fever Other    Hypertension Other     SOCIAL HISTORY: Social History   Socioeconomic History   Marital status: Married    Spouse  name: [employer]chant [employer] Officer   Number of children: 1   Years of education: college   Highest education level: Not on file  Occupational History    Employer: [employer]    Comment: Does not work  Tobacco Use   Smoking status: Never   Smokeless tobacco: Never  Vaping Use   Vaping status: Never Used  Substance and Sexual Activity   Alcohol use: No   Drug use: No   Sexual activity: Yes    Birth control/protection: Surgical  Other Topics Concern   Not on file  Social History Narrative   Patient is married. Alonso).    Patient is unemployed.   Education- College education   Right handed.   Caffeine- None   Social Drivers of Health   Tobacco Use: Low Risk (03/30/2024)   Received from [facility]   Patient History    Smoking Tobacco Use: Never    Smokeless Tobacco Use: Never    Passive Exposure: Never  Financial Resource Strain: Low Risk (03/27/2024)   Received from [facility]   Overall Financial Resource Strain (CARDIA)    How hard is it for you to pay for the very basics like food, housing, medical care, and heating?: Not hard at all  Food Insecurity: No Food Insecurity (03/27/2024)   Received from [facility]   Epic    Within the past 12 months, you worried that your food would run out before you got the money to buy more.: Never true    Within the past 12 months, the food you bought just didn't last and you didn't have money to get more.: Never true  Transportation Needs: No Transportation Needs (03/27/2024)   Received from [facility]    In the past 12 months, has lack of transportation kept you from medical appointments or from getting medications?: No    In the past 12 months, has lack of transportation kept you from meetings, work, or from getting things needed for daily living?: No  Physical Activity: Sufficiently Active (03/27/2024)   Received from [facility]   Exercise Vital Sign    On average, how many days per week do you engage in moderate to strenuous  exercise (like a brisk walk)?: 3 days    On average, how many minutes  do you engage in exercise at this level?: 60 min  Stress: No Stress Concern Present (03/27/2024)   Received from [facility] - Occupational Stress Questionnaire    Do you feel stress - tense, restless, nervous, or anxious, or unable to sleep at night because your mind is troubled all the time - these days?: Not at all  Social Connections: Socially Integrated (03/27/2024)   Received from [facility]   Social Network    How would you rate your social network (family, work, friends)?: Good participation with social networks  Intimate Partner Violence: Not At Risk (03/27/2024)   Received from [facility]   HITS    Over the last 12 months how often did your partner physically hurt you?: Never    Over the last 12 months how often did your partner insult you or talk down to you?: Never    Over the last 12 months how often did your partner threaten you with physical harm?: Never    Over the last 12 months how often did your partner scream or curse at you?: Never  Depression (PHQ2-9): Not on file  Alcohol Screen: Not on file  Housing: Low Risk (03/27/2024)   Received from [facility]    In the last 12 months, was there a time when you were not able to pay the mortgage or rent on time?: No    In the past 12 months, how many times have you moved where you were living?: 0    At any time in the past 12 months, were you homeless or living in a shelter (including now)?: No  Utilities: Not At Risk (03/27/2024)   Received from [facility]    In the past 12 months has the electric, gas, oil, or water company threatened to shut off services in your home?: No  Health Literacy: Not on file    Lauraine Born, SCHARLENE, DNP  [facility] [address], [address] [place], [place] [address] [phone]    "

## 2024-05-20 ENCOUNTER — Encounter: Payer: Self-pay | Admitting: Neurology

## 2024-05-20 ENCOUNTER — Ambulatory Visit: Admitting: Neurology

## 2024-05-20 VITALS — BP 123/84 | HR 73 | Ht 67.0 in | Wt 196.5 lb

## 2024-05-20 DIAGNOSIS — R5383 Other fatigue: Secondary | ICD-10-CM

## 2024-05-20 DIAGNOSIS — G43009 Migraine without aura, not intractable, without status migrainosus: Secondary | ICD-10-CM

## 2024-05-20 DIAGNOSIS — G35A Relapsing-remitting multiple sclerosis: Secondary | ICD-10-CM

## 2024-05-20 MED ORDER — AJOVY 225 MG/1.5ML ~~LOC~~ SOAJ: SUBCUTANEOUS | 3 refills | Status: AC

## 2024-05-20 NOTE — Patient Instructions (Signed)
 Great to see you today! Continue current medications Check labs

## 2024-05-21 ENCOUNTER — Ambulatory Visit: Payer: Self-pay | Admitting: Neurology

## 2024-05-21 LAB — CBC WITH DIFFERENTIAL/PLATELET
Basophils Absolute: 0 10*3/uL (ref 0.0–0.2)
Basos: 0 %
EOS (ABSOLUTE): 0.1 10*3/uL (ref 0.0–0.4)
Eos: 1 %
Hematocrit: 40.2 % (ref 34.0–46.6)
Hemoglobin: 13 g/dL (ref 11.1–15.9)
Immature Grans (Abs): 0 10*3/uL (ref 0.0–0.1)
Immature Granulocytes: 0 %
Lymphocytes Absolute: 2.2 10*3/uL (ref 0.7–3.1)
Lymphs: 30 %
MCH: 27.1 pg (ref 26.6–33.0)
MCHC: 32.3 g/dL (ref 31.5–35.7)
MCV: 84 fL (ref 79–97)
Monocytes Absolute: 0.6 10*3/uL (ref 0.1–0.9)
Monocytes: 8 %
Neutrophils Absolute: 4.4 10*3/uL (ref 1.4–7.0)
Neutrophils: 61 %
Platelets: 279 10*3/uL (ref 150–450)
RBC: 4.8 x10E6/uL (ref 3.77–5.28)
RDW: 13.9 % (ref 11.7–15.4)
WBC: 7.3 10*3/uL (ref 3.4–10.8)

## 2024-05-21 LAB — IGG, IGA, IGM
IgG (Immunoglobin G), Serum: 1288 mg/dL (ref 586–1602)
IgM (Immunoglobulin M), Srm: 87 mg/dL (ref 26–217)
Immunoglobulin A, (IgA) QN, Serum: 119 mg/dL (ref 87–352)

## 2024-05-21 LAB — COMPREHENSIVE METABOLIC PANEL WITH GFR
ALT: 11 [IU]/L (ref 0–32)
AST: 13 [IU]/L (ref 0–40)
Albumin: 4.6 g/dL (ref 3.8–4.9)
Alkaline Phosphatase: 65 [IU]/L (ref 49–135)
BUN/Creatinine Ratio: 15 (ref 9–23)
BUN: 11 mg/dL (ref 6–24)
Bilirubin Total: 0.3 mg/dL (ref 0.0–1.2)
CO2: 23 mmol/L (ref 20–29)
Calcium: 9.6 mg/dL (ref 8.7–10.2)
Chloride: 106 mmol/L (ref 96–106)
Creatinine, Ser: 0.72 mg/dL (ref 0.57–1.00)
Globulin, Total: 2.6 g/dL (ref 1.5–4.5)
Glucose: 89 mg/dL (ref 70–99)
Potassium: 4.4 mmol/L (ref 3.5–5.2)
Sodium: 143 mmol/L (ref 134–144)
Total Protein: 7.2 g/dL (ref 6.0–8.5)
eGFR: 99 mL/min/{1.73_m2}

## 2024-05-26 ENCOUNTER — Encounter

## 2024-07-14 ENCOUNTER — Encounter

## 2024-12-16 ENCOUNTER — Ambulatory Visit: Admitting: Neurology
